# Patient Record
Sex: Female | Born: 1970 | Race: Black or African American | Hispanic: No | Marital: Single | Smoking: Never smoker
Health system: Southern US, Community
[De-identification: ages and names within clinical notes are randomized; demographics above are authoritative.]

## PROBLEM LIST (undated history)

## (undated) DIAGNOSIS — R569 Unspecified convulsions: Secondary | ICD-10-CM

## (undated) DIAGNOSIS — K219 Gastro-esophageal reflux disease without esophagitis: Secondary | ICD-10-CM

## (undated) DIAGNOSIS — I071 Rheumatic tricuspid insufficiency: Secondary | ICD-10-CM

## (undated) DIAGNOSIS — D649 Anemia, unspecified: Secondary | ICD-10-CM

## (undated) DIAGNOSIS — T7840XA Allergy, unspecified, initial encounter: Secondary | ICD-10-CM

## (undated) DIAGNOSIS — G40909 Epilepsy, unspecified, not intractable, without status epilepticus: Secondary | ICD-10-CM

## (undated) DIAGNOSIS — D72829 Elevated white blood cell count, unspecified: Principal | ICD-10-CM

## (undated) DIAGNOSIS — B019 Varicella without complication: Secondary | ICD-10-CM

## (undated) DIAGNOSIS — D563 Thalassemia minor: Secondary | ICD-10-CM

## (undated) HISTORY — PX: DILATION AND CURETTAGE OF UTERUS: SHX78

## (undated) HISTORY — DX: Varicella without complication: B01.9

## (undated) HISTORY — DX: Allergy, unspecified, initial encounter: T78.40XA

## (undated) HISTORY — DX: Anemia, unspecified: D64.9

## (undated) HISTORY — DX: Epilepsy, unspecified, not intractable, without status epilepticus: G40.909

## (undated) HISTORY — DX: Elevated white blood cell count, unspecified: D72.829

## (undated) HISTORY — PX: VARICOSE VEIN SURGERY: SHX832

## (undated) HISTORY — DX: Unspecified convulsions: R56.9

## (undated) HISTORY — DX: Rheumatic tricuspid insufficiency: I07.1

## (undated) HISTORY — DX: Thalassemia minor: D56.3

---

## 2000-09-26 ENCOUNTER — Other Ambulatory Visit: Admission: RE | Admit: 2000-09-26 | Discharge: 2000-09-26 | Payer: Self-pay | Admitting: Obstetrics and Gynecology

## 2000-12-19 ENCOUNTER — Encounter: Payer: Self-pay | Admitting: Obstetrics and Gynecology

## 2000-12-19 ENCOUNTER — Ambulatory Visit (HOSPITAL_COMMUNITY): Admission: RE | Admit: 2000-12-19 | Discharge: 2000-12-19 | Payer: Self-pay | Admitting: Obstetrics and Gynecology

## 2001-05-01 ENCOUNTER — Inpatient Hospital Stay (HOSPITAL_COMMUNITY): Admission: AD | Admit: 2001-05-01 | Discharge: 2001-05-01 | Payer: Self-pay | Admitting: Obstetrics and Gynecology

## 2001-05-05 ENCOUNTER — Encounter (HOSPITAL_COMMUNITY): Admission: RE | Admit: 2001-05-05 | Discharge: 2001-05-05 | Payer: Self-pay | Admitting: Obstetrics and Gynecology

## 2001-05-06 ENCOUNTER — Inpatient Hospital Stay (HOSPITAL_COMMUNITY): Admission: AD | Admit: 2001-05-06 | Discharge: 2001-05-08 | Payer: Self-pay | Admitting: Obstetrics and Gynecology

## 2001-07-23 ENCOUNTER — Other Ambulatory Visit: Admission: RE | Admit: 2001-07-23 | Discharge: 2001-07-23 | Payer: Self-pay | Admitting: Obstetrics and Gynecology

## 2002-11-27 ENCOUNTER — Other Ambulatory Visit: Admission: RE | Admit: 2002-11-27 | Discharge: 2002-11-27 | Payer: Self-pay | Admitting: Obstetrics and Gynecology

## 2004-04-28 ENCOUNTER — Other Ambulatory Visit: Admission: RE | Admit: 2004-04-28 | Discharge: 2004-04-28 | Payer: Self-pay | Admitting: Obstetrics and Gynecology

## 2005-05-25 ENCOUNTER — Other Ambulatory Visit: Admission: RE | Admit: 2005-05-25 | Discharge: 2005-05-25 | Payer: Self-pay | Admitting: Obstetrics and Gynecology

## 2006-08-09 ENCOUNTER — Ambulatory Visit (HOSPITAL_COMMUNITY): Admission: RE | Admit: 2006-08-09 | Discharge: 2006-08-09 | Payer: Self-pay | Admitting: Obstetrics and Gynecology

## 2006-08-09 ENCOUNTER — Encounter (INDEPENDENT_AMBULATORY_CARE_PROVIDER_SITE_OTHER): Payer: Self-pay | Admitting: *Deleted

## 2010-08-15 ENCOUNTER — Emergency Department (HOSPITAL_COMMUNITY)
Admission: EM | Admit: 2010-08-15 | Discharge: 2010-08-15 | Payer: Self-pay | Source: Home / Self Care | Admitting: Emergency Medicine

## 2010-11-14 LAB — URINALYSIS, ROUTINE W REFLEX MICROSCOPIC
Bilirubin Urine: NEGATIVE
Glucose, UA: NEGATIVE mg/dL
Hgb urine dipstick: NEGATIVE
Ketones, ur: NEGATIVE mg/dL
Nitrite: NEGATIVE
Protein, ur: NEGATIVE mg/dL
Specific Gravity, Urine: 1.025 (ref 1.005–1.030)
Urobilinogen, UA: 1 mg/dL (ref 0.0–1.0)
pH: 7.5 (ref 5.0–8.0)

## 2010-11-14 LAB — COMPREHENSIVE METABOLIC PANEL
ALT: 13 U/L (ref 0–35)
Albumin: 3.4 g/dL — ABNORMAL LOW (ref 3.5–5.2)
Alkaline Phosphatase: 65 U/L (ref 39–117)
Glucose, Bld: 95 mg/dL (ref 70–99)
Potassium: 4.1 mEq/L (ref 3.5–5.1)
Sodium: 137 mEq/L (ref 135–145)
Total Protein: 6.9 g/dL (ref 6.0–8.3)

## 2010-11-14 LAB — CBC
HCT: 34.6 % — ABNORMAL LOW (ref 36.0–46.0)
Hemoglobin: 11.7 g/dL — ABNORMAL LOW (ref 12.0–15.0)
MCH: 20.9 pg — ABNORMAL LOW (ref 26.0–34.0)
MCHC: 33.8 g/dL (ref 30.0–36.0)
MCV: 61.8 fL — ABNORMAL LOW (ref 78.0–100.0)
Platelets: 298 10*3/uL (ref 150–400)
RBC: 5.6 MIL/uL — ABNORMAL HIGH (ref 3.87–5.11)
RDW: 14.9 % (ref 11.5–15.5)
WBC: 10.5 10*3/uL (ref 4.0–10.5)

## 2010-11-14 LAB — RAPID URINE DRUG SCREEN, HOSP PERFORMED
Amphetamines: NOT DETECTED
Barbiturates: NOT DETECTED
Benzodiazepines: NOT DETECTED
Cocaine: NOT DETECTED
Opiates: NOT DETECTED
Tetrahydrocannabinol: NOT DETECTED

## 2010-11-14 LAB — URINE MICROSCOPIC-ADD ON

## 2010-11-14 LAB — CK TOTAL AND CKMB (NOT AT ARMC): CK, MB: 1.6 ng/mL (ref 0.3–4.0)

## 2010-11-14 LAB — LIPASE, BLOOD: Lipase: 18 U/L (ref 11–59)

## 2011-01-19 NOTE — H&P (Signed)
Laurie Monroe, Laurie Monroe                ACCOUNT NO.:  [id number]   MEDICAL RECORD NO.:  [id number]          PATIENT TYPE:  AMB   LOCATION:  SDC                           FACILITY:  [facility]   PHYSICIAN:  Zenaida Niece, M.D.DATE OF BIRTH:  13-Aug-1971   DATE OF ADMISSION:  08/09/2006  DATE OF DISCHARGE:                              HISTORY & PHYSICAL   CHIEF COMPLAINT:  Menorrhagia.   HISTORY OF PRESENT ILLNESS:  This is a 40 year old female para 1-0-0-1  whom I saw for an annual exam in October of this year.  At that time she  had been off of birth control pills for approximately 6 months and was  having periods every 1-2 months that were very heavy both on and off the  birth control pill.  Physical exam revealed a mid-planer uterus that was  possibly slightly enlarged.  She had a pelvic ultrasound performed on  November 2 which revealed a small fibroid and a probable endometrial  polyp.  She wishes to preserve fertility and is being admitted for  hysteroscopy with D&C and possible resection of a polyp.   PAST OBSTETRICAL HISTORY:  One vaginal delivery at term in 2002.   PAST MEDICAL HISTORY:  History of seizures as a child.   PAST SURGICAL HISTORY:  Negative.   ALLERGIES:  No known drug allergies.   CURRENT MEDICATIONS:  None.   SOCIAL HISTORY:  She is single and denies alcohol, tobacco or drug use.   FAMILY HISTORY:  No GYN or colon cancer.   REVIEW OF SYSTEMS:  She has normal bowel and bladder function.   PHYSICAL EXAMINATION:  VITAL SIGNS:  Weight is 231 pounds, blood  pressure is 110/80.  GENERAL:  This is a slightly-obese female in no acute distress.  NECK:  Supple without lymphadenopathy or thyromegaly.  LUNGS:  Clear to auscultation.  HEART:  Regular rate and rhythm without murmur.  ABDOMEN:  Soft, nontender, nondistended without palpable masses.  EXTREMITIES:  Have no edema and are nontender.  PELVIC:  External genitalia has no lesions.  On speculum exam, the  cervix  is normal and on bimanual exam the uterus is upper limits of  normal size, mid-planar, and she has no adnexal masses.   ASSESSMENT:  Menorrhagia and a possible endometrial polyp.  Ultrasound  findings have been discussed with the patient.  Again, she wishes to  preserve fertility.  Risks of surgery including bleeding, infection and  damage to surrounding organs, as well as uterine perforation have been  discussed and she understands.   PLAN:  Admit the patient on the day of surgery for hysteroscopy with  dilation and curettage and possible resection.      Zenaida Niece, M.D.  Electronically Signed     TDM/MEDQ  D:  08/08/2006  T:  08/08/2006  Job:  [id number]

## 2011-01-19 NOTE — Op Note (Signed)
Laurie Monroe, Laurie Monroe                ACCOUNT NO.:  [id number]   MEDICAL RECORD NO.:  [id number]          PATIENT TYPE:  AMB   LOCATION:  SDC                           FACILITY:  [facility]   PHYSICIAN:  Zenaida Niece, M.D.DATE OF BIRTH:  08/19/1971   DATE OF PROCEDURE:  08/09/2006  DATE OF DISCHARGE:                               OPERATIVE REPORT   PREOPERATIVE DIAGNOSES:  1. Menorrhagia.  2. Possible endometrial lesion.   POSTOPERATIVE DIAGNOSES:  1. Menorrhagia.  2. Possible endometrial lesion.   PROCEDURE:  Hysteroscopy with dilation and curettage and resection of a  probable endometrial polyp.   SURGEON:  Zenaida Niece, M.D.   ANESTHESIA:  General with an LMA and paracervical block.   SPECIMENS:  Endometrial curettings and resection.   ESTIMATED BLOOD LOSS:  Minimal.   COMPLICATIONS:  None.   FINDINGS:  She had a slightly enlarged uterus and a cervix that was  easily dilated.  She had a normal endometrial cavity, except for a  lesion arising from the left fundus.   PROCEDURE IN DETAIL:  The patient was taken to the operating room and  placed in the dorsal supine position.  General anesthesia was induced  and she was placed in mobile stirrups.  Perineum and vagina were then  prepped and draped in the usual sterile fashion and bladder drained with  a latex-free catheter.  A Graves speculum was inserted into the vagina  and the anterior lip of the cervix grasped with a single-tooth  tenaculum.  Paracervical block was then performed with a total of 16 mL  of 2% plain lidocaine.  Uterus then sounded to 9 cm and the cervix was  easily dilated to a size 17 dilator.  The observer hysteroscope was  inserted and good visualization was achieved.  The patient is  menstruating, so there was some fresh blood in the uterus.  The right  side of the uterine cavity looked normal with a normal tubal ostium.  The left side was obscured by a possible lesion.  The hysteroscope was  removed and sharp curettage was performed with return of a moderate  amount of tissue and some roughness on the left side.  The hysteroscope  was reinserted and there still appears to be lesion on the left side.  The observer hysteroscope was removed and the cervix was further dilated  to a size 23 dilator.  The small resectoscope was then inserted and this  lesion on the left side was resected with a single loop with cutting  current.  It appeared once this was resected, that no further lesion  remained.  All pieces were removed with polyp forceps and with  curettage.  One more look with the hysteroscope revealed a normal  endometrial cavity.  The hysteroscope was removed.  The single-tooth  tenaculum was removed from the cervix and bleeding controlled with  pressure and silver nitrate.  All instruments were then removed from the  vagina.  The patient tolerated the procedure well and was taken to the  recovery room in stable condition.  Counts were correct x2.  Zenaida Niece, M.D.  Electronically Signed     TDM/MEDQ  D:  08/09/2006  T:  08/09/2006  Job:  [id number]

## 2011-01-19 NOTE — Discharge Summary (Signed)
[facility]  Patient:    Laurie Monroe, Laurie Monroe Visit Number: [id number] MRN: [id number]          Service Type: OBS Location: 910A [id number] [id number] Attending Physician:  Oliver Pila Dictated by:   Zenaida Niece, M.D. Admit Date:  05/06/2001 Disc. Date: 05/08/01                             Discharge Summary  ADMISSION DIAGNOSES:          1. Intrauterine pregnancy at 39 weeks.                               2. Preeclampsia.  DISCHARGE DIAGNOSES:          1. Intrauterine pregnancy at 39 weeks.                               2. Preeclampsia.                               3. Meconium stained amniotic fluid.  PROCEDURES:                   Spontaneous vaginal delivery.  COMPLICATIONS:                None.  CONSULTATIONS:                None.  HISTORY AND PHYSICAL:         This is a 40 year old black female, gravida 1, para 0, with an EGA of [redacted] weeks with a due date of September 6 by an LMP consistent with an ultrasound, who presents for augmentation of labor. Blood pressures elevated to the 150s/90s and she now has new 2+ proteinuria consistent with preeclampsia. She has normal PIH labs and is asymptomatic. She has irregular contractions and the cervix on admission was 3+/70/-2.  PRENATAL LABORATORY DATA:     Blood type is O positive with a negative antibody screen, sickle screen negative, RPR nonreactive, rubella immune, hepatitis B surface antigen negative, HIV negative, gonorrhea negative, Chlamydia negative, and group B strep is negative.  PAST MEDICAL HISTORY:         History of seizures as a child. She has been without seizures or medications for greater than 10 years.  PHYSICAL EXAMINATION:         Blood pressures are 150s/90s. Cervix in labor and delivery is 5 cm dilated, 90% effaced with a bulging bag and Dr. Senaida Ores performed artificial rupture of membranes which revealed moderate meconium-stained fluid.  HOSPITAL COURSE:               The patient was admitted as above and had magnesium sulfate started for seizure prophylaxis. She received an amnioinfusion for the meconium-stained fluid. She progressed through labor and reached complete and pushed well, and on the evening of September 3 had a SVD of a vigorous female infant over a second-degree laceration. Apgars were 8 and 9. Weight was 6 pounds 1 ounce. DeLee suction was performed after delivery. Estimated blood loss was 450 cc. There was a mild uterine atony which responded to bimanual massage. Her laceration was repaired with 2-0 Vicryl. She was continued on her magnesium sulfate postpartum, had good urine output and blood pressures  remained stable. Magnesium sulfate was discontinued on the afternoon of September 4. This was postpartum day #1. On postpartum day #2, she again had stable blood pressures, was asymptomatic, was breast and bottle feeding her infant, and was felt to be stable enough for discharge home. Predelivery hemoglobin was 11.5, postdelivery was 10.2.  CONDITION ON DISCHARGE:       Stable.  DISPOSITION:                  Discharge to home.  DISCHARGE INSTRUCTIONS:       Regular diet. Activity is pelvic rest.  DISCHARGE FOLLOWUP:           The patient is to follow up in six weeks.  DISCHARGE MEDICATIONS:        1. Ibuprofen 800 mg p.o. q.6h. p.r.n.                               2. Colace 100 mg p.o. b.i.d. for stool softener. Dictated by:   Zenaida Niece, M.D. Attending Physician:  Oliver Pila DD:  05/08/01 TD:  05/08/01 Job: [id number]

## 2012-07-08 ENCOUNTER — Ambulatory Visit (INDEPENDENT_AMBULATORY_CARE_PROVIDER_SITE_OTHER): Payer: PRIVATE HEALTH INSURANCE | Admitting: Family Medicine

## 2012-07-08 VITALS — BP 108/78 | HR 85 | Temp 98.5°F | Resp 16 | Ht 66.0 in | Wt 222.0 lb

## 2012-07-08 DIAGNOSIS — IMO0002 Reserved for concepts with insufficient information to code with codable children: Secondary | ICD-10-CM

## 2012-07-08 DIAGNOSIS — R569 Unspecified convulsions: Secondary | ICD-10-CM | POA: Insufficient documentation

## 2012-07-08 DIAGNOSIS — R350 Frequency of micturition: Secondary | ICD-10-CM

## 2012-07-08 DIAGNOSIS — S39012A Strain of muscle, fascia and tendon of lower back, initial encounter: Secondary | ICD-10-CM

## 2012-07-08 LAB — POCT URINALYSIS DIPSTICK
Bilirubin, UA: NEGATIVE
Blood, UA: NEGATIVE
Glucose, UA: NEGATIVE
Ketones, UA: NEGATIVE
Leukocytes, UA: NEGATIVE
Nitrite, UA: NEGATIVE
Protein, UA: NEGATIVE
Spec Grav, UA: 1.025
Urobilinogen, UA: 0.2
pH, UA: 6

## 2012-07-08 LAB — POCT UA - MICROSCOPIC ONLY
Bacteria, U Microscopic: NEGATIVE
Casts, Ur, LPF, POC: NEGATIVE
Crystals, Ur, HPF, POC: NEGATIVE
Mucus, UA: NEGATIVE
Yeast, UA: NEGATIVE

## 2012-07-08 MED ORDER — MELOXICAM 7.5 MG PO TABS: 7.5000 mg | ORAL_TABLET | Freq: Every day | ORAL | Status: DC

## 2012-07-08 NOTE — Progress Notes (Signed)
[facility] [address] [place] [address] [phone]  Date:  07/08/2012   Name:  Laurie Monroe   DOB:  10/26/1970   MRN:  [id number]  PCP:  No primary provider on file.    Chief Complaint: Back Pain and Urinary Frequency   History of Present Illness:  Laurie Monroe is a 41 y.o. very pleasant female patient who presents with the following:  She has noted intermittent sharp pains in her right lower back- this has been going on for a couple of weeks.  She has not noted any dysuria or hematuria.   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  She does recall lifting a very heavy patient recently and thinks this could be the source of her pain.   She does not note any exacerbation with any particular poisition or activity.   She has tried ibuprofen so far.   She can have pain run down her right leg sometimes, but no numbness, no tingling, no weakness, no bowel or bladder control issues LMP 2 weeks ago  She had urine checked due to her back pain- however she does not feel that she has a UTI.  She has noted that she sometimes has to urinate frequently but this is not new  There is no problem list on file for this patient.   Past Medical History  Diagnosis Date  . Anemia   . Seizures     No past surgical history on file.  History  Substance Use Topics  . Smoking status: Never Smoker   . Smokeless tobacco: Not on file  . Alcohol Use: No    Family History  Problem Relation Age of Onset  . Hypertension Mother   . Cancer Father   . Hypertension Father   . Hypertension Brother     Allergies no known allergies  Medication list has been reviewed and updated.  No current outpatient prescriptions on file prior to visit.    Review of Systems:  As per HPI- otherwise negative. No abdominal pain   Physical Examination: Filed Vitals:   07/08/12 1405  BP: 108/78  Pulse: 85  Temp: 98.5 F (36.9  C)  Resp: 16   Filed Vitals:   07/08/12 1405  Height: 5\' 6"  (1.676 m)  Weight: 222 lb (100.699 kg)   Body mass index is 35.83 kg/(m^2). Ideal Body Weight: Weight in (lb) to have BMI = 25: 154.6   GEN: WDWN, NAD, Non-toxic, A & O x 3, overweight HEENT: Atraumatic, Normocephalic. Neck supple. No masses, No LAD. Ears and Nose: No external deformity. CV: RRR, No M/G/R. No JVD. No thrill. No extra heart sounds. PULM: CTA B, no wheezes, crackles, rhonchi. No retractions. No resp. distress. No accessory muscle use. EXTR: No c/c/e NEURO Normal gait.  PSYCH: Normally interactive. Conversant. Not depressed or anxious appearing.  Calm demeanor.  Back: she does not have any tenderness currently.  She indicates her lumbar muscles as the area where she has been tender Legs: negative SLR bilaterally, normal strength, sensation and DTR  Results for orders placed in visit on 07/08/12  POCT UA - MICROSCOPIC ONLY      Component Value Range   WBC, Ur, HPF, POC 0-2     RBC, urine, microscopic 0-4     Bacteria, U Microscopic neg     Mucus, UA neg     Epithelial cells, urine per  micros 3-6     Crystals, Ur, HPF, POC neg     Casts, Ur, LPF, POC neg     Yeast, UA neg    POCT URINALYSIS DIPSTICK      Component Value Range   Color, UA yellow     Clarity, UA cloudy     Glucose, UA neg     Bilirubin, UA neg     Ketones, UA neg     Spec Grav, UA 1.025     Blood, UA neg     pH, UA 6.0     Protein, UA neg     Urobilinogen, UA 0.2     Nitrite, UA neg     Leukocytes, UA Negative      Assessment and Plan: 1. Urinary frequency  POCT UA - Microscopic Only, POCT urinalysis dipstick  2. Back strain  meloxicam (MOBIC) 7.5 MG tablet   See patient instructions for more details.     Abbe Amsterdam, MD

## 2012-07-08 NOTE — Patient Instructions (Addendum)
I think that you are strained your back.  Use the mobic (instead of ibuprofen) once a day as needed.  Let me know if you are not better in the next few days

## 2013-02-04 ENCOUNTER — Ambulatory Visit (INDEPENDENT_AMBULATORY_CARE_PROVIDER_SITE_OTHER): Payer: PRIVATE HEALTH INSURANCE | Admitting: Emergency Medicine

## 2013-02-04 VITALS — BP 120/78 | HR 67 | Temp 98.6°F | Resp 16 | Ht 60.0 in | Wt 232.2 lb

## 2013-02-04 DIAGNOSIS — R059 Cough, unspecified: Secondary | ICD-10-CM

## 2013-02-04 DIAGNOSIS — J039 Acute tonsillitis, unspecified: Secondary | ICD-10-CM

## 2013-02-04 DIAGNOSIS — R05 Cough: Secondary | ICD-10-CM

## 2013-02-04 MED ORDER — BENZONATATE 100 MG PO CAPS: 100.0000 mg | ORAL_CAPSULE | Freq: Three times a day (TID) | ORAL | Status: DC | PRN

## 2013-02-04 NOTE — Patient Instructions (Addendum)
Sore Throat A sore throat is pain, burning, irritation, or scratchiness of the throat. There is often pain or tenderness when swallowing or talking. A sore throat may be accompanied by other symptoms, such as coughing, sneezing, fever, and swollen neck glands. A sore throat is often the first sign of another sickness, such as a cold, flu, strep throat, or mononucleosis (commonly known as mono). Most sore throats go away without medical treatment. CAUSES  The most common causes of a sore throat include:  A viral infection, such as a cold, flu, or mono.  A bacterial infection, such as strep throat, tonsillitis, or whooping cough.  Seasonal allergies.  Dryness in the air.  Irritants, such as smoke or pollution.  Gastroesophageal reflux disease (GERD). HOME CARE INSTRUCTIONS   Only take over-the-counter medicines as directed by your caregiver.  Drink enough fluids to keep your urine clear or pale yellow.  Rest as needed.  Try using throat sprays, lozenges, or sucking on hard candy to ease any pain (if older than 4 years or as directed).  Sip warm liquids, such as broth, herbal tea, or warm water with honey to relieve pain temporarily. You may also eat or drink cold or frozen liquids such as frozen ice pops.  Gargle with salt water (mix 1 tsp salt with 8 oz of water).  Do not smoke and avoid secondhand smoke.  Put a cool-mist humidifier in your bedroom at night to moisten the air. You can also turn on a hot shower and sit in the bathroom with the door closed for 5 10 minutes. SEEK IMMEDIATE MEDICAL CARE IF:  You have difficulty breathing.  You are unable to swallow fluids, soft foods, or your saliva.  You have increased swelling in the throat.  Your sore throat does not get better in 7 days.  You have nausea and vomiting.  You have a fever or persistent symptoms for more than 2 3 days.  You have a fever and your symptoms suddenly get worse. MAKE SURE YOU:   Understand  these instructions.  Will watch your condition.  Will get help right away if you are not doing well or get worse. Document Released: 09/27/2004 Document Revised: 08/06/2012 Document Reviewed: 04/27/2012 Exit[employer] Patient Information 2014 [employer].  

## 2013-02-04 NOTE — Progress Notes (Signed)
  Subjective:    Patient ID: Laurie Monroe, female    DOB: November 25, 1970, 42 y.o.   MRN: [id number]  HPI Pt presents to clinic today with a sore throat and cough that started Monday. Pt states cough is nonproductive but bothers her throat when she coughs. 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 Pt reports no fever/chills, abdominal pain, or nausea. Pt has not had any problems this year with seasonal allergies.   Review of Systems     Objective:   Physical Exam patient is alert and cooperative she is not in any distress. Her neck is supple. Her TMs are clear. She has a tonsillith midportion of the left tonsil. Her chest is clear to both auscultation and percussion. Heart regular rate no murmurs.  Results for orders placed in visit on 02/04/13  POCT RAPID STREP A (OFFICE)      Result Value Range   Rapid Strep A Screen Negative  Negative        Assessment & Plan:  Strep culture will be done. Placed on Tylenol Tessalon Perles symptomatic treat

## 2013-02-11 ENCOUNTER — Telehealth: Payer: Self-pay

## 2013-02-11 NOTE — Telephone Encounter (Signed)
PT STATES SHE WAS SEEN FOR A SORE THROAT AND COUGH. HER THROAT ISN'T SORE ANYMORE, BUT THE COUGH MEDICINE SHE WAS GIVEN ISN'T HELPING WOULD LIKE TO HAVE SOMETHING ELSE CALLED IN. PLEASE CALL [phone]    [facility] ON [address]

## 2013-02-12 MED ORDER — GUAIFENESIN-CODEINE 100-10 MG/5ML PO SOLN: ORAL | Status: DC

## 2013-02-12 NOTE — Telephone Encounter (Signed)
Ok to call in Robitussin [facility]. If cough worsens or persists she must RTC for further evaluation.

## 2013-02-13 NOTE — Telephone Encounter (Signed)
Faxed Rx and notified pt on VM 

## 2013-02-27 ENCOUNTER — Ambulatory Visit (INDEPENDENT_AMBULATORY_CARE_PROVIDER_SITE_OTHER): Payer: PRIVATE HEALTH INSURANCE | Admitting: Family Medicine

## 2013-02-27 VITALS — BP 108/72 | HR 89 | Temp 98.2°F | Resp 16 | Ht 66.0 in | Wt 229.0 lb

## 2013-02-27 DIAGNOSIS — J209 Acute bronchitis, unspecified: Secondary | ICD-10-CM

## 2013-02-27 MED ORDER — GUAIFENESIN-CODEINE 100-10 MG/5ML PO SOLN: ORAL | Status: DC

## 2013-02-27 MED ORDER — AZITHROMYCIN 250 MG PO TABS: ORAL_TABLET | ORAL | Status: DC

## 2013-02-27 NOTE — Progress Notes (Signed)
Patient ID: Laurie Monroe MRN: [id number], DOB: [birth year], 42 y.o. Date of Encounter: 02/27/2013, 4:57 PM  Primary Physician: No primary provider on file.  Chief Complaint:  Chief Complaint  Patient presents with  . Cough    was seen on June 4th- cough still not resolved    HPI: 42 y.o. year old female presents with a 25 day history of nasal congestion, post nasal drip, sore throat, and cough. Mild sinus pressure. Afebrile. No chills. Nasal congestion thick and green/yellow. Cough is productive of green/yellow sputum and not associated with time of day. Ears feel full, leading to sensation of muffled hearing. Has tried OTC cold preps without success. No GI complaints.   No sick contacts, recent antibiotics, or recent travels.   No leg trauma, sedentary periods, h/o cancer, or tobacco use.  No asthma  Past Medical History  Diagnosis Date  . Anemia   . Seizures   . Allergy      Home Meds: Prior to Admission medications   Medication Sig Start Date End Date Taking? Authorizing Provider  benzonatate (TESSALON) 100 MG capsule Take 1-2 capsules (100-200 mg total) by mouth 3 (three) times daily as needed for cough. 02/04/13   Collene Gobble, MD  guaiFENesin-codeine 100-10 MG/5ML syrup 1 TSP PO Q 4-6 HOURS PRN COUGH 02/12/13   Sondra Barges, PA-C  meloxicam (MOBIC) 7.5 MG tablet Take 1 tablet (7.5 mg total) by mouth daily. 07/08/12   Pearline Cables, MD    Allergies: No Known Allergies  History   Social History  . Marital Status: Single    Spouse Name: N/A    Number of Children: N/A  . Years of Education: N/A   Occupational History  . CNA    Social History Main Topics  . Smoking status: Never Smoker   . Smokeless tobacco: Not on file  . Alcohol Use: No  . Drug Use: No  . Sexually Active: Not on file   Other Topics Concern  . Not on file   Social History Narrative  . No narrative on file     Review of Systems: Constitutional: negative for chills, fever, night sweats  or weight changes Cardiovascular: negative for chest pain or palpitations Respiratory: negative for hemoptysis, wheezing, or shortness of breath Abdominal: negative for abdominal pain, nausea, vomiting or diarrhea Dermatological: negative for rash Neurologic: negative for headache   Physical Exam: Blood pressure 108/72, pulse 89, temperature 98.2 F (36.8 C), temperature source Oral, resp. rate 16, height 5\' 6"  (1.676 m), weight 229 lb (103.874 kg), last menstrual period 01/15/2013, SpO2 99.00%., Body mass index is 36.98 kg/(m^2). General: Well developed, well nourished, in no acute distress. Head: Normocephalic, atraumatic, eyes without discharge, sclera non-icteric, nares are congested. Bilateral auditory canals clear, TM's are without perforation, pearly grey with reflective cone of light bilaterally. No sinus TTP. Oral cavity moist, dentition normal. Posterior pharynx with post nasal drip and mild erythema. No peritonsillar abscess or tonsillar exudate. Neck: Supple. No thyromegaly. Full ROM. No lymphadenopathy. Lungs: Coarse breath sounds bilaterally without wheezes, rales, or rhonchi. Breathing is unlabored.  Heart: RRR with S1 S2. No murmurs, rubs, or gallops appreciated. Msk:  Strength and tone normal for age. Extremities: No clubbing or cyanosis. No edema. Neuro: Alert and oriented X 3. Moves all extremities spontaneously. CNII-XII grossly in tact. Psych:  Responds to questions appropriately with a normal affect.     ASSESSMENT AND PLAN:  42 y.o. year old female with bronchitis. Acute bronchitis -  Plan: azithromycin (ZITHROMAX Z-PAK) 250 MG tablet, guaiFENesin-codeine 100-10 MG/5ML syrup   - -Mucinex -Tylenol/Motrin prn -Rest/fluids -RTC precautions -RTC 3-5 days if no improvement  Signed, Elvina Sidle, MD 02/27/2013 4:57 PM

## 2013-03-09 ENCOUNTER — Encounter (HOSPITAL_BASED_OUTPATIENT_CLINIC_OR_DEPARTMENT_OTHER): Payer: Self-pay | Admitting: *Deleted

## 2013-03-09 ENCOUNTER — Emergency Department (HOSPITAL_BASED_OUTPATIENT_CLINIC_OR_DEPARTMENT_OTHER): Payer: PRIVATE HEALTH INSURANCE

## 2013-03-09 ENCOUNTER — Emergency Department (HOSPITAL_BASED_OUTPATIENT_CLINIC_OR_DEPARTMENT_OTHER)
Admission: EM | Admit: 2013-03-09 | Discharge: 2013-03-09 | Disposition: A | Discharge status: Home / Self Care | Payer: PRIVATE HEALTH INSURANCE | Attending: Emergency Medicine | Admitting: Emergency Medicine

## 2013-03-09 DIAGNOSIS — Z791 Long term (current) use of non-steroidal anti-inflammatories (NSAID): Secondary | ICD-10-CM | POA: Insufficient documentation

## 2013-03-09 DIAGNOSIS — J40 Bronchitis, not specified as acute or chronic: Secondary | ICD-10-CM

## 2013-03-09 DIAGNOSIS — Z862 Personal history of diseases of the blood and blood-forming organs and certain disorders involving the immune mechanism: Secondary | ICD-10-CM | POA: Insufficient documentation

## 2013-03-09 DIAGNOSIS — Z8669 Personal history of other diseases of the nervous system and sense organs: Secondary | ICD-10-CM | POA: Insufficient documentation

## 2013-03-09 MED ORDER — ALBUTEROL SULFATE HFA 108 (90 BASE) MCG/ACT IN AERS
2.0000 | INHALATION_SPRAY | RESPIRATORY_TRACT | Status: DC | PRN
  Administered 2013-03-09: 2 via RESPIRATORY_TRACT
  Filled 2013-03-09: qty 6.7

## 2013-03-09 NOTE — ED Provider Notes (Signed)
History  This chart was scribed for Ethelda Chick, MD by Ardeen Jourdain, ED Scribe. This patient was seen in room MH01/MH01 and the patient's care was started at 2149.  CSN: [id number] Arrival date & time 03/09/13  2059   First MD Initiated Contact with Patient 03/09/13 2149     Chief Complaint  Patient presents with  . Cough    Patient is a 42 y.o. female presenting with cough. The history is provided by the patient. No language interpreter was used.  Cough Duration:  4 weeks Timing:  Constant Progression:  Worsening Chronicity:  New Smoker: no   Relieved by:  Nothing Worsened by:  Nothing tried Ineffective treatments:  Cough suppressants and rest (Antibiotics) Associated symptoms: no chest pain, no chills, no diaphoresis, no ear fullness, no ear pain, no eye discharge, no fever, no headaches, no myalgias, no rash, no rhinorrhea, no shortness of breath, no sinus congestion, no sore throat, no weight loss and no wheezing     HPI Comments: Laurie Monroe is a 42 y.o. female who presents to the Emergency Department complaining of gradual onset, gradually worsening, constant cough that began 1 month ago. Pt states she was evaluated at [facility] in June and July and was prescribed a Z-pack and cough medication. She states she received the medication a week and a half ago. She denies any known CP, leg swelling, post nasal drip, fever, chills, nausea or emesis. She states the cough is aggravated by heat. She states the medications did not relieve her cough.   Past Medical History  Diagnosis Date  . Anemia   . Seizures   . Allergy    History reviewed. No pertinent past surgical history. Family History  Problem Relation Age of Onset  . Hypertension Mother   . Cancer Father   . Hypertension Father   . Hypertension Brother    History  Substance Use Topics  . Smoking status: Never Smoker   . Smokeless tobacco: Not on file  . Alcohol Use: No   No OB history available.   Review of  Systems  Constitutional: Negative for fever, chills, weight loss and diaphoresis.  HENT: Negative for ear pain, sore throat and rhinorrhea.   Eyes: Negative for discharge.  Respiratory: Positive for cough. Negative for shortness of breath and wheezing.   Cardiovascular: Negative for chest pain.  Musculoskeletal: Negative for myalgias.  Skin: Negative for rash.  Neurological: Negative for headaches.  All other systems reviewed and are negative.    Allergies  Review of patient's allergies indicates no known allergies.  Home Medications   Current Outpatient Rx  Name  Route  Sig  Dispense  Refill  . azithromycin (ZITHROMAX Z-PAK) 250 MG tablet      Take as directed on pack   6 tablet   0   . benzonatate (TESSALON) 100 MG capsule   Oral   Take 1-2 capsules (100-200 mg total) by mouth 3 (three) times daily as needed for cough.   40 capsule   0   . guaiFENesin-codeine 100-10 MG/5ML syrup      1 TSP PO Q 4-6 HOURS PRN COUGH   120 mL   0   . meloxicam (MOBIC) 7.5 MG tablet   Oral   Take 1 tablet (7.5 mg total) by mouth daily.   30 tablet   0    Triage Vitals: BP 129/67  Pulse 83  Temp(Src) 99.1 F (37.3 C) (Oral)  Resp 20  Wt 229 lb (103.874  kg)  BMI 36.98 kg/m2  SpO2 100%  Physical Exam  Nursing note and vitals reviewed. Constitutional: She is oriented to person, place, and time. She appears well-developed and well-nourished. No distress.  HENT:  Head: Normocephalic and atraumatic.  Mouth/Throat: Oropharynx is clear and moist. No oropharyngeal exudate.  Moist mucus membranes   Eyes: EOM are normal. Pupils are equal, round, and reactive to light.  Neck: Normal range of motion. Neck supple. No tracheal deviation present.  Cardiovascular: Normal rate, regular rhythm and normal heart sounds.  Exam reveals no gallop and no friction rub.   No murmur heard. Pulmonary/Chest: Effort normal and breath sounds normal. No respiratory distress. She has no wheezes. She has  no rales. She exhibits no tenderness.  Abdominal: Soft. Bowel sounds are normal. She exhibits no distension. There is no tenderness.  Musculoskeletal: Normal range of motion. She exhibits no edema.  Neurological: She is alert and oriented to person, place, and time.  Skin: Skin is warm and dry.  Psychiatric: She has a normal mood and affect. Her behavior is normal.    ED Course  Procedures (including critical care time)  DIAGNOSTIC STUDIES: Oxygen Saturation is 100% on room air, normal by my interpretation.    COORDINATION OF CARE:  10:05 PM-Discussed treatment plan which includes CXR and an albuterol inhaler with pt at bedside and pt agreed to plan.   Labs Reviewed - No data to display Dg Chest 2 View  03/09/2013   *RADIOLOGY REPORT*  Clinical Data: Dry cough, chest pain.  CHEST - 2 VIEW  Comparison: 08/15/2010  Findings: Heart is borderline in size.  Lungs are clear.  No effusions.  No acute bony abnormality.  IMPRESSION: No acute cardiopulmonary disease.   Original Report Authenticated By: Charlett Nose, M.D.   1. Bronchitis     MDM  Pt presenting with c/o cough which has been present over the past month.  She appears well hydrated and nontoxic.  CXR reassuring. No chest pain. Or shortness of breath.  Suspect a post viral bronchitis.  Given albuterol MDI and discussed symptomatic care.  Discharged with strict return precautions.  Pt agreeable with plan.  I personally performed the services described in this documentation, which was scribed in my presence. The recorded information has been reviewed and is accurate.    Ethelda Chick, MD 03/10/13 [phone]

## 2013-03-09 NOTE — ED Notes (Signed)
Cough for a month. Was seen at [facility] in June and again in July and given a zpak and cough medication. Cough continues.

## 2013-03-21 ENCOUNTER — Ambulatory Visit (INDEPENDENT_AMBULATORY_CARE_PROVIDER_SITE_OTHER): Payer: PRIVATE HEALTH INSURANCE | Admitting: Family Medicine

## 2013-03-21 VITALS — BP 122/82 | Temp 98.0°F | Resp 17 | Ht 66.0 in | Wt 228.0 lb

## 2013-03-21 DIAGNOSIS — K5289 Other specified noninfective gastroenteritis and colitis: Secondary | ICD-10-CM

## 2013-03-21 DIAGNOSIS — R109 Unspecified abdominal pain: Secondary | ICD-10-CM

## 2013-03-21 DIAGNOSIS — K529 Noninfective gastroenteritis and colitis, unspecified: Secondary | ICD-10-CM

## 2013-03-21 LAB — POCT CBC
Lymph, poc: 1.3 (ref 0.6–3.4)
MCH, POC: 21.3 pg — AB (ref 27–31.2)
MCHC: 32.1 g/dL (ref 31.8–35.4)
MID (cbc): 0.4 (ref 0–0.9)
MPV: 9.1 fL (ref 0–99.8)
POC MID %: 4.5 %M (ref 0–12)
Platelet Count, POC: 2879 10*3/uL — AB (ref 142–424)
WBC: 9.6 10*3/uL (ref 4.6–10.2)

## 2013-03-21 LAB — POCT URINALYSIS DIPSTICK
Bilirubin, UA: NEGATIVE
Blood, UA: NEGATIVE
Nitrite, UA: NEGATIVE
Spec Grav, UA: 1.025
pH, UA: 7

## 2013-03-21 LAB — POCT UA - MICROSCOPIC ONLY
Bacteria, U Microscopic: NEGATIVE
Casts, Ur, LPF, POC: NEGATIVE
Crystals, Ur, HPF, POC: NEGATIVE
Epithelial cells, urine per micros: NEGATIVE

## 2013-03-21 MED ORDER — PROMETHAZINE HCL 25 MG PO TABS: 25.0000 mg | ORAL_TABLET | Freq: Three times a day (TID) | ORAL | Status: DC | PRN

## 2013-03-21 MED ORDER — HYDROCODONE-ACETAMINOPHEN 5-325 MG PO TABS: 1.0000 | ORAL_TABLET | Freq: Four times a day (QID) | ORAL | Status: DC | PRN

## 2013-03-21 NOTE — Patient Instructions (Addendum)
Viral Gastroenteritis Viral gastroenteritis is also known as stomach flu. This condition affects the stomach and intestinal tract. It can cause sudden diarrhea and vomiting. The illness typically lasts 3 to 8 days. Most people develop an immune response that eventually gets rid of the virus. While this natural response develops, the virus can make you quite ill. CAUSES  Many different viruses can cause gastroenteritis, such as rotavirus or noroviruses. You can catch one of these viruses by consuming contaminated food or water. You may also catch a virus by sharing utensils or other personal items with an infected person or by touching a contaminated surface. SYMPTOMS  The most common symptoms are diarrhea and vomiting. These problems can cause a severe loss of body fluids (dehydration) and a body salt (electrolyte) imbalance. Other symptoms may include:  Fever.  Headache.  Fatigue.  Abdominal pain. DIAGNOSIS  Your caregiver can usually diagnose viral gastroenteritis based on your symptoms and a physical exam. A stool sample may also be taken to test for the presence of viruses or other infections. TREATMENT  This illness typically goes away on its own. Treatments are aimed at rehydration. The most serious cases of viral gastroenteritis involve vomiting so severely that you are not able to keep fluids down. In these cases, fluids must be given through an intravenous line (IV). HOME CARE INSTRUCTIONS   Drink enough fluids to keep your urine clear or pale yellow. Drink small amounts of fluids frequently and increase the amounts as tolerated.  Ask your caregiver for specific rehydration instructions.  Avoid:  Foods high in sugar.  Alcohol.  Carbonated drinks.  Tobacco.  Juice.  Caffeine drinks.  Extremely hot or cold fluids.  Fatty, greasy foods.  Too much intake of anything at one time.  Dairy products until 24 to 48 hours after diarrhea stops.  You may consume probiotics.  Probiotics are active cultures of beneficial bacteria. They may lessen the amount and number of diarrheal stools in adults. Probiotics can be found in yogurt with active cultures and in supplements.  Wash your hands well to avoid spreading the virus.  Only take over-the-counter or prescription medicines for pain, discomfort, or fever as directed by your caregiver. Do not give aspirin to children. Antidiarrheal medicines are not recommended.  Ask your caregiver if you should continue to take your regular prescribed and over-the-counter medicines.  Keep all follow-up appointments as directed by your caregiver. SEEK IMMEDIATE MEDICAL CARE IF:   You are unable to keep fluids down.  You do not urinate at least once every 6 to 8 hours.  You develop shortness of breath.  You notice blood in your stool or vomit. This may look like coffee grounds.  You have abdominal pain that increases or is concentrated in one small area (localized).  You have persistent vomiting or diarrhea.  You have a fever.  The patient is a child younger than 3 months, and he or she has a fever.  The patient is a child older than 3 months, and he or she has a fever and persistent symptoms.  The patient is a child older than 3 months, and he or she has a fever and symptoms suddenly get worse.  The patient is a baby, and he or she has no tears when crying. MAKE SURE YOU:   Understand these instructions.  Will watch your condition.  Will get help right away if you are not doing well or get worse. Document Released: 08/20/2005 Document Revised: 11/12/2011 Document Reviewed: 06/06/2011   ExitCare Patient Information 2014 [employer].  

## 2013-03-21 NOTE — Progress Notes (Signed)
Subjective:    Patient ID: Laurie Monroe, female    DOB: [birth year], 42 y.o.   MRN: [id number] Chief Complaint  Patient presents with  . Abdominal Pain  . Diarrhea  . Emesis    HPI  Woke up this morning with periumbilical abd pain, then she began vomiting followed by some loose stools.  The emesis recurred, tried to go to work anyway but abd pain and nausea kept her from staying so came straight here. No fevers/chills.  Did drank some ginger ale which she was able to keep down. No dysuria or frequency.  Emesis was dinner and bile but no blood of coffee grounds. No melena or hematochezia. No h/o any abd surgies.  No h/o any abd problems like this prior.  Past Medical History  Diagnosis Date  . Anemia   . Seizures   . Allergy    No current outpatient prescriptions on file prior to visit.   No current facility-administered medications on file prior to visit.   No Known Allergies   Review of Systems    BP 122/82  Temp(Src) 98 F (36.7 C) (Oral)  Resp 17  Ht 5\' 6"  (1.676 m)  Wt 228 lb (103.42 kg)  BMI 36.82 kg/m2  LMP 03/19/2013 Objective:   Physical Exam        Results for orders placed in visit on 03/21/13  POCT CBC      Result Value Range   WBC 9.6  4.6 - 10.2 K/uL   Lymph, poc 1.3  0.6 - 3.4   POC LYMPH PERCENT 13.7  10 - 50 %L   MID (cbc) 0.4  0 - 0.9   POC MID % 4.5  0 - 12 %M   POC Granulocyte 7.9 (*) 2 - 6.9   Granulocyte percent 81.8 (*) 37 - 80 %G   RBC 5.08  4.04 - 5.48 M/uL   Hemoglobin 10.8 (*) 12.2 - 16.2 g/dL   HCT, POC 40.9 (*) 81.1 - 47.9 %   MCV 66.1 (*) 80 - 97 fL   MCH, POC 21.3 (*) 27 - 31.2 pg   MCHC 32.1  31.8 - 35.4 g/dL   RDW, POC 91.4     Platelet Count, POC 2879 (*) 142 - 424 K/uL   MPV 9.1  0 - 99.8 fL  POCT URINALYSIS DIPSTICK      Result Value Range   Color, UA yellow     Clarity, UA clear     Glucose, UA neg     Bilirubin, UA neg     Ketones, UA neg     Spec Grav, UA 1.025     Blood, UA neg     pH, UA 7.0     Protein,  UA neg     Urobilinogen, UA 1.0     Nitrite, UA neg     Leukocytes, UA Negative    POCT UA - MICROSCOPIC ONLY      Result Value Range   WBC, Ur, HPF, POC 0-6     RBC, urine, microscopic 0-2     Bacteria, U Microscopic neg     Mucus, UA neg     Epithelial cells, urine per micros neg     Crystals, Ur, HPF, POC neg     Casts, Ur, LPF, POC neg     Yeast, UA neg      Assessment & Plan:  Abdominal  pain, other specified site - Plan: POCT CBC, POCT urinalysis dipstick, POCT UA -  Microscopic Only  Noninfectious gastroenteritis and colitis  Meds ordered this encounter  Medications  . albuterol (PROVENTIL) (2.5 MG/3ML) 0.083% nebulizer solution    Sig: Take 2.5 mg by nebulization every 6 (six) hours as needed for wheezing.  . promethazine (PHENERGAN) 25 MG tablet    Sig: Take 1 tablet (25 mg total) by mouth every 8 (eight) hours as needed for nausea.    Dispense:  20 tablet    Refill:  0  . HYDROcodone-acetaminophen (NORCO) 5-325 MG per tablet    Sig: Take 1 tablet by mouth every 6 (six) hours as needed for pain.    Dispense:  10 tablet    Refill:  0

## 2013-09-01 ENCOUNTER — Encounter: Payer: Self-pay | Admitting: Podiatry

## 2013-09-01 ENCOUNTER — Ambulatory Visit (INDEPENDENT_AMBULATORY_CARE_PROVIDER_SITE_OTHER): Payer: PRIVATE HEALTH INSURANCE | Admitting: Podiatry

## 2013-09-01 VITALS — BP 122/76 | HR 79 | Ht 66.0 in | Wt 235.0 lb

## 2013-09-01 DIAGNOSIS — B351 Tinea unguium: Secondary | ICD-10-CM | POA: Insufficient documentation

## 2013-09-01 DIAGNOSIS — M79609 Pain in unspecified limb: Secondary | ICD-10-CM

## 2013-09-01 DIAGNOSIS — M79676 Pain in unspecified toe(s): Secondary | ICD-10-CM | POA: Insufficient documentation

## 2013-09-01 MED ORDER — EFINACONAZOLE 10 % EX SOLN: 1.0000 | Freq: Every morning | CUTANEOUS | Status: DC

## 2013-09-01 NOTE — Progress Notes (Signed)
Seen for fungal nails. They are improving Been using Penlac. Last time she used Penlac was in July.   Objective: Thick fungal infected both great toe nails mostly at distal 1/4 of the plate.  Proximal 3/4 of nail plate is clear and thin.  Proximal skin folders have white fungal debris build up. No new problems. Neurovascular status are within normal.  Assessment: Improved fungal nail plate both great toe nails.  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  Plan: Reviewed findings. Continue to use antifungal topical medication.  Rx. Jublia sent.

## 2013-09-01 NOTE — Patient Instructions (Signed)
Seen for mycotic nails. New topical medication prescribed. Apply to affected nail daily. All nails debrided. Return as needed.

## 2013-10-09 ENCOUNTER — Ambulatory Visit (INDEPENDENT_AMBULATORY_CARE_PROVIDER_SITE_OTHER): Payer: PRIVATE HEALTH INSURANCE | Admitting: Family Medicine

## 2013-10-09 VITALS — BP 112/68 | HR 93 | Temp 98.8°F | Resp 16 | Ht 66.0 in | Wt 226.8 lb

## 2013-10-09 DIAGNOSIS — J09X2 Influenza due to identified novel influenza A virus with other respiratory manifestations: Secondary | ICD-10-CM

## 2013-10-09 DIAGNOSIS — R112 Nausea with vomiting, unspecified: Secondary | ICD-10-CM

## 2013-10-09 MED ORDER — OSELTAMIVIR PHOSPHATE 75 MG PO CAPS
75.0000 mg | ORAL_CAPSULE | Freq: Two times a day (BID) | ORAL | Status: DC
Start: 2013-10-09 — End: 2013-10-31

## 2013-10-09 MED ORDER — HYDROCODONE-HOMATROPINE 5-1.5 MG/5ML PO SYRP: 5.0000 mL | ORAL_SOLUTION | ORAL | Status: DC | PRN

## 2013-10-09 MED ORDER — ONDANSETRON 4 MG PO TBDP: 4.0000 mg | ORAL_TABLET | Freq: Three times a day (TID) | ORAL | Status: DC | PRN

## 2013-10-09 NOTE — Patient Instructions (Signed)
Stay off work until Monday  Drink plenty of fluids and get enough rest  Take Tamiflu twice daily  Use the cough syrup 1 teaspoon every 4-6 hours as needed for cough  You can take an over-the-counter antihistamine decongestant if needed for head congestion, such as Claritin-D or Allegra-D.  Return if acutely worse

## 2013-10-09 NOTE — Progress Notes (Signed)
Subjective: Pleasant lady who has been ill since Wednesday (this is Friday). She has had cough, nausea and vomiting a couple times yesterday, and it dropped to 101 the last 2 days with lots of fever and chills. She has body aches. She's had some loose stools. She did get her flu shot.  Objective: Doesn't look like she feels well. TMs are normal. Throat clear. Neck supple without significant nodes. Chest is clear to auscultation. Heart regular without murmurs. And soft without mass or tenderness.  Assessment: Influenza  Plan: Decided not to do a flu test as it would not change my intending treatment. Hycodan, Zofran, Tamiflu, ibuprofen or Tylenol, fluids, rest

## 2013-10-13 ENCOUNTER — Ambulatory Visit (INDEPENDENT_AMBULATORY_CARE_PROVIDER_SITE_OTHER): Payer: PRIVATE HEALTH INSURANCE | Admitting: Family Medicine

## 2013-10-13 VITALS — BP 116/62 | HR 67 | Temp 97.3°F | Resp 18 | Ht 65.5 in | Wt 227.0 lb

## 2013-10-13 DIAGNOSIS — J209 Acute bronchitis, unspecified: Secondary | ICD-10-CM

## 2013-10-13 DIAGNOSIS — J09X2 Influenza due to identified novel influenza A virus with other respiratory manifestations: Secondary | ICD-10-CM

## 2013-10-13 DIAGNOSIS — R6889 Other general symptoms and signs: Secondary | ICD-10-CM

## 2013-10-13 LAB — POCT CBC
Granulocyte percent: 67.6 %G (ref 37–80)
HCT, POC: 34.9 % — AB (ref 37.7–47.9)
Hemoglobin: 11 g/dL — AB (ref 12.2–16.2)
Lymph, poc: 3.1 (ref 0.6–3.4)
MCH: 20.6 pg — AB (ref 27–31.2)
MCHC: 31.5 g/dL — AB (ref 31.8–35.4)
MCV: 65.4 fL — AB (ref 80–97)
MID (CBC): 0.8 (ref 0–0.9)
MPV: 9.4 fL (ref 0–99.8)
PLATELET COUNT, POC: 380 10*3/uL (ref 142–424)
POC Granulocyte: 8 — AB (ref 2–6.9)
POC LYMPH %: 26 % (ref 10–50)
POC MID %: 6.4 % (ref 0–12)
RBC: 5.34 M/uL (ref 4.04–5.48)
RDW, POC: 16 %
WBC: 11.8 10*3/uL — AB (ref 4.6–10.2)

## 2013-10-13 MED ORDER — HYDROCODONE-HOMATROPINE 5-1.5 MG/5ML PO SYRP: 5.0000 mL | ORAL_SOLUTION | ORAL | Status: DC | PRN

## 2013-10-13 MED ORDER — AZITHROMYCIN 250 MG PO TABS: ORAL_TABLET | ORAL | Status: DC

## 2013-10-13 MED ORDER — BENZONATATE 100 MG PO CAPS
100.0000 mg | ORAL_CAPSULE | Freq: Three times a day (TID) | ORAL | Status: DC | PRN
Start: 2013-10-13 — End: 2014-07-24

## 2013-10-13 NOTE — Progress Notes (Signed)
Subjective: Patient was seen last week with a typical flulike illness. Because of the classic illness no testing was done at that time. She was treated with cough syrup and Tamiflu other symptomatic treatment. Today is her last day of the Tamiflu. She is continued to take and feel bad. She doesn't have an earache or sore throat. She has minimal nasal congestion. She has a mild cough. The body just aches. She does not smoke. 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 She has not worked since last Thursday. This is Tuesday.  Objective: Somewhat ill-appearing lady. Tachycardia cough. TMs normal. Throat clear. Neck supple without nodes. Chest clear. Heart regular without murmurs.  Assessment: Persistent flulike illness Cough Body aches  Plan: Check a CBC  Results for orders placed in visit on 10/13/13  POCT CBC      Result Value Range   WBC 11.8 (*) 4.6 - 10.2 K/uL   Lymph, poc 3.1  0.6 - 3.4   POC LYMPH PERCENT 26.0  10 - 50 %L   MID (cbc) 0.8  0 - 0.9   POC MID % 6.4  0 - 12 %M   POC Granulocyte 8.0 (*) 2 - 6.9   Granulocyte percent 67.6  37 - 80 %G   RBC 5.34  4.04 - 5.48 M/uL   Hemoglobin 11.0 (*) 12.2 - 16.2 g/dL   HCT, POC 34.9 (*) 37.7 - 47.9 %   MCV 65.4 (*) 80 - 97 fL   MCH, POC 20.6 (*) 27 - 31.2 pg   MCHC 31.5 (*) 31.8 - 35.4 g/dL   RDW, POC 16.0     Platelet Count, POC 380  142 - 424 K/uL   MPV 9.4  0 - 99.8 fL

## 2013-10-13 NOTE — Patient Instructions (Signed)
Continue to drink plenty of fluids and try to get enough rest.  Hopefully you'll be able to return to work Thursday.   Use the cough syrup or Tessalon(benzonatate) for cough as needed  Return if continued to not improve

## 2013-10-31 ENCOUNTER — Ambulatory Visit (INDEPENDENT_AMBULATORY_CARE_PROVIDER_SITE_OTHER): Payer: PRIVATE HEALTH INSURANCE | Admitting: Family Medicine

## 2013-10-31 ENCOUNTER — Ambulatory Visit: Payer: PRIVATE HEALTH INSURANCE

## 2013-10-31 VITALS — BP 136/84 | HR 90 | Temp 99.0°F | Resp 16 | Ht 66.0 in | Wt 229.6 lb

## 2013-10-31 DIAGNOSIS — R05 Cough: Secondary | ICD-10-CM

## 2013-10-31 DIAGNOSIS — R059 Cough, unspecified: Secondary | ICD-10-CM

## 2013-10-31 LAB — POCT CBC
GRANULOCYTE PERCENT: 68.8 % (ref 37–80)
HEMATOCRIT: 37.3 % — AB (ref 37.7–47.9)
HEMOGLOBIN: 11.6 g/dL — AB (ref 12.2–16.2)
Lymph, poc: 1.2 (ref 0.6–3.4)
MCH, POC: 20.4 pg — AB (ref 27–31.2)
MCHC: 31.1 g/dL — AB (ref 31.8–35.4)
MCV: 65.6 fL — AB (ref 80–97)
MID (cbc): 0.8 (ref 0–0.9)
MPV: 10 fL (ref 0–99.8)
POC GRANULOCYTE: 4.5 (ref 2–6.9)
POC LYMPH PERCENT: 18.8 %L (ref 10–50)
POC MID %: 12.4 %M — AB (ref 0–12)
Platelet Count, POC: 359 10*3/uL (ref 142–424)
RBC: 5.69 M/uL — AB (ref 4.04–5.48)
RDW, POC: 16.2 %
WBC: 6.5 10*3/uL (ref 4.6–10.2)

## 2013-10-31 MED ORDER — HYDROCOD POLST-CHLORPHEN POLST 10-8 MG/5ML PO LQCR: 5.0000 mL | Freq: Two times a day (BID) | ORAL | Status: DC

## 2013-10-31 MED ORDER — ALBUTEROL SULFATE HFA 108 (90 BASE) MCG/ACT IN AERS: 2.0000 | INHALATION_SPRAY | Freq: Four times a day (QID) | RESPIRATORY_TRACT | Status: DC | PRN

## 2013-10-31 MED ORDER — AZITHROMYCIN 250 MG PO TABS: ORAL_TABLET | ORAL | Status: AC

## 2013-10-31 MED ORDER — GUAIFENESIN ER 1200 MG PO TB12: 1.0000 | ORAL_TABLET | Freq: Two times a day (BID) | ORAL | Status: AC

## 2013-10-31 NOTE — Progress Notes (Signed)
Subjective:    Patient ID: Laurie Monroe, female    DOB: 03-15-1971, 43 y.o.   MRN: [id number]  HPI Pt presents to clinic with continued cough for the last month that is dry.  She has minimal congestion with little rhinorrhea.  She was diagnosed with presumed flu by symptoms early Feb and she got better except for the cough until 3 days ago when she started to developed myalgias again and had 2 episodes of vomiting but she thinks they were related to her coughing so hard.  She has some wheezing but no SOB or chest tightness.  She has had to have an inhaler before for bronchitis.  She also has a headache that is at the temple region, no face pain or dizziness.  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  HENT: Negative for congestion, postnasal drip, rhinorrhea and sore throat.   Respiratory: Positive for cough (dry) and wheezing.        No h/o asthma, no smoker  Gastrointestinal: Positive for vomiting (she thinks post-tussive) and diarrhea (loose).  Musculoskeletal: Positive for myalgias.  Neurological: Positive for headaches.       Objective:   Physical Exam  Vitals reviewed. Constitutional: She is oriented to person, place, and time. She appears well-developed and well-nourished.  HENT:  Head: Normocephalic and atraumatic.  Right Ear: Hearing, tympanic membrane, external ear and ear canal normal.  Left Ear: Hearing, tympanic membrane, external ear and ear canal normal.  Nose: Mucosal edema (red) present.  Mouth/Throat: Uvula is midline, oropharynx is clear and moist and mucous membranes are normal.  Eyes: Conjunctivae are normal.  Neck: Normal range of motion.  Cardiovascular: Normal rate, regular rhythm and normal heart sounds.   No murmur heard. Pulmonary/Chest: Effort normal and  breath sounds normal. She has no wheezes.  Lymphadenopathy:    She has no cervical adenopathy.  Neurological: She is alert and oriented to person, place, and time.  Skin: Skin is warm and dry.  Psychiatric: She has a normal mood and affect. Her behavior is normal. Judgment and thought content normal.   [facility] reading (PRIMARY) by  Dr. Joseph Art.  Poor inspiration.  Increased markings, no obvious infiltrate.  Results for orders placed in visit on 10/31/13  POCT CBC      Result Value Ref Range   WBC 6.5  4.6 - 10.2 K/uL   Lymph, poc 1.2  0.6 - 3.4   POC LYMPH PERCENT 18.8  10 - 50 %L   MID (cbc) 0.8  0 - 0.9   POC MID % 12.4 (*) 0 - 12 %M   POC Granulocyte 4.5  2 - 6.9   Granulocyte percent 68.8  37 - 80 %G   RBC 5.69 (*) 4.04 - 5.48 M/uL   Hemoglobin 11.6 (*) 12.2 - 16.2 g/dL   HCT, POC 37.3 (*) 37.7 - 47.9 %   MCV 65.6 (*) 80 - 97 fL   MCH, POC 20.4 (*) 27 - 31.2 pg   MCHC 31.1 (*) 31.8 - 35.4 g/dL   RDW, POC 16.2     Platelet Count, POC 359  142 - 424 K/uL   MPV 10.0  0 - 99.8 fL        Assessment & Plan:  Cough - Plan: POCT CBC, DG Chest 2 View, Guaifenesin (  MUCINEX MAXIMUM STRENGTH) 1200 MG TB12, azithromycin (ZITHROMAX Z-PAK) 250 MG tablet, chlorpheniramine-HYDROcodone (TUSSIONEX PENNKINETIC ER) 10-8 MG/5ML LQCR, albuterol (PROVENTIL HFA;VENTOLIN HFA) 108 (90 BASE) MCG/ACT inhaler  Due to the length of time of her cough will cover her for atypical bacteria.  We will try a different medication for her cough to see if it lasts longer and get her started on some mucinex to help thin out the mucus to stop the likely PND.  Due to her wheezing history will send her home with albuterol to see if that will help with her cough.  She will push fluids.  Windell Hummingbird PA-C 10/31/2013 5:47 PM

## 2013-11-05 ENCOUNTER — Telehealth: Payer: Self-pay

## 2013-11-05 DIAGNOSIS — R05 Cough: Secondary | ICD-10-CM

## 2013-11-05 DIAGNOSIS — R059 Cough, unspecified: Secondary | ICD-10-CM

## 2013-11-05 NOTE — Telephone Encounter (Signed)
Patient needs a refill on hydrocodone.   [phone]

## 2013-11-06 MED ORDER — HYDROCOD POLST-CHLORPHEN POLST 10-8 MG/5ML PO LQCR: 5.0000 mL | Freq: Two times a day (BID) | ORAL | Status: AC

## 2013-11-06 NOTE — Telephone Encounter (Signed)
Rx printed.  Meds ordered this encounter  Medications  . chlorpheniramine-HYDROcodone (TUSSIONEX PENNKINETIC ER) 10-8 MG/5ML LQCR    Sig: Take 5 mLs by mouth every 12 (twelve) hours.    Dispense:  70 mL    Refill:  0    Order Specific Question:  Supervising Provider    Answer:  DOOLITTLE, ROBERT P [[id number]]

## 2013-11-06 NOTE — Telephone Encounter (Signed)
Looks like we gave her Tussionex for her cough on 2/28. LMOM to CB to get more details about why she needs a RF

## 2013-11-06 NOTE — Telephone Encounter (Signed)
Pt states that she has only a little cough syrup left and still having a cough.  No other symptoms.

## 2013-11-07 NOTE — Telephone Encounter (Signed)
Pt aware of script- placed in pu drawer

## 2013-12-15 ENCOUNTER — Other Ambulatory Visit: Payer: Self-pay | Admitting: Obstetrics and Gynecology

## 2013-12-15 DIAGNOSIS — N6315 Unspecified lump in the right breast, overlapping quadrants: Secondary | ICD-10-CM

## 2013-12-15 DIAGNOSIS — N6310 Unspecified lump in the right breast, unspecified quadrant: Secondary | ICD-10-CM

## 2013-12-15 DIAGNOSIS — N644 Mastodynia: Secondary | ICD-10-CM

## 2013-12-25 ENCOUNTER — Encounter (INDEPENDENT_AMBULATORY_CARE_PROVIDER_SITE_OTHER): Payer: Self-pay

## 2013-12-25 ENCOUNTER — Ambulatory Visit
Admission: RE | Admit: 2013-12-25 | Discharge: 2013-12-25 | Disposition: A | Discharge status: Home / Self Care | Payer: PRIVATE HEALTH INSURANCE | Source: Ambulatory Visit | Attending: Obstetrics and Gynecology | Admitting: Obstetrics and Gynecology

## 2013-12-25 DIAGNOSIS — N644 Mastodynia: Secondary | ICD-10-CM

## 2013-12-25 DIAGNOSIS — N6315 Unspecified lump in the right breast, overlapping quadrants: Secondary | ICD-10-CM

## 2013-12-25 DIAGNOSIS — N6310 Unspecified lump in the right breast, unspecified quadrant: Secondary | ICD-10-CM

## 2014-03-10 ENCOUNTER — Ambulatory Visit (INDEPENDENT_AMBULATORY_CARE_PROVIDER_SITE_OTHER): Payer: PRIVATE HEALTH INSURANCE | Admitting: Physician Assistant

## 2014-03-10 VITALS — BP 112/84 | HR 81 | Temp 98.7°F | Resp 18 | Ht 66.5 in | Wt 245.0 lb

## 2014-03-10 DIAGNOSIS — R0981 Nasal congestion: Secondary | ICD-10-CM

## 2014-03-10 DIAGNOSIS — J069 Acute upper respiratory infection, unspecified: Secondary | ICD-10-CM

## 2014-03-10 DIAGNOSIS — J3489 Other specified disorders of nose and nasal sinuses: Secondary | ICD-10-CM

## 2014-03-10 MED ORDER — IPRATROPIUM BROMIDE 0.03 % NA SOLN: 2.0000 | Freq: Two times a day (BID) | NASAL | Status: DC

## 2014-03-10 MED ORDER — PREDNISONE 10 MG PO TABS: ORAL_TABLET | ORAL | Status: DC

## 2014-03-10 NOTE — Progress Notes (Signed)
   Subjective:    Patient ID: Laurie Monroe, female    DOB: [birth year], 43 y.o.   MRN: [id number]  HPI 43 year old female presents for evaluation of 2 day history of stuffy nose and nasal congestion. States she is having difficulty sleeping due to not being able to breathe well. No nasal discharge.  Denies sore throat, cough, otalgia, headache, sinus pain, dizziness, fever, or chills.  Has taken OTC nyquil which has not helped.    Review of Systems  Constitutional: Negative for fever and chills.  HENT: Positive for congestion. Negative for ear pain, postnasal drip, rhinorrhea, sinus pressure and sore throat.   Respiratory: Negative for cough.   Gastrointestinal: Negative for nausea and vomiting.  Neurological: Negative for dizziness and headaches.       Objective:   Physical Exam  Constitutional: She is oriented to person, place, and time. She appears well-developed and well-nourished.  HENT:  Head: Normocephalic and atraumatic.  Right Ear: Hearing, tympanic membrane, external ear and ear canal normal.  Left Ear: Hearing, tympanic membrane, external ear and ear canal normal.  Mouth/Throat: Uvula is midline, oropharynx is clear and moist and mucous membranes are normal.  Eyes: Conjunctivae are normal.  Neck: Normal range of motion. Neck supple.  Cardiovascular: Normal rate, regular rhythm and normal heart sounds.   Pulmonary/Chest: Effort normal and breath sounds normal.  Lymphadenopathy:    She has no cervical adenopathy.  Neurological: She is alert and oriented to person, place, and time.  Psychiatric: She has a normal mood and affect. Her behavior is normal. Judgment and thought content normal.          Assessment & Plan:  Viral URI - Plan: ipratropium (ATROVENT) 0.03 % nasal spray, predniSONE (DELTASONE) 10 MG tablet  Nasal congestion - Plan: ipratropium (ATROVENT) 0.03 % nasal spray, predniSONE (DELTASONE) 10 MG tablet  Will treat symptomatically with Atrovent NS twice  daily to help with congestion Prednisone taper. Ok to continue Nyquil if helpful.  Push fluids. Out of work note provided RTC precautions discussed. Follow up if symptoms worsen or fail to improve.

## 2014-03-12 ENCOUNTER — Telehealth: Payer: Self-pay

## 2014-03-12 DIAGNOSIS — J09X2 Influenza due to identified novel influenza A virus with other respiratory manifestations: Secondary | ICD-10-CM

## 2014-03-12 MED ORDER — PSEUDOEPHEDRINE-GUAIFENESIN ER 60-600 MG PO TB12: 1.0000 | ORAL_TABLET | Freq: Two times a day (BID) | ORAL | Status: DC

## 2014-03-12 MED ORDER — BENZONATATE 100 MG PO CAPS: 100.0000 mg | ORAL_CAPSULE | Freq: Three times a day (TID) | ORAL | Status: DC | PRN

## 2014-03-12 MED ORDER — HYDROCODONE-HOMATROPINE 5-1.5 MG/5ML PO SYRP: 5.0000 mL | ORAL_SOLUTION | Freq: Three times a day (TID) | ORAL | Status: DC | PRN

## 2014-03-12 NOTE — Telephone Encounter (Signed)
Left message on machine to call back to get more details.

## 2014-03-12 NOTE — Telephone Encounter (Signed)
Pt wants to know if we can switch from tessalon perles to a cough syrup. She says she's tried the perles in the past without success.

## 2014-03-12 NOTE — Telephone Encounter (Signed)
Coughing more, HA, still congested. Prednisone and atrovent not halping. Has been taking like prescribed. Please advise. Thanks

## 2014-03-12 NOTE — Telephone Encounter (Signed)
Yes.  I have printed an rx for cough syrup - she will have to come pick it up as it is a controlled substance

## 2014-03-12 NOTE — Telephone Encounter (Signed)
Laurie Monroe called stating she saw Laurie Monroe on 03/10/14 for her illness. Laurie Monroe is having stronger symptoms and wants to be prescribed an antibiotic. Please advise at [phone].

## 2014-03-12 NOTE — Telephone Encounter (Signed)
LMOM that rx is ready for p/u 

## 2014-03-12 NOTE — Telephone Encounter (Signed)
I have sent Tessalon Perles for cough and Mucinex D for congestion.  Make sure she is drinking plenty of water.  Ibuprofen or Tylenol for HA.  Viral infections like this usually take 7-10 days to resolve.  Does she need a note for work?

## 2014-03-12 NOTE — Telephone Encounter (Signed)
Left message on machine to call back  

## 2014-07-24 ENCOUNTER — Ambulatory Visit (INDEPENDENT_AMBULATORY_CARE_PROVIDER_SITE_OTHER): Payer: PRIVATE HEALTH INSURANCE | Admitting: Family Medicine

## 2014-07-24 ENCOUNTER — Ambulatory Visit (HOSPITAL_BASED_OUTPATIENT_CLINIC_OR_DEPARTMENT_OTHER)
Admission: RE | Admit: 2014-07-24 | Discharge: 2014-07-24 | Disposition: A | Discharge status: Home / Self Care | Payer: PRIVATE HEALTH INSURANCE | Source: Ambulatory Visit | Attending: Family Medicine | Admitting: Family Medicine

## 2014-07-24 ENCOUNTER — Encounter: Payer: Self-pay | Admitting: Family Medicine

## 2014-07-24 VITALS — BP 108/74 | HR 93 | Temp 99.1°F | Resp 24 | Ht 65.5 in | Wt 239.5 lb

## 2014-07-24 DIAGNOSIS — R2241 Localized swelling, mass and lump, right lower limb: Secondary | ICD-10-CM | POA: Diagnosis present

## 2014-07-24 DIAGNOSIS — I809 Phlebitis and thrombophlebitis of unspecified site: Secondary | ICD-10-CM

## 2014-07-24 DIAGNOSIS — M25561 Pain in right knee: Secondary | ICD-10-CM

## 2014-07-24 DIAGNOSIS — I82811 Embolism and thrombosis of superficial veins of right lower extremities: Secondary | ICD-10-CM | POA: Insufficient documentation

## 2014-07-24 MED ORDER — ASPIRIN EC 81 MG PO TBEC: 81.0000 mg | DELAYED_RELEASE_TABLET | Freq: Every day | ORAL | Status: DC

## 2014-07-24 MED ORDER — DICLOFENAC SODIUM 75 MG PO TBEC: 75.0000 mg | DELAYED_RELEASE_TABLET | Freq: Two times a day (BID) | ORAL | Status: DC

## 2014-07-24 NOTE — Progress Notes (Addendum)
Subjective:    Patient ID: Laurie Monroe, female    DOB: 1970/09/24, 43 y.o.   MRN: [id number] This chart was scribed for Dr. Robyn Haber by Steva Colder, ED Scribe. The patient was seen in room 9 at 2:47 PM.   Chief Complaint  Patient presents with   Chest Pain    Last Night-Didn't last long and no CP today   Tingling    Left Hand    HPI Laurie Monroe is a 43 y.o. female who presents today complaining of CP onset last night. This is the second time that this has happened to her and no one has been able to give her an answer. Her CP lasted a few seconds, then the tingling of the fingers began. 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 The steri-strips are still present on her leg and it is sore. She denies any color change. She states that she is having associated symptoms of tingling of left hand and HA. She denies SOB and any other associated symptoms.     Patient Active Problem List   Diagnosis Date Noted   Onychomycosis 09/01/2013   Pain in toe 09/01/2013   Seizures 07/08/2012   Past Medical History  Diagnosis Date   Anemia    Seizures    Allergy    History reviewed. No pertinent past surgical history. No Known Allergies Prior to Admission medications   Medication Sig Start Date End Date Taking? Authorizing Provider  albuterol (PROVENTIL HFA;VENTOLIN HFA) 108 (90 BASE) MCG/ACT inhaler Inhale 2 puffs into the lungs every 6 (six) hours as needed for wheezing or shortness of breath. 10/31/13  Yes Mancel Bale, PA-C  ibuprofen (ADVIL,MOTRIN) 200 MG tablet Take 600 mg by mouth every 6 (six) hours as needed.   Yes Historical Provider, MD  naproxen sodium (ALEVE) 220 MG tablet Take 440 mg by mouth as needed.   Yes Historical Provider, MD      Review of Systems  Respiratory: Negative for shortness of breath.   Cardiovascular: Positive for chest pain.  Musculoskeletal: Positive for arthralgias.  Skin: Negative for color change.    Neurological:       Tingling in left hand  All other systems reviewed and are negative.      Objective:   Physical Exam  Constitutional: She is oriented to person, place, and time. She appears well-developed and well-nourished. No distress.  HENT:  Head: Normocephalic and atraumatic.  Eyes: EOM are normal.  Neck: Neck supple. No tracheal deviation present.  Cardiovascular: Regular rhythm and normal heart sounds.  Tachycardia present.  Exam reveals no gallop.   No murmur heard. borderline tachycardia  Pulmonary/Chest: Effort normal and breath sounds normal. No respiratory distress.  Musculoskeletal: Normal range of motion.  Tenderness in the right thigh corresponding to the greater saphenous vein associated with induration  in the proximal aspect of the veins location.   Neurological: She is alert and oriented to person, place, and time.  Skin: Skin is warm and dry.  Psychiatric: She has a normal mood and affect. Her behavior is normal.  Nursing note and vitals reviewed.     BP 108/74 mmHg   Pulse 93   Temp(Src) 99.1 F (37.3 C) (Oral)   Resp 24   Ht 5' 5.5" (1.664 m)   Wt 239 lb 8 oz (108.636 kg)   BMI 39.23 kg/m2   SpO2 99%   LMP 06/27/2014   Assessment & Plan:  DIAGNOSTIC STUDIES: Oxygen Saturation is 99% on room air, normal by my interpretation.    COORDINATION OF CARE: 2:54 PM-Discussed treatment plan which includes US of the right upper thigh with pt at bedside and pt agreed to plan.    I personally performed the services described in this documentation, which was scribed in my presence. The recorded information has been reviewed and is accurate.  Pain in joint, lower leg, right - Plan: US Venous Img Lower Unilateral Right  Signed, Robyn Haber, MD  CLINICAL DATA: Post greater saphenous vein ablation 07/16/2014, pain and lump now at medial RIGHT upper thigh since surgery, chest pain and LEFT hand tingling  EXAM: RIGHT LOWER EXTREMITY VENOUS DOPPLER  ULTRASOUND  TECHNIQUE: Gray-scale sonography with graded compression, as well as color Doppler and duplex ultrasound were performed to evaluate the lower extremity deep venous systems from the level of the common femoral vein and including the common femoral, femoral, profunda femoral, popliteal and calf veins including the posterior tibial, peroneal and gastrocnemius veins when visible. The superficial great saphenous vein was also interrogated. Spectral Doppler was utilized to evaluate flow at rest and with distal augmentation maneuvers in the common femoral, femoral and popliteal veins.  COMPARISON: None  FINDINGS: Contralateral Common Femoral Vein: Respiratory phasicity is normal and symmetric with the symptomatic side. No evidence of thrombus. Normal compressibility.  Common Femoral Vein: No evidence of thrombus. Normal compressibility, respiratory phasicity and response to augmentation.  Saphenofemoral Junction: No evidence of thrombus. Normal compressibility and flow on color Doppler imaging.  Profunda Femoral Vein: No evidence of thrombus. Normal compressibility and flow on color Doppler imaging.  Femoral Vein: No evidence of thrombus. Normal compressibility, respiratory phasicity and response to augmentation.  Popliteal Vein: No evidence of thrombus. Normal compressibility, respiratory phasicity and response to augmentation.  Calf Veins: No evidence of thrombus. Normal compressibility and flow on color Doppler imaging.  Superficial Great Saphenous Vein: Thrombosed, containing scattered hypoechoic material throughout its length associated with tortuosity and thickened walls, consistent with history of ablation.  Venous Reflux: None.  Other Findings: No soft tissue mass or abnormal fluid collection identified.  IMPRESSION: No evidence of deep venous thrombosis in the RIGHT lower extremity.  Post ablation and greater saphenous vein, with  tortuous thrombosis segment of greater saphenous vein corresponding to the area of palpable concern.   Electronically Signed  By: Lavonia Dana M.D.  On: 07/24/2014 16:29  Addendum: Patient given results and told start aspirin 81 mg daily as well as altering 75 mg enteric-coated twice a day. Me know if symptoms persist from than 48 hours

## 2014-07-24 NOTE — Addendum Note (Signed)
Addended by: Robyn Haber on: 07/24/2014 05:30 PM   Modules accepted: Level of Service

## 2014-07-25 ENCOUNTER — Telehealth: Payer: Self-pay

## 2014-07-25 NOTE — Telephone Encounter (Signed)
Patient called requesting a work note on 07/24/14. Patient seen Dr. Joseph Art. Please call patient when work note is available. [phone]

## 2014-07-25 NOTE — Telephone Encounter (Signed)
Done

## 2014-07-25 NOTE — Telephone Encounter (Signed)
Ok to give

## 2014-07-25 NOTE — Telephone Encounter (Signed)
-----   Message from Robyn Haber, MD sent at 07/24/2014  5:30 PM EST ----- Please send copy of note to Dr. Renaldo Reel

## 2014-07-25 NOTE — Telephone Encounter (Signed)
[place] work note

## 2014-07-25 NOTE — Telephone Encounter (Signed)
LMOM that note is ready for p/u

## 2014-10-07 ENCOUNTER — Ambulatory Visit (INDEPENDENT_AMBULATORY_CARE_PROVIDER_SITE_OTHER): Payer: PRIVATE HEALTH INSURANCE

## 2014-10-07 ENCOUNTER — Ambulatory Visit (INDEPENDENT_AMBULATORY_CARE_PROVIDER_SITE_OTHER): Payer: PRIVATE HEALTH INSURANCE | Admitting: Family Medicine

## 2014-10-07 VITALS — BP 112/72 | HR 88 | Temp 98.9°F | Resp 16 | Ht 65.5 in | Wt 243.2 lb

## 2014-10-07 DIAGNOSIS — R059 Cough, unspecified: Secondary | ICD-10-CM

## 2014-10-07 DIAGNOSIS — R05 Cough: Secondary | ICD-10-CM

## 2014-10-07 DIAGNOSIS — R631 Polydipsia: Secondary | ICD-10-CM

## 2014-10-07 DIAGNOSIS — D72829 Elevated white blood cell count, unspecified: Secondary | ICD-10-CM

## 2014-10-07 LAB — POCT CBC
Granulocyte percent: 79.1 %G (ref 37–80)
HEMATOCRIT: 38.9 % (ref 37.7–47.9)
Hemoglobin: 11.8 g/dL — AB (ref 12.2–16.2)
Lymph, poc: 2.4 (ref 0.6–3.4)
MCH, POC: 20.1 pg — AB (ref 27–31.2)
MCHC: 30.3 g/dL — AB (ref 31.8–35.4)
MCV: 66.2 fL — AB (ref 80–97)
MID (cbc): 0.4 (ref 0–0.9)
MPV: 8.5 fL (ref 0–99.8)
PLATELET COUNT, POC: 370 10*3/uL (ref 142–424)
POC Granulocyte: 10.8 — AB (ref 2–6.9)
POC LYMPH PERCENT: 17.8 %L (ref 10–50)
POC MID %: 3.1 % (ref 0–12)
RBC: 5.87 M/uL — AB (ref 4.04–5.48)
RDW, POC: 15.6 %
WBC: 13.7 10*3/uL — AB (ref 4.6–10.2)

## 2014-10-07 LAB — POCT GLYCOSYLATED HEMOGLOBIN (HGB A1C): HEMOGLOBIN A1C: 5.2

## 2014-10-07 MED ORDER — AZITHROMYCIN 250 MG PO TABS: ORAL_TABLET | ORAL | Status: DC

## 2014-10-07 MED ORDER — IBUPROFEN 600 MG PO TABS: 600.0000 mg | ORAL_TABLET | Freq: Three times a day (TID) | ORAL | Status: DC | PRN

## 2014-10-07 MED ORDER — BENZONATATE 100 MG PO CAPS: 100.0000 mg | ORAL_CAPSULE | Freq: Three times a day (TID) | ORAL | Status: DC | PRN

## 2014-10-07 MED ORDER — GUAIFENESIN-CODEINE 100-10 MG/5ML PO SOLN: 5.0000 mL | ORAL | Status: DC | PRN

## 2014-10-07 NOTE — Progress Notes (Signed)
10/07/2014 at 8:50 PM  Laurie Monroe / DOB: 1970-09-22 / MRN: [id number]  The patient has Seizures; Onychomycosis; and Pain in toe on her problem list.  SUBJECTIVE  Chief compalaint: Cough; Emesis; and Headache   History of present illness: Laurie Monroe is 44 y.o. ill appearing female presenting for cough that started on 2 days ago and is getting worse. Associated symptoms include HA with cough only and she denies fever, syncope, DOE. Her cough is worse with lying down.  She has tried Mucinex with poor relief. 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  She has received the annual flu vaccine.   She complains of excessive thirst at work and has gained a significant amount of weight over the last year.    She  has a past medical history of Anemia; Seizures; and Allergy.    She  has a current medication list which includes the following prescription(s): albuterol, aspirin ec, ibuprofen, diclofenac, and naproxen sodium.  [name] has No Known Allergies. She  reports that she has never smoked. She has never used smokeless tobacco. She reports that she does not drink alcohol or use illicit drugs. She  has no sexual activity history on file.  The patient  has no past surgical history on file.  Her family history includes Cancer in her father; Hypertension in her brother, father, and mother.  Review of Systems  Constitutional: Negative for fever, chills, weight loss and malaise/fatigue.  HENT: Negative for sore throat.   Respiratory: Positive for cough and sputum production. Negative for shortness of breath and wheezing.   Cardiovascular: Negative for chest pain.  Gastrointestinal: Negative for heartburn.  Genitourinary: Negative.   Skin: Negative.   Neurological: Negative for headaches.    OBJECTIVE  Her  height is 5' 5.5" (1.664 m) and weight is 243 lb 3.2 oz (110.315 kg). Her oral temperature is 98.9 F (37.2 C). Her blood pressure is 112/72 and  her pulse is 88. Her respiration is 16 and oxygen saturation is 95%.  The patient's body mass index is 39.84 kg/(m^2).  Physical Exam  Constitutional: She is oriented to person, place, and time. She appears well-developed and well-nourished. No distress.  HENT:  Head: Normocephalic.  Neck: Normal range of motion.  Cardiovascular: Normal rate, regular rhythm and normal heart sounds.   Respiratory: Effort normal and breath sounds normal. No respiratory distress. She has no wheezes. She has no rales. She exhibits no tenderness.  GI: Soft. Bowel sounds are normal.  Neurological: She is alert and oriented to person, place, and time.  Skin: Skin is warm and dry. She is not diaphoretic.  Psychiatric: She has a normal mood and affect. Her behavior is normal. Judgment and thought content normal.    Results for orders placed or performed in visit on 10/07/14 (from the past 24 hour(s))  POCT CBC     Status: Abnormal   Collection Time: 10/07/14  8:30 PM  Result Value Ref Range   WBC 13.7 (A) 4.6 - 10.2 K/uL   Lymph, poc 2.4 0.6 - 3.4   POC LYMPH PERCENT 17.8 10 - 50 %L   MID (cbc) 0.4 0 - 0.9   POC MID % 3.1 0 - 12 %M   POC Granulocyte 10.8 (A) 2 - 6.9   Granulocyte percent 79.1 37 - 80 %G   RBC 5.87 (A) 4.04 - 5.48 M/uL   Hemoglobin 11.8 (A) 12.2 - 16.2 g/dL   HCT,  POC 38.9 37.7 - 47.9 %   MCV 66.2 (A) 80 - 97 fL   MCH, POC 20.1 (A) 27 - 31.2 pg   MCHC 30.3 (A) 31.8 - 35.4 g/dL   RDW, POC 15.6 %   Platelet Count, POC 370 142 - 424 K/uL   MPV 8.5 0 - 99.8 fL  POCT glycosylated hemoglobin (Hb A1C)     Status: None   Collection Time: 10/07/14  8:33 PM  Result Value Ref Range   Hemoglobin A1C 5.2    [facility] reading (PRIMARY) by  Dr. Tamala Julian: Negative for acute cardiopulmonary disease.   ASSESSMENT & PLAN  Shaana was seen today for cough, emesis and headache.  Diagnoses and associated orders for this visit:  Cough: Patient has a leukocytosis and negative chest xray.  Can not rule out  pneumonia from HPI, exam, and lab work.  Will cover for atypicals.  Patient advised to call or come back to clinic should she not improve or get worse.   - POCT CBC - DG Chest 2 View; Future - ibuprofen (ADVIL,MOTRIN) 600 MG tablet; Take 1 tablet (600 mg total) by mouth every 8 (eight) hours as needed. - guaiFENesin-codeine 100-10 MG/5ML syrup; Take 5 mLs by mouth every 4 (four) hours as needed for cough. - benzonatate (TESSALON) 100 MG capsule; Take 1-2 capsules (100-200 mg total) by mouth 3 (three) times daily as needed for cough.  Excessive thirst - POCT glycosylated hemoglobin (Hb A1C)  Leukocytosis - azithromycin (ZITHROMAX) 250 MG tablet; Take 2 tabs PO x 1 dose, then 1 tab PO QD x 4 days     The patient was instructed to to call or comeback to clinic as needed, or should symptoms warrant.  Philis Fendt, MHS, PA-C [facility] 10/07/2014 8:50 PM

## 2014-10-10 ENCOUNTER — Ambulatory Visit (INDEPENDENT_AMBULATORY_CARE_PROVIDER_SITE_OTHER): Payer: PRIVATE HEALTH INSURANCE | Admitting: Family Medicine

## 2014-10-10 VITALS — BP 116/68 | HR 73 | Temp 98.3°F | Resp 16 | Ht 65.5 in | Wt 244.6 lb

## 2014-10-10 DIAGNOSIS — R05 Cough: Secondary | ICD-10-CM

## 2014-10-10 DIAGNOSIS — D72829 Elevated white blood cell count, unspecified: Secondary | ICD-10-CM

## 2014-10-10 DIAGNOSIS — J069 Acute upper respiratory infection, unspecified: Secondary | ICD-10-CM

## 2014-10-10 DIAGNOSIS — R059 Cough, unspecified: Secondary | ICD-10-CM

## 2014-10-10 LAB — POCT CBC
Granulocyte percent: 74.5 %G (ref 37–80)
HCT, POC: 39.6 % (ref 37.7–47.9)
Hemoglobin: 12.5 g/dL (ref 12.2–16.2)
Lymph, poc: 2.7 (ref 0.6–3.4)
MCH, POC: 20.7 pg — AB (ref 27–31.2)
MCHC: 31.5 g/dL — AB (ref 31.8–35.4)
MCV: 65.6 fL — AB (ref 80–97)
MID (cbc): 0.6 (ref 0–0.9)
MPV: 8.5 fL (ref 0–99.8)
POC Granulocyte: 9.8 — AB (ref 2–6.9)
POC LYMPH PERCENT: 20.8 %L (ref 10–50)
POC MID %: 4.7 %M (ref 0–12)
Platelet Count, POC: 375 10*3/uL (ref 142–424)
RBC: 6.03 M/uL — AB (ref 4.04–5.48)
RDW, POC: 15.2 %
WBC: 13.1 10*3/uL — AB (ref 4.6–10.2)

## 2014-10-10 MED ORDER — CULTURELLE PO CAPS: 1.0000 | ORAL_CAPSULE | Freq: Every day | ORAL | Status: DC

## 2014-10-10 MED ORDER — HYDROCODONE-HOMATROPINE 5-1.5 MG/5ML PO SYRP: 5.0000 mL | ORAL_SOLUTION | Freq: Three times a day (TID) | ORAL | Status: DC | PRN

## 2014-10-10 MED ORDER — AMOXICILLIN-POT CLAVULANATE 875-125 MG PO TABS: 1.0000 | ORAL_TABLET | Freq: Two times a day (BID) | ORAL | Status: DC

## 2014-10-10 NOTE — Progress Notes (Signed)
Chief Complaint:  Chief Complaint  Patient presents with  . Follow-up    cough  . Laryngitis    HPI: Laurie Monroe is a 44 y.o. female who is here for  Cough and URI f.u  No fever, + chills. Continue to cough but not as bad, but it is makin gher throat hurt and losing her voice. She was put on z pack  And cough med with codiene.  Laryngitis now. Has a hx of allergies, NO SOB, CP, wheezing.   Past Medical History  Diagnosis Date  . Anemia   . Seizures   . Allergy    History reviewed. No pertinent past surgical history. History   Social History  . Marital Status: Single    Spouse Name: N/A    Number of Children: N/A  . Years of Education: N/A   Occupational History  . CNA    Social History Main Topics  . Smoking status: Never Smoker   . Smokeless tobacco: Never Used  . Alcohol Use: No  . Drug Use: No  . Sexual Activity: None   Other Topics Concern  . None   Social History Narrative   Family History  Problem Relation Age of Onset  . Hypertension Mother   . Cancer Father   . Hypertension Father   . Hypertension Brother    No Known Allergies Prior to Admission medications   Medication Sig Start Date End Date Taking? Authorizing Provider  aspirin EC 81 MG tablet Take 1 tablet (81 mg total) by mouth daily. 07/24/14  Yes Robyn Haber, MD  azithromycin (ZITHROMAX) 250 MG tablet Take 2 tabs PO x 1 dose, then 1 tab PO QD x 4 days 10/07/14  Yes Kathlen Brunswick, PA-C  ibuprofen (ADVIL,MOTRIN) 600 MG tablet Take 1 tablet (600 mg total) by mouth every 8 (eight) hours as needed. 10/07/14  Yes Kathlen Brunswick, PA-C  naproxen sodium (ALEVE) 220 MG tablet Take 440 mg by mouth as needed.   Yes Historical Provider, MD  albuterol (PROVENTIL HFA;VENTOLIN HFA) 108 (90 BASE) MCG/ACT inhaler Inhale 2 puffs into the lungs every 6 (six) hours as needed for wheezing or shortness of breath. Patient not taking: Reported on 10/10/2014 10/31/13   Mancel Bale, PA-C  benzonatate  (TESSALON) 100 MG capsule Take 1-2 capsules (100-200 mg total) by mouth 3 (three) times daily as needed for cough. Patient not taking: Reported on 10/10/2014 10/07/14   Kathlen Brunswick, PA-C  HYDROcodone-homatropine [facility]) 5-1.5 MG/5ML syrup Take 5 mLs by mouth every 8 (eight) hours as needed. 10/10/14   Patrice Moates P Gurtaj Ruz, DO     ROS: The patient denies fevers, night sweats, unintentional weight loss, chest pain, palpitations, wheezing, dyspnea on exertion, nausea, vomiting, abdominal pain, dysuria, hematuria, melena, numbness, weakness, or tingling.  All other systems have been reviewed and were otherwise negative with the exception of those mentioned in the HPI and as above.    PHYSICAL EXAM: Filed Vitals:   10/10/14 1012  BP: 116/68  Pulse: 73  Temp: 98.3 F (36.8 C)  Resp: 16   Filed Vitals:   10/10/14 1012  Height: 5' 5.5" (1.664 m)  Weight: 244 lb 9.6 oz (110.95 kg)   Body mass index is 40.07 kg/(m^2).  General: Alert, no acute distress HEENT:  Normocephalic, atraumatic, oropharynx patent. EOMI, PERRLA. Erythematous throat, no exudates, TM normal, +/- sinus tenderness, + erythematous/boggy nasal mucosa Cardiovascular:  Regular rate and rhythm, no rubs murmurs or gallops.  No Carotid bruits, radial pulse intact. No pedal edema.  Respiratory: Clear to auscultation bilaterally.  No wheezes, rales, or rhonchi.  No cyanosis, no use of accessory musculature GI: No organomegaly, abdomen is soft and non-tender, positive bowel sounds.  No masses. Skin: No rashes. Neurologic: Facial musculature symmetric. Psychiatric: Patient is appropriate throughout our interaction. Lymphatic: No cervical lymphadenopathy Musculoskeletal: Gait intact.   LABS: Results for orders placed or performed in visit on 10/10/14  POCT CBC  Result Value Ref Range   WBC 13.1 (A) 4.6 - 10.2 K/uL   Lymph, poc 2.7 0.6 - 3.4   POC LYMPH PERCENT 20.8 10 - 50 %L   MID (cbc) 0.6 0 - 0.9   POC MID % 4.7 0 - 12 %M    POC Granulocyte 9.8 (A) 2 - 6.9   Granulocyte percent 74.5 37 - 80 %G   RBC 6.03 (A) 4.04 - 5.48 M/uL   Hemoglobin 12.5 12.2 - 16.2 g/dL   HCT, POC 39.6 37.7 - 47.9 %   MCV 65.6 (A) 80 - 97 fL   MCH, POC 20.7 (A) 27 - 31.2 pg   MCHC 31.5 (A) 31.8 - 35.4 g/dL   RDW, POC 15.2 %   Platelet Count, POC 375 142 - 424 K/uL   MPV 8.5 0 - 99.8 fL     EKG/XRAY:   Primary read interpreted by Dr. Marin Comment at [facility].   ASSESSMENT/PLAN: Encounter Diagnoses  Name Primary?  . Acute upper respiratory infection Yes  . Cough   . Leukocytosis    44 y.o female with PMH of seizures  here for recheck of upper respiratory infection , no improvement with z pack and cough meds last 3 days. Her white count has barely changed. Shewould like to try soemthing different. DC z pack and current cough syrup Rx Augmentin, Hycodan Work note given, f/u prn , vocal cord rest, push fluids  Gross sideeffects, risk and benefits, and alternatives of medications d/w patient. Patient is aware that all medications have potential sideeffects and we are unable to predict every sideeffect or drug-drug interaction that may occur.  Nicklos Gaxiola, [place], DO 10/10/2014 11:41 AM

## 2014-10-24 ENCOUNTER — Ambulatory Visit (INDEPENDENT_AMBULATORY_CARE_PROVIDER_SITE_OTHER): Payer: PRIVATE HEALTH INSURANCE

## 2014-10-24 ENCOUNTER — Ambulatory Visit (INDEPENDENT_AMBULATORY_CARE_PROVIDER_SITE_OTHER): Payer: PRIVATE HEALTH INSURANCE | Admitting: Physician Assistant

## 2014-10-24 VITALS — BP 111/76 | HR 81 | Temp 99.5°F | Resp 18 | Wt 249.0 lb

## 2014-10-24 DIAGNOSIS — R059 Cough, unspecified: Secondary | ICD-10-CM

## 2014-10-24 DIAGNOSIS — R3589 Other polyuria: Secondary | ICD-10-CM

## 2014-10-24 DIAGNOSIS — R5383 Other fatigue: Secondary | ICD-10-CM

## 2014-10-24 DIAGNOSIS — R358 Other polyuria: Secondary | ICD-10-CM

## 2014-10-24 DIAGNOSIS — J988 Other specified respiratory disorders: Secondary | ICD-10-CM

## 2014-10-24 DIAGNOSIS — R05 Cough: Secondary | ICD-10-CM

## 2014-10-24 LAB — POCT CBC
GRANULOCYTE PERCENT: 72.4 % (ref 37–80)
HCT, POC: 37.9 % (ref 37.7–47.9)
Hemoglobin: 11.8 g/dL — AB (ref 12.2–16.2)
Lymph, poc: 2.9 (ref 0.6–3.4)
MCH: 20.3 pg — AB (ref 27–31.2)
MCHC: 31 g/dL — AB (ref 31.8–35.4)
MCV: 65.5 fL — AB (ref 80–97)
MID (cbc): 0.4 (ref 0–0.9)
MPV: 8.7 fL (ref 0–99.8)
PLATELET COUNT, POC: 326 10*3/uL (ref 142–424)
POC GRANULOCYTE: 8.8 — AB (ref 2–6.9)
POC LYMPH PERCENT: 23.9 %L (ref 10–50)
POC MID %: 3.7 %M (ref 0–12)
RBC: 5.79 M/uL — AB (ref 4.04–5.48)
RDW, POC: 15.2 %
WBC: 12.1 10*3/uL — AB (ref 4.6–10.2)

## 2014-10-24 LAB — POCT UA - MICROSCOPIC ONLY
Casts, Ur, LPF, POC: NEGATIVE
Crystals, Ur, HPF, POC: NEGATIVE
Mucus, UA: NEGATIVE
RBC, URINE, MICROSCOPIC: NEGATIVE
YEAST UA: NEGATIVE

## 2014-10-24 LAB — POCT URINALYSIS DIPSTICK
Bilirubin, UA: NEGATIVE
Glucose, UA: NEGATIVE
Ketones, UA: NEGATIVE
LEUKOCYTES UA: NEGATIVE
NITRITE UA: NEGATIVE
PROTEIN UA: NEGATIVE
RBC UA: NEGATIVE
Spec Grav, UA: 1.02
Urobilinogen, UA: 0.2
pH, UA: 6.5

## 2014-10-24 MED ORDER — DOXYCYCLINE HYCLATE 100 MG PO CAPS: 100.0000 mg | ORAL_CAPSULE | Freq: Two times a day (BID) | ORAL | Status: AC

## 2014-10-24 NOTE — Patient Instructions (Signed)
We will treat you for a atypical bacteria.  If you continue to feel malaise and fatigue, you can return to the office or contact me.    Doxycycline tablets or capsules What is this medicine? DOXYCYCLINE (dox i SYE kleen) is a tetracycline antibiotic. It kills certain bacteria or stops their growth. It is used to treat many kinds of infections, like dental, skin, respiratory, and urinary tract infections. It also treats acne, Lyme disease, malaria, and certain sexually transmitted infections. This medicine may be used for other purposes; ask your health care provider or pharmacist if you have questions. COMMON BRAND NAME(S): Acticlate, Adoxa, Adoxa CK, Adoxa Pak, Adoxa TT, Alodox, Avidoxy, Doxal, Monodox, Morgidox 1x, Morgidox 1x Kit, Morgidox 2x, Morgidox 2x Kit, Ocudox, Vibra-Tabs, Vibramycin What should I tell my health care provider before I take this medicine? They need to know if you have any of these conditions: -liver disease -long exposure to sunlight like working outdoors -stomach problems like colitis -an unusual or allergic reaction to doxycycline, tetracycline antibiotics, other medicines, foods, dyes, or preservatives -pregnant or trying to get pregnant -breast-feeding How should I use this medicine? Take this medicine by mouth with a full glass of water. Follow the directions on the prescription label. It is best to take this medicine without food, but if it upsets your stomach take it with food. Take your medicine at regular intervals. Do not take your medicine more often than directed. Take all of your medicine as directed even if you think you are better. Do not skip doses or stop your medicine early. Talk to your pediatrician regarding the use of this medicine in children. Special care may be needed. While this drug may be prescribed for children as young as 67 years old for selected conditions, precautions do apply. Overdosage: If you think you have taken too much of this medicine  contact a poison control center or emergency room at once. NOTE: This medicine is only for you. Do not share this medicine with others. What if I miss a dose? If you miss a dose, take it as soon as you can. If it is almost time for your next dose, take only that dose. Do not take double or extra doses. What may interact with this medicine? -antacids -barbiturates -birth control pills -bismuth subsalicylate -carbamazepine -methoxyflurane -other antibiotics -phenytoin -vitamins that contain iron -warfarin This list may not describe all possible interactions. Give your health care provider a list of all the medicines, herbs, non-prescription drugs, or dietary supplements you use. Also tell them if you smoke, drink alcohol, or use illegal drugs. Some items may interact with your medicine. What should I watch for while using this medicine? Tell your doctor or health care professional if your symptoms do not improve. Do not treat diarrhea with over the counter products. Contact your doctor if you have diarrhea that lasts more than 2 days or if it is severe and watery. Do not take this medicine just before going to bed. It may not dissolve properly when you lay down and can cause pain in your throat. Drink plenty of fluids while taking this medicine to also help reduce irritation in your throat. This medicine can make you more sensitive to the sun. Keep out of the sun. If you cannot avoid being in the sun, wear protective clothing and use sunscreen. Do not use sun lamps or tanning beds/booths. Birth control pills may not work properly while you are taking this medicine. Talk to your doctor about using  an extra method of birth control. If you are being treated for a sexually transmitted infection, avoid sexual contact until you have finished your treatment. Your sexual partner may also need treatment. Avoid antacids, aluminum, calcium, magnesium, and iron products for 4 hours before and 2 hours after  taking a dose of this medicine. If you are using this medicine to prevent malaria, you should still protect yourself from contact with mosquitos. Stay in screened-in areas, use mosquito nets, keep your body covered, and use an insect repellent. What side effects may I notice from receiving this medicine? Side effects that you should report to your doctor or health care professional as soon as possible: -allergic reactions like skin rash, itching or hives, swelling of the face, lips, or tongue -difficulty breathing -fever -itching in the rectal or genital area -pain on swallowing -redness, blistering, peeling or loosening of the skin, including inside the mouth -severe stomach pain or cramps -unusual bleeding or bruising -unusually weak or tired -yellowing of the eyes or skin Side effects that usually do not require medical attention (report to your doctor or health care professional if they continue or are bothersome): -diarrhea -loss of appetite -nausea, vomiting This list may not describe all possible side effects. Call your doctor for medical advice about side effects. You may report side effects to FDA at 1-800-FDA-1088. Where should I keep my medicine? Keep out of the reach of children. Store at room temperature, below 30 degrees C (86 degrees F). Protect from light. Keep container tightly closed. Throw away any unused medicine after the expiration date. Taking this medicine after the expiration date can make you seriously ill. NOTE: This sheet is a summary. It may not cover all possible information. If you have questions about this medicine, talk to your doctor, pharmacist, or health care provider.  2015, [employer] Standard. (2013-06-26 13:58:06)

## 2014-10-24 NOTE — Progress Notes (Signed)
[facility] [address], [place] [address] [phone]  Date:  10/24/2014   Name:  Laurie Monroe   DOB:  [birth year]   MRN:  [id number]  PCP:  No PCP Per Patient    Chief Complaint: Follow-up and URI   History of Present Illness:  Laurie Monroe is a 44 y.o. very pleasant female patient who presents with the following: Patient returns to [facility] for continued malaise, weakness, and fatigue.  She was seen about 2.5 weeks ago for respiratory infection with elevated WBC 13.7 and treated with azithromycin.  Patient returned 3 days later with continued fever and no improvement.  Patient cbc 13.1.  Patient was then placed on augmentin.  She is now 5 days from the end of abx use.  She reports that her upper respiratory illness has resolved with just a lingering non-productive cough, but she continues to feel a general malaise, weakness and fatigue.  Her appetite has returned.  Last diarrhea episode was 3 days ago same day as end abx use.  She has no dyspnea or sob.  She has had the flu vaccine.  She has no abnormal rash, beside some dryess on the right wrist.  Last STD screening was over 5 years ago.    She has a hx of anemia.     Patient Active Problem List   Diagnosis Date Noted  . Onychomycosis 09/01/2013  . Pain in toe 09/01/2013  . Seizures 07/08/2012    Past Medical History  Diagnosis Date  . Anemia   . Seizures   . Allergy     History reviewed. No pertinent past surgical history.  History  Substance Use Topics  . Smoking status: Never Smoker   . Smokeless tobacco: Never Used  . Alcohol Use: No    Family History  Problem Relation Age of Onset  . Hypertension Mother   . Cancer Father   . Hypertension Father   . Hypertension Brother     No Known Allergies  Medication list has been reviewed and updated.  Current Outpatient Prescriptions on File Prior to Visit  Medication Sig Dispense Refill  . albuterol (PROVENTIL HFA;VENTOLIN HFA) 108 (90  BASE) MCG/ACT inhaler Inhale 2 puffs into the lungs every 6 (six) hours as needed for wheezing or shortness of breath. 1 Inhaler 0  . aspirin EC 81 MG tablet Take 1 tablet (81 mg total) by mouth daily. 100 tablet 3  . benzonatate (TESSALON) 100 MG capsule Take 1-2 capsules (100-200 mg total) by mouth 3 (three) times daily as needed for cough. 40 capsule 0  . HYDROcodone-homatropine (HYCODAN) 5-1.5 MG/5ML syrup Take 5 mLs by mouth every 8 (eight) hours as needed. 180 mL 0  . Lactobacillus Rhamnosus, GG, (CULTURELLE) CAPS Take 1 tablet by mouth daily. 30 capsule 0  . naproxen sodium (ALEVE) 220 MG tablet Take 440 mg by mouth as needed.     No current facility-administered medications on file prior to visit.    Review of Systems  Constitutional: Positive for fever and malaise/fatigue. Negative for chills and weight loss.  HENT: Negative for congestion, ear pain and sore throat.   Eyes: Negative for blurred vision, photophobia and pain.  Respiratory: Positive for cough. Negative for sputum production, shortness of breath and wheezing.   Cardiovascular: Negative for chest pain, palpitations and leg swelling.  Gastrointestinal: Positive for nausea (2ndary to the abx.  Has resolved.). Negative for diarrhea (2ndary to the abx.  this has resolved), constipation  and blood in stool.  Genitourinary: Positive for dysuria and frequency. Negative for hematuria.  Musculoskeletal: Negative for myalgias and back pain.  Skin: Negative for rash.  Neurological: Positive for weakness. Negative for dizziness, tremors and headaches.  Endo/Heme/Allergies: Positive for polydipsia.     Physical Examination: Filed Vitals:   10/24/14 0848  BP: 111/76  Pulse: 81  Temp: 99.5 F (37.5 C)  Resp: 18   Filed Vitals:   10/24/14 0848  Weight: 249 lb (112.946 kg)   Body mass index is 40.79 kg/(m^2). Ideal Body Weight:    Physical Exam  Constitutional: She is oriented to person, place, and time and  well-developed, well-nourished, and in no distress. No distress.  Neck: No thyromegaly present.  Cardiovascular: Normal rate, regular rhythm and normal heart sounds.  Exam reveals no gallop and no friction rub.   No murmur heard. Pulmonary/Chest: Effort normal and breath sounds normal. No respiratory distress. She has no wheezes.  Abdominal: Soft. Bowel sounds are normal. She exhibits no distension and no mass. There is no tenderness.  Lymphadenopathy:       Head (right side): No submandibular, no tonsillar, no preauricular and no posterior auricular adenopathy present.       Head (left side): No submandibular, no tonsillar, no preauricular and no posterior auricular adenopathy present.    She has no cervical adenopathy.       Right: No supraclavicular adenopathy present.       Left: No supraclavicular adenopathy present.  Neurological: She is alert and oriented to person, place, and time.  Skin: Skin is warm and dry.  Psychiatric: Affect and judgment normal.    Results for orders placed or performed in visit on 10/24/14  POCT CBC  Result Value Ref Range   WBC 12.1 (A) 4.6 - 10.2 K/uL   Lymph, poc 2.9 0.6 - 3.4   POC LYMPH PERCENT 23.9 10 - 50 %L   MID (cbc) 0.4 0 - 0.9   POC MID % 3.7 0 - 12 %M   POC Granulocyte 8.8 (A) 2 - 6.9   Granulocyte percent 72.4 37 - 80 %G   RBC 5.79 (A) 4.04 - 5.48 M/uL   Hemoglobin 11.8 (A) 12.2 - 16.2 g/dL   HCT, POC 37.9 37.7 - 47.9 %   MCV 65.5 (A) 80 - 97 fL   MCH, POC 20.3 (A) 27 - 31.2 pg   MCHC 31.0 (A) 31.8 - 35.4 g/dL   RDW, POC 15.2 %   Platelet Count, POC 326 142 - 424 K/uL   MPV 8.7 0 - 99.8 fL  POCT UA - Microscopic Only  Result Value Ref Range   WBC, Ur, HPF, POC 0-2    RBC, urine, microscopic neg    Bacteria, U Microscopic trace    Mucus, UA neg    Epithelial cells, urine per micros 1-3    Crystals, Ur, HPF, POC neg    Casts, Ur, LPF, POC neg    Yeast, UA neg   POCT urinalysis dipstick  Result Value Ref Range   Color, UA  yellow    Clarity, UA clear    Glucose, UA neg    Bilirubin, UA neg    Ketones, UA neg    Spec Grav, UA 1.020    Blood, UA neg    pH, UA 6.5    Protein, UA neg    Urobilinogen, UA 0.2    Nitrite, UA neg    Leukocytes, UA Negative    [facility] reading (  PRIMARY) by  Dr. Tamala Julian: Small LLL infiltrate  Assessment and Plan: 44 year old female is here for follow up of URI and fatigue.  Treating for lower respiratory infection and cover for atypicals.  Patient will rtc if fatigue and sxs continue after 1 week.    Respiratory infection - Plan: doxycycline (VIBRAMYCIN) 100 MG capsule  Other fatigue - Plan: POCT CBC  Cough - Plan: DG Chest 2 View, doxycycline (VIBRAMYCIN) 100 MG capsule  Polyuria - Plan: POCT UA - Microscopic Only, POCT urinalysis dipstick  Ivar Drape, PA-C [facility] 2/22/20169:04 AM

## 2014-12-12 ENCOUNTER — Ambulatory Visit (INDEPENDENT_AMBULATORY_CARE_PROVIDER_SITE_OTHER): Payer: PRIVATE HEALTH INSURANCE | Admitting: Family Medicine

## 2014-12-12 VITALS — BP 132/92 | HR 92 | Temp 98.2°F | Resp 20 | Ht 66.5 in | Wt 245.2 lb

## 2014-12-12 DIAGNOSIS — J309 Allergic rhinitis, unspecified: Secondary | ICD-10-CM | POA: Diagnosis not present

## 2014-12-12 DIAGNOSIS — J301 Allergic rhinitis due to pollen: Secondary | ICD-10-CM | POA: Diagnosis not present

## 2014-12-12 DIAGNOSIS — R05 Cough: Secondary | ICD-10-CM | POA: Diagnosis not present

## 2014-12-12 DIAGNOSIS — D509 Iron deficiency anemia, unspecified: Secondary | ICD-10-CM | POA: Diagnosis not present

## 2014-12-12 DIAGNOSIS — R053 Chronic cough: Secondary | ICD-10-CM

## 2014-12-12 LAB — POCT CBC
GRANULOCYTE PERCENT: 66.3 % (ref 37–80)
HCT, POC: 38.7 % (ref 37.7–47.9)
Hemoglobin: 11.9 g/dL — AB (ref 12.2–16.2)
LYMPH, POC: 3.2 (ref 0.6–3.4)
MCH: 19.7 pg — AB (ref 27–31.2)
MCHC: 30.8 g/dL — AB (ref 31.8–35.4)
MCV: 63.8 fL — AB (ref 80–97)
MID (cbc): 0.7 (ref 0–0.9)
MPV: 8.6 fL (ref 0–99.8)
POC Granulocyte: 7.6 — AB (ref 2–6.9)
POC LYMPH PERCENT: 27.8 %L (ref 10–50)
POC MID %: 5.9 %M (ref 0–12)
Platelet Count, POC: 387 10*3/uL (ref 142–424)
RBC: 6.06 M/uL — AB (ref 4.04–5.48)
RDW, POC: 16.2 %
WBC: 11.5 10*3/uL — AB (ref 4.6–10.2)

## 2014-12-12 MED ORDER — PREDNISONE 20 MG PO TABS: ORAL_TABLET | ORAL | Status: DC

## 2014-12-12 MED ORDER — CETIRIZINE HCL 10 MG PO TABS: 10.0000 mg | ORAL_TABLET | Freq: Every day | ORAL | Status: DC

## 2014-12-12 MED ORDER — RANITIDINE HCL 150 MG PO TABS: 150.0000 mg | ORAL_TABLET | Freq: Two times a day (BID) | ORAL | Status: DC

## 2014-12-12 MED ORDER — PROMETHAZINE-CODEINE 6.25-10 MG/5ML PO SYRP: 5.0000 mL | ORAL_SOLUTION | ORAL | Status: DC | PRN

## 2014-12-12 MED ORDER — MONTELUKAST SODIUM 10 MG PO TABS: 10.0000 mg | ORAL_TABLET | Freq: Every day | ORAL | Status: DC

## 2014-12-12 NOTE — Progress Notes (Signed)
Subjective:  This chart was scribed for Dr. Delman Cheadle, MD by Erling Conte, ED Scribe. This patient was seen in Room 3 and the patient's care was started at 4:22 PM.   Patient ID: Laurie Monroe, female    DOB: [birth year], 44 y.o.   MRN: [id number]  Chief Complaint  Patient presents with  . Nasal Congestion  . Cough    x one month    HPI HPI Comments: Laurie Monroe is a 44 y.o. female who [facility] the [facility] complaining of intermittent, moderate, persistent, dry cough for 3 months. She is having associated nasal congestion, sneezing, sore throat, and rhinorrhea. Pt was seen 3 times in February 2016 for similar symptoms. In February she became ill with URI type symptoms. Her WBC was checked and it was 13.7 on z-pak. 3 days later she continued to have a fever and WBC was 13.1 and she was placed on Augmentin. 1 week after she was still feeling weak, fatigued, with persistent cough, her WBC was 12.1 and CXR was done and she was thought to have small LLL infiltrate per Luck, however, it was normal per radiology. She was put on doxycycline. Pt also notes she had been previously put on Tessalon pearls which did not resolve the cough. Pt denies any h/o asthma. She has been using Zyrtec and Mucinex for 4 days with no relief. She has also been using Ibuprofen which only provided relief for her sore throat. She uses an albuterol inhaler as needed. Pt reports she has only used it 2x in the past two week. She notes she seems to use it mostly when her cough gets worse. She does not believe it is providing relief for her symptoms. Pt has never followed up with a pulmonologist. Pt denies any h/o heartburn or indigestion. She states her symptoms have been keeping her up at night. She denies any fever, chills, generalized body aches, urinary symptoms, nausea, vomiting, abdominal pain, or diarrhea.  Past Medical History  Diagnosis Date  . Anemia   . Seizures   . Allergy     Current Outpatient Prescriptions on File Prior to Visit  Medication Sig Dispense Refill  . albuterol (PROVENTIL HFA;VENTOLIN HFA) 108 (90 BASE) MCG/ACT inhaler Inhale 2 puffs into the lungs every 6 (six) hours as needed for wheezing or shortness of breath. 1 Inhaler 0  . aspirin EC 81 MG tablet Take 1 tablet (81 mg total) by mouth daily. 100 tablet 3  . naproxen sodium (ALEVE) 220 MG tablet Take 440 mg by mouth as needed.     No current facility-administered medications on file prior to visit.   No Known Allergies  Review of Systems  Constitutional: Positive for activity change and fatigue. Negative for fever, chills, appetite change and unexpected weight change.  HENT: Positive for congestion, postnasal drip, rhinorrhea, sinus pressure, sneezing and sore throat.   Respiratory: Positive for cough (dry). Negative for shortness of breath and wheezing.   Cardiovascular: Negative for chest pain and palpitations.  Gastrointestinal: Negative for nausea, vomiting, abdominal pain, diarrhea and abdominal distention.  Genitourinary: Negative.  Negative for dysuria, urgency and decreased urine volume.  Musculoskeletal: Negative for myalgias and arthralgias.  Neurological: Positive for weakness.  Psychiatric/Behavioral: Positive for sleep disturbance.    Triage Vitals: BP 132/92 mmHg  Pulse 92  Temp(Src) 98.2 F (36.8 C) (Oral)  Resp 20  Ht 5' 6.5" (1.689 m)  Wt 245 lb 4 oz (111.245 kg)  BMI  39.00 kg/m2  SpO2 98%  LMP 11/27/2014  Objective:   Physical Exam  Constitutional: She is oriented to person, place, and time. She appears well-developed and well-nourished. No distress.  HENT:  Head: Normocephalic and atraumatic.  Nose: Rhinorrhea present.  Mouth/Throat: Posterior oropharyngeal edema present. No oropharyngeal exudate or posterior oropharyngeal erythema.  2+ tonsils. Nasal congestion and purulent rhinitis with nasal mucosa erythema  Eyes: Conjunctivae and EOM are normal.  Neck:  Neck supple. No tracheal deviation present. No thyroid mass and no thyromegaly present.  Cardiovascular: Normal rate, regular rhythm, S1 normal, S2 normal and normal heart sounds.   No murmur heard. Pulmonary/Chest: Effort normal and breath sounds normal. No respiratory distress.  CTA. Decreased air movement  Musculoskeletal: Normal range of motion.  Lymphadenopathy:  No lymphadenopathy  Neurological: She is alert and oriented to person, place, and time.  Skin: Skin is warm and dry.  Psychiatric: She has a normal mood and affect. Her behavior is normal.  Nursing note and vitals reviewed.  Results for orders placed or performed in visit on 12/12/14  POCT CBC  Result Value Ref Range   WBC 11.5 (A) 4.6 - 10.2 K/uL   Lymph, poc 3.2 0.6 - 3.4   POC LYMPH PERCENT 27.8 10 - 50 %L   MID (cbc) 0.7 0 - 0.9   POC MID % 5.9 0 - 12 %M   POC Granulocyte 7.6 (A) 2 - 6.9   Granulocyte percent 66.3 37 - 80 %G   RBC 6.06 (A) 4.04 - 5.48 M/uL   Hemoglobin 11.9 (A) 12.2 - 16.2 g/dL   HCT, POC 38.7 37.7 - 47.9 %   MCV 63.8 (A) 80 - 97 fL   MCH, POC 19.7 (A) 27 - 31.2 pg   MCHC 30.8 (A) 31.8 - 35.4 g/dL   RDW, POC 16.2 %   Platelet Count, POC 387 142 - 424 K/uL   MPV 8.6 0 - 99.8 fL       Assessment & Plan:   1. Allergic rhinitis, unspecified allergic rhinitis type   2. Hay fever   3. Chronic cough   4. Anemia, iron deficiency     Orders Placed This Encounter  Procedures  . Ferritin  . POCT CBC    Meds ordered this encounter  Medications  . promethazine-codeine (PHENERGAN WITH CODEINE) 6.25-10 MG/5ML syrup    Sig: Take 5 mLs by mouth every 4 (four) hours as needed for cough.    Dispense:  120 mL    Refill:  0  . predniSONE (DELTASONE) 20 MG tablet    Sig: Take 3 tabs po qd x 3d, then 2 tabs po qd x 3d, then 1 tab po qd x 3d, then stop.    Dispense:  18 tablet    Refill:  0  . cetirizine (ZYRTEC) 10 MG tablet    Sig: Take 1 tablet (10 mg total) by mouth at bedtime.    Dispense:   30 tablet    Refill:  11  . montelukast (SINGULAIR) 10 MG tablet    Sig: Take 1 tablet (10 mg total) by mouth at bedtime.    Dispense:  30 tablet    Refill:  3  . ranitidine (ZANTAC) 150 MG tablet    Sig: Take 1 tablet (150 mg total) by mouth 2 (two) times daily.    Dispense:  60 tablet    Refill:  0    I personally performed the services described in this documentation, which was scribed  in my presence. The recorded information has been reviewed and considered, and addended by me as needed.  Delman Cheadle, MD MPH

## 2014-12-12 NOTE — Patient Instructions (Addendum)
Start the ranitidine while you are on the prednisone.  Start the cetirizine and the montelukast in 1 week.  The most common causes of chronic cough in immunocompetent adults include the following: upper airway cough syndrome (UACS), previously referred to as postnasal drip syndrome (PNDS), which is caused by variety of rhinosinus conditions.  Most likely this is Classic Upper airway cough syndrome, so named because it's frequently impossible to sort out how much is chronic rhinitis/sinusitis with freq throat clearing.  Try at night allergy medications since this is when cough is the worst. If things are not improving, go ahead and fill the prednisone taper but watch out to not catch any other illness as it can suppress your immune system. Suppress your cough and throat clearing with candy and delsym, not throat lozenges or cough drops.  If you are getting worse with more fever and chills, coughing up productive colored phlegm, I will call you in an antibiotic though it would be best to see you back in the office so we can make sure we are choosing the right medication.   Hay Fever Hay fever is an allergic reaction to particles in the air. It cannot be passed from person to person. It cannot be cured, but it can be controlled. CAUSES  Hay fever is caused by something that triggers an allergic reaction (allergens). The following are examples of allergens:  Ragweed.  Feathers.  Animal dander.  Grass and tree pollens.  Cigarette smoke.  House dust.  Pollution. SYMPTOMS   Sneezing.  Runny or stuffy nose.  Tearing eyes.  Itchy eyes, nose, mouth, throat, skin, or other area.  Sore throat.  Headache.  Decreased sense of smell or taste. DIAGNOSIS Your caregiver will perform a physical exam and ask questions about the symptoms you are having.Allergy testing may be done to determine exactly what triggers your hay fever.  TREATMENT   Over-the-counter medicines may help symptoms.  These include:  Antihistamines.  Decongestants. These may help with nasal congestion.  Your caregiver may prescribe medicines if over-the-counter medicines do not work.  Some people benefit from allergy shots when other medicines are not helpful. HOME CARE INSTRUCTIONS   Avoid the allergen that is causing your symptoms, if possible.  Take all medicine as told by your caregiver. SEEK MEDICAL CARE IF:   You have severe allergy symptoms and your current medicines are not helping.  Your treatment was working at one time, but you are now experiencing symptoms.  You have sinus congestion and pressure.  You develop a fever or headache.  You have thick nasal discharge.  You have asthma and have a worsening cough and wheezing. SEEK IMMEDIATE MEDICAL CARE IF:   You have swelling of your tongue or lips.  You have trouble breathing.  You feel lightheaded or like you are going to faint.  You have cold sweats.  You have a fever. Document Released: 08/20/2005 Document Revised: 11/12/2011 Document Reviewed: 11/15/2010 [facility] Patient Information 2015 [place], [place]. This information is not intended to replace advice given to you by your health care provider. Make sure you discuss any questions you have with your health care provider. Cough, Adult  A cough is a reflex that helps clear your throat and airways. It can help heal the body or may be a reaction to an irritated airway. A cough may only last 2 or 3 weeks (acute) or may last more than 8 weeks (chronic).  CAUSES Acute cough:  Viral or bacterial infections. Chronic cough:  Infections.  Allergies.  Asthma.  Post-nasal drip.  Smoking.  Heartburn or acid reflux.  Some medicines.  Chronic lung problems (COPD).  Cancer. SYMPTOMS   Cough.  Fever.  Chest pain.  Increased breathing rate.  High-pitched whistling sound when breathing (wheezing).  Colored mucus that you cough up (sputum). TREATMENT   A  bacterial cough may be treated with antibiotic medicine.  A viral cough must run its course and will not respond to antibiotics.  Your caregiver may recommend other treatments if you have a chronic cough. HOME CARE INSTRUCTIONS   Only take over-the-counter or prescription medicines for pain, discomfort, or fever as directed by your caregiver. Use cough suppressants only as directed by your caregiver.  Use a cold steam vaporizer or humidifier in your bedroom or home to help loosen secretions.  Sleep in a semi-upright position if your cough is worse at night.  Rest as needed.  Stop smoking if you smoke. SEEK IMMEDIATE MEDICAL CARE IF:   You have pus in your sputum.  Your cough starts to worsen.  You cannot control your cough with suppressants and are losing sleep.  You begin coughing up blood.  You have difficulty breathing.  You develop pain which is getting worse or is uncontrolled with medicine.  You have a fever. MAKE SURE YOU:   Understand these instructions.  Will watch your condition.  Will get help right away if you are not doing well or get worse. Document Released: 02/16/2011 Document Revised: 11/12/2011 Document Reviewed: 02/16/2011 [facility] Patient Information 2015 [place], [place]. This information is not intended to replace advice given to you by your health care provider. Make sure you discuss any questions you have with your health care provider.

## 2014-12-13 LAB — FERRITIN: FERRITIN: 20 ng/mL (ref 10–291)

## 2014-12-14 NOTE — Progress Notes (Signed)
LMOM to give [place] a call back to schedule an appt for a follow up of cough with Dr Brigitte Pulse in 3-4 weeks

## 2014-12-19 ENCOUNTER — Encounter: Payer: Self-pay | Admitting: Family Medicine

## 2015-01-14 ENCOUNTER — Ambulatory Visit (INDEPENDENT_AMBULATORY_CARE_PROVIDER_SITE_OTHER): Payer: PRIVATE HEALTH INSURANCE

## 2015-01-14 ENCOUNTER — Encounter: Payer: Self-pay | Admitting: Family Medicine

## 2015-01-14 ENCOUNTER — Ambulatory Visit (INDEPENDENT_AMBULATORY_CARE_PROVIDER_SITE_OTHER): Payer: PRIVATE HEALTH INSURANCE | Admitting: Family Medicine

## 2015-01-14 VITALS — BP 114/62 | HR 94 | Temp 98.6°F | Resp 16 | Ht 66.0 in | Wt 247.6 lb

## 2015-01-14 DIAGNOSIS — S93602A Unspecified sprain of left foot, initial encounter: Secondary | ICD-10-CM | POA: Diagnosis not present

## 2015-01-14 MED ORDER — OMEPRAZOLE 40 MG PO CPDR: 40.0000 mg | DELAYED_RELEASE_CAPSULE | Freq: Every day | ORAL | Status: DC

## 2015-01-14 MED ORDER — MELOXICAM 15 MG PO TABS: 15.0000 mg | ORAL_TABLET | Freq: Every day | ORAL | Status: DC

## 2015-01-14 MED ORDER — MOMETASONE FUROATE 50 MCG/ACT NA SUSP: 2.0000 | Freq: Every day | NASAL | Status: DC

## 2015-01-14 NOTE — Progress Notes (Deleted)
Subjective:    Patient ID: Laurie Monroe, female    DOB: [birth year], 44 y.o.   MRN: [id number] This chart was scribed for Delman Cheadle, MD by Zola Button, Medical Scribe. This patient was seen in Room 25 and the patient's care was started at 4:34 PM.   HPI HPI Comments: Laurie Monroe is a 44 y.o. female who presents to the [facility] for a follow-up for cough.  Saw her 1 month ago for 3 months of dry cough. She had an extensive workup and treatment. When last seen 1 month ago, she did have small leukocytosis with left shift. Suspected cough was due to allergic rhinitis with postnasal drip. She was placed on a course of prednisone followed by Zyrtec and Singulair as well as started on Zantac and as needed promethazine cough syrup.  Cough: Her dry cough has improved significantly, but has not resolved. Patient periodically clears her throat. She has been not been using the cough syrup every night; it does provide relief to her cough. She has been taking ranitidine once a day, in the mornings, instead of twice a day. Patient has not been needing to use the albuterol inhaler. She did fine on the prednisone; it improved the cough, but did not resolve the cough. She has not been using cough drops or lozenges. Patient does chew fruit-flavored gum. She has not been on omeprazole or similar medications in the past. She has used Flonase in the past, but she disliked using it. She does not use a humidifier at home. Patient denies sore throat and nasal congestion. She has been sleeping well without daytime sleepiness or fatigue.  Left Ankle Pain: Patient reports having gradual onset, intermittent left ankle pain that started about 1 week ago after she was doing step exercises at the gym. She thinks she may have missed a step while doing the exercise, but she does not recall any specific injuries. She has some intermittent pain over her left proximal foot with flexion. Patient has been using  ibuprofen occasionally. She has not used any ice. Patient denies significant swelling to the area. She has been on Mobic in the past and did well on it.   Review of Systems  Constitutional: Negative for fatigue.  HENT: Negative for congestion and sore throat.   Respiratory: Positive for cough.   Musculoskeletal: Positive for arthralgias. Negative for joint swelling.       Objective:  BP 114/62 mmHg   Pulse 94   Temp(Src) 98.6 F (37 C) (Oral)   Resp 16   Ht 5\' 6"  (1.676 m)   Wt 247 lb 9.6 oz (112.311 kg)   BMI 39.98 kg/m2   SpO2 97%   LMP 11/27/2014  Physical Exam  Constitutional: She is oriented to person, place, and time. She appears well-developed and well-nourished. No distress.  HENT:  Head: Normocephalic and atraumatic.  Right Ear: Tympanic membrane is erythematous and retracted.  Left Ear: Tympanic membrane normal.  Nose: Mucosal edema present.  Mouth/Throat: Posterior oropharyngeal erythema present. No oropharyngeal exudate.  Nasal mucosal edema and erythema; very dry. Oropharynx with 2+ tonsils at baseline and mild erythema.  Eyes: Pupils are equal, round, and reactive to light.  Neck: Neck supple.  Cardiovascular: Normal rate, regular rhythm, S1 normal, S2 normal and normal heart sounds.   No murmur heard. Pulses:      Dorsalis pedis pulses are 2+ on the right side, and 2+ on the left side.  Posterior tibial pulses are 2+ on the right side, and 2+ on the left side.  Pulmonary/Chest: Effort normal and breath sounds normal. No respiratory distress. She has no wheezes. She has no rales.  CTAB. Great air movement throughout.  Musculoskeletal: She exhibits tenderness. She exhibits no edema.  No TTP over lateral ulnar medial malleolus or surrounding ligaments. No tenderness over proximal 5th metatarsal. No tenderness over CF ligament. Point tenderness over proximal 3rd to 4th tarsal-metatarsal joints on the dorsal surface of the foot.  Neurological: She is alert and  oriented to person, place, and time. No cranial nerve deficit.  Skin: Skin is warm and dry. No rash noted.  Psychiatric: She has a normal mood and affect. Her behavior is normal.  Vitals reviewed.  [facility] reading (PRIMARY) by  Dr. Brigitte Pulse. Left foot xray: no acute abnormality    Assessment & Plan:   1. Foot sprain, left, initial encounter     Orders Placed This Encounter  Procedures   DG Foot Complete Left    Standing Status: Future     Number of Occurrences: 1     Standing Expiration Date: 01/20/2015    Order Specific Question:  Reason for Exam (SYMPTOM  OR DIAGNOSIS REQUIRED)    Answer:  injury, tender over proximal 3rd and 4th metatarsals    Order Specific Question:  Is the patient pregnant?    Answer:  No    Order Specific Question:  Preferred imaging location?    Answer:  External    Meds ordered this encounter  Medications   MULTIPLE VITAMIN PO    Sig: Take by mouth daily.   meloxicam (MOBIC) 15 MG tablet    Sig: Take 1 tablet (15 mg total) by mouth daily.    Dispense:  30 tablet    Refill:  1   omeprazole (PRILOSEC) 40 MG capsule    Sig: Take 1 capsule (40 mg total) by mouth daily.    Dispense:  30 capsule    Refill:  1   mometasone (NASONEX) 50 MCG/ACT nasal spray    Sig: Place 2 sprays into the nose at bedtime.    Dispense:  17 g    Refill:  1    I personally performed the services described in this documentation, which was scribed in my presence. The recorded information has been reviewed and considered, and addended by me as needed.  Delman Cheadle, MD MPH

## 2015-01-14 NOTE — Patient Instructions (Signed)
Foot Sprain The muscles and cord like structures which attach muscle to bone (tendons) that surround the feet are made up of units. A foot sprain can occur at the weakest spot in any of these units. This condition is most often caused by injury to or overuse of the foot, as from playing contact sports, or aggravating a previous injury, or from poor conditioning, or obesity. SYMPTOMS  Pain with movement of the foot.  Tenderness and swelling at the injury site.  Loss of strength is present in moderate or severe sprains. THE THREE GRADES OR SEVERITY OF FOOT SPRAIN ARE:  Mild (Grade I): Slightly pulled muscle without tearing of muscle or tendon fibers or loss of strength.  Moderate (Grade II): Tearing of fibers in a muscle, tendon, or at the attachment to bone, with small decrease in strength.  Severe (Grade III): Rupture of the muscle-tendon-bone attachment, with separation of fibers. Severe sprain requires surgical repair. Often repeating (chronic) sprains are caused by overuse. Sudden (acute) sprains are caused by direct injury or over-use. DIAGNOSIS  Diagnosis of this condition is usually by your own observation. If problems continue, a caregiver may be required for further evaluation and treatment. X-rays may be required to make sure there are not breaks in the bones (fractures) present. Continued problems may require physical therapy for treatment. PREVENTION  Use strength and conditioning exercises appropriate for your sport.  Warm up properly prior to working out.  Use athletic shoes that are made for the sport you are participating in.  Allow adequate time for healing. Early return to activities makes repeat injury more likely, and can lead to an unstable arthritic foot that can result in prolonged disability. Mild sprains generally heal in 3 to 10 days, with moderate and severe sprains taking 2 to 10 weeks. Your caregiver can help you determine the proper time required for  healing. HOME CARE INSTRUCTIONS   Apply ice to the injury for 15-20 minutes, 03-04 times per day. Put the ice in a plastic bag and place a towel between the bag of ice and your skin.  An elastic wrap (like an Ace bandage) may be used to keep swelling down.  Keep foot above the level of the heart, or at least raised on a footstool, when swelling and pain are present.  Try to avoid use other than gentle range of motion while the foot is painful. Do not resume use until instructed by your caregiver. Then begin use gradually, not increasing use to the point of pain. If pain does develop, decrease use and continue the above measures, gradually increasing activities that do not cause discomfort, until you gradually achieve normal use.  Use crutches if and as instructed, and for the length of time instructed.  Keep injured foot and ankle wrapped between treatments.  Massage foot and ankle for comfort and to keep swelling down. Massage from the toes up towards the knee.  Only take over-the-counter or prescription medicines for pain, discomfort, or fever as directed by your caregiver. SEEK IMMEDIATE MEDICAL CARE IF:   Your pain and swelling increase, or pain is not controlled with medications.  You have loss of feeling in your foot or your foot turns cold or blue.  You develop new, unexplained symptoms, or an increase of the symptoms that brought you to your caregiver. MAKE SURE YOU:   Understand these instructions.  Will watch your condition.  Will get help right away if you are not doing well or get worse. Document Released:   02/09/2002 Document Revised: 11/12/2011 Document Reviewed: 04/08/2008 [employer] Patient Information 2015 [employer]. This information is not intended to replace advice given to you by your health care provider. Make sure you discuss any questions you have with your health care provider.  

## 2015-01-24 NOTE — Progress Notes (Signed)
Subjective:    Patient ID: Laurie Monroe, female    DOB: 24-May-1971, 44 y.o.   MRN: [id number] This chart was scribed for Delman Cheadle, MD by Zola Button, Medical Scribe. This patient was seen in Room 25 and the patient's care was started at 4:34 PM.   Ankle Pain  Pertinent negatives include no numbness.   Chief Complaint  Patient presents with  . Follow-up    cough  . Ankle Pain    LEFT x 1 week    HPI Comments: Laurie Monroe is a 44 y.o. female who presents to the [facility] for a follow-up for cough.  Saw her 1 month ago for 3 months of dry cough. She had an extensive workup and treatment. When last seen 1 month ago, she did have small leukocytosis with left shift. Suspected cough was due to allergic rhinitis with postnasal drip. She was placed on a course of prednisone followed by Zyrtec and Singulair as well as started on Zantac and as needed promethazine cough syrup.  Cough: Her dry cough has improved significantly, but has not resolved. Patient periodically clears her throat. She has been not been using the cough syrup every night; it does provide relief to her cough. She has been taking ranitidine once a day, in the mornings, instead of twice a day. Patient has not been needing to use the albuterol inhaler. She did fine on the prednisone; it improved the cough, but did not resolve the cough. She has not been using cough drops or lozenges. Patient does chew fruit-flavored gum. She has not been on omeprazole or similar medications in the past. She has used Flonase in the past, but she disliked using it. She does not use a humidifier at home. Patient denies sore throat and nasal congestion. She has been sleeping well without daytime sleepiness or fatigue.  Left Ankle Pain: Patient reports having gradual onset, intermittent left ankle pain that started about 1 week ago after she was doing step exercises at the gym. She thinks she may have missed a step while doing the  exercise, but she does not recall any specific injuries. She has some intermittent pain over her left proximal foot with flexion. Patient has been using ibuprofen occasionally. She has not used any ice. Patient denies significant swelling to the area. She has been on Mobic in the past and did well on it.  Past Medical History  Diagnosis Date  . Anemia   . Seizures   . Allergy    Current Outpatient Prescriptions on File Prior to Visit  Medication Sig Dispense Refill  . albuterol (PROVENTIL HFA;VENTOLIN HFA) 108 (90 BASE) MCG/ACT inhaler Inhale 2 puffs into the lungs every 6 (six) hours as needed for wheezing or shortness of breath. 1 Inhaler 0  . aspirin EC 81 MG tablet Take 1 tablet (81 mg total) by mouth daily. 100 tablet 3  . cetirizine (ZYRTEC) 10 MG tablet Take 1 tablet (10 mg total) by mouth at bedtime. 30 tablet 11  . montelukast (SINGULAIR) 10 MG tablet Take 1 tablet (10 mg total) by mouth at bedtime. 30 tablet 3  . ranitidine (ZANTAC) 150 MG tablet Take 1 tablet (150 mg total) by mouth 2 (two) times daily. 60 tablet 0   No current facility-administered medications on file prior to visit.   No Known Allergies   Review of Systems  Constitutional: Negative for fever, chills, diaphoresis, activity change, appetite change, fatigue and unexpected weight change.  HENT: Positive for postnasal drip. Negative for congestion, rhinorrhea, sore throat, trouble swallowing and voice change.   Respiratory: Positive for cough. Negative for apnea, chest tightness, shortness of breath and wheezing.   Gastrointestinal: Positive for abdominal pain.  Musculoskeletal: Positive for arthralgias and gait problem. Negative for myalgias, back pain and joint swelling.  Neurological: Negative for weakness and numbness.  Psychiatric/Behavioral: Negative for sleep disturbance.       Objective:  BP 114/62 mmHg  Pulse 94  Temp(Src) 98.6 F (37 C) (Oral)  Resp 16  Ht 5\' 6"  (1.676 m)  Wt 247 lb 9.6 oz  (112.311 kg)  BMI 39.98 kg/m2  SpO2 97%  LMP 11/27/2014  Physical Exam  Constitutional: She is oriented to person, place, and time. She appears well-developed and well-nourished. No distress.  HENT:  Head: Normocephalic and atraumatic.  Right Ear: Tympanic membrane is erythematous and retracted.  Left Ear: Tympanic membrane normal.  Nose: Mucosal edema present.  Mouth/Throat: Posterior oropharyngeal erythema present. No oropharyngeal exudate.  Nasal mucosal edema and erythema; very dry. Oropharynx with 2+ tonsils at baseline and mild erythema.  Eyes: Pupils are equal, round, and reactive to light.  Neck: Neck supple.  Cardiovascular: Normal rate, regular rhythm, S1 normal, S2 normal and normal heart sounds.   No murmur heard. Pulses:      Dorsalis pedis pulses are 2+ on the right side, and 2+ on the left side.       Posterior tibial pulses are 2+ on the right side, and 2+ on the left side.  Pulmonary/Chest: Effort normal and breath sounds normal. No respiratory distress. She has no wheezes. She has no rales.  CTAB. Great air movement throughout.  Musculoskeletal: She exhibits tenderness. She exhibits no edema.  No TTP over lateral ulnar medial malleolus or surrounding ligaments. No tenderness over proximal 5th metatarsal. No tenderness over CF ligament. Point tenderness over proximal 3rd to 4th tarsal-metatarsal joints on the dorsal surface of the foot.  Neurological: She is alert and oriented to person, place, and time. No cranial nerve deficit.  Skin: Skin is warm and dry. No rash noted.  Psychiatric: She has a normal mood and affect. Her behavior is normal.  Vitals reviewed.  [facility] reading (PRIMARY) by  Dr. Brigitte Pulse. Left foot xray: no acute abnormality    Assessment & Plan:   1. Foot sprain, left, initial encounter     Orders Placed This Encounter  Procedures  . DG Foot Complete Left    Standing Status: Future     Number of Occurrences: 1     Standing Expiration Date:  01/20/2015    Order Specific Question:  Reason for Exam (SYMPTOM  OR DIAGNOSIS REQUIRED)    Answer:  injury, tender over proximal 3rd and 4th metatarsals    Order Specific Question:  Is the patient pregnant?    Answer:  No    Order Specific Question:  Preferred imaging location?    Answer:  External    Meds ordered this encounter  Medications  . MULTIPLE VITAMIN PO    Sig: Take by mouth daily.  . meloxicam (MOBIC) 15 MG tablet    Sig: Take 1 tablet (15 mg total) by mouth daily.    Dispense:  30 tablet    Refill:  1  . omeprazole (PRILOSEC) 40 MG capsule    Sig: Take 1 capsule (40 mg total) by mouth daily.    Dispense:  30 capsule    Refill:  1  . mometasone (NASONEX) 50  MCG/ACT nasal spray    Sig: Place 2 sprays into the nose at bedtime.    Dispense:  17 g    Refill:  1    I personally performed the services described in this documentation, which was scribed in my presence. The recorded information has been reviewed and considered, and addended by me as needed.  Delman Cheadle, MD MPH

## 2015-04-18 ENCOUNTER — Ambulatory Visit (INDEPENDENT_AMBULATORY_CARE_PROVIDER_SITE_OTHER): Payer: PRIVATE HEALTH INSURANCE | Admitting: Physician Assistant

## 2015-04-18 VITALS — BP 120/82 | HR 69 | Temp 98.8°F | Resp 18 | Ht 66.0 in | Wt 244.0 lb

## 2015-04-18 DIAGNOSIS — I872 Venous insufficiency (chronic) (peripheral): Secondary | ICD-10-CM | POA: Insufficient documentation

## 2015-04-18 DIAGNOSIS — R600 Localized edema: Secondary | ICD-10-CM

## 2015-04-18 LAB — COMPREHENSIVE METABOLIC PANEL
ALK PHOS: 69 U/L (ref 33–115)
ALT: 9 U/L (ref 6–29)
AST: 14 U/L (ref 10–30)
Albumin: 3.7 g/dL (ref 3.6–5.1)
BUN: 8 mg/dL (ref 7–25)
CO2: 25 mmol/L (ref 20–31)
Calcium: 9.1 mg/dL (ref 8.6–10.2)
Chloride: 103 mmol/L (ref 98–110)
Creat: 0.71 mg/dL (ref 0.50–1.10)
GLUCOSE: 80 mg/dL (ref 65–99)
POTASSIUM: 4.1 mmol/L (ref 3.5–5.3)
Sodium: 139 mmol/L (ref 135–146)
Total Bilirubin: 0.6 mg/dL (ref 0.2–1.2)
Total Protein: 6.9 g/dL (ref 6.1–8.1)

## 2015-04-18 NOTE — Progress Notes (Signed)
   Subjective:    Patient ID: Laurie Monroe, female    DOB: [birth year], 44 y.o.   MRN: [id number]  Chief Complaint  Patient presents with  . Edema    off and on for mths now-started again yesterday    Medications, allergies, past medical history, surgical history, family history, social history and problem list reviewed and updated.  HPI  10 yof presents with bilateral LE edema.   Initially noticed 3-4 months ago. Maybe once week. Unsure if worse after long day of work but sometimes does improve when she elevates legs at night. Swelling is most prominent bilateral feet and sometimes into ankles. Has never had this before. Works as Forensic scientist all day.   Denies cp, sob, increased doe, orthopnea, pnd. No cardiac hx. Her brother has chf and is awaiting a heart transplant. States they are unsure of the cause of hf in brother but that he is a drinker and smoker. She denies heart issues in mom or 2 other siblings. Her father has afib but no known hf.   Denies unilateral leg swelling or leg pain. Not on exogenous estrogen. Non smoker. No hx dvts. She takes mobic for pain approx 1-2 times week.   Review of Systems No fevers, chills. See HPI.     Objective:   Physical Exam  Constitutional: She is oriented to person, place, and time. She appears well-developed and well-nourished.  Non-toxic appearance. She does not have a sickly appearance. She does not appear ill. No distress.  BP 120/82 mmHg  Pulse 69  Temp(Src) 98.8 F (37.1 C) (Oral)  Resp 18  Ht 5\' 6"  (1.676 m)  Wt 244 lb (110.678 kg)  BMI 39.40 kg/m2  SpO2 99%  LMP 04/06/2015   Neck: No hepatojugular reflux and no JVD present. No thyroid mass and no thyromegaly present.  Cardiovascular: Normal rate, regular rhythm, S1 normal, S2 normal and normal heart sounds.  PMI is not displaced.  Exam reveals no S3 and no S4.   No murmur heard. Pulses:      Dorsalis pedis pulses are 2+ on the right side, and 2+ on the left side.   Trace pitting edema to ankles bilaterally. Right slightly worse than left.   Pulmonary/Chest: Effort normal and breath sounds normal. She has no rales.  Neurological: She is alert and oriented to person, place, and time.      Assessment & Plan:   Bilateral edema of lower extremity - Plan: Brain natriuretic peptide, Comprehensive metabolic panel, TSH  Chronic venous insufficiency --suspect venous insuff as cause with typical improvement at night and no assoc sx to suggest cardiac or other cause --will check for thyroid/liver/renal/cardiac cause with labs -- exam normal today other than trace edema --limit mobic as could be contributing --compression stockings, elevate at night, limit sodium, recommend wt loss --rtc if no improvement with these measures  Julieta Gutting, PA-C Physician Assistant-Certified [facility]  04/18/2015 11:08 AM

## 2015-04-18 NOTE — Patient Instructions (Signed)
We are checking labs today to look at your heart, liver, kidneys, and thyroid.  Your swelling is most likely from venous insufficiency as below.  Wearing compression stockings during the day and elevating your legs at night can help. Limiting sodium and even a small amount of weight loss can help.  Please let us know if the swelling doesn't improve with these measures.   Venous Stasis or Chronic Venous Insufficiency Chronic venous insufficiency, also called venous stasis, is a condition that affects the veins in the legs. The condition prevents blood from being pumped through these veins effectively. Blood may no longer be pumped effectively from the legs back to the heart. This condition can range from mild to severe. With proper treatment, you should be able to continue with an active life. CAUSES  Chronic venous insufficiency occurs when the vein walls become stretched, weakened, or damaged or when valves within the vein are damaged. Some common causes of this include:  High blood pressure inside the veins (venous hypertension).  Increased blood pressure in the leg veins from long periods of sitting or standing.  A blood clot that blocks blood flow in a vein (deep vein thrombosis).  Inflammation of a superficial vein (phlebitis) that causes a blood clot to form. RISK FACTORS Various things can make you more likely to develop chronic venous insufficiency, including:  Family history of this condition.  Obesity.  Pregnancy.  Sedentary lifestyle.  Smoking.  Jobs requiring long periods of standing or sitting in one place.  Being a certain age. Women in their 29s and 56s and men in their 31s are more likely to develop this condition. SIGNS AND SYMPTOMS  Symptoms may include:   Varicose veins.  Skin breakdown or ulcers.  Reddened or discolored skin on the leg.  Brown, smooth, tight, and painful skin just above the ankle, usually on the inside surface  (lipodermatosclerosis).  Swelling. DIAGNOSIS  To diagnose this condition, your health care provider will take a medical history and do a physical exam. The following tests may be ordered to confirm the diagnosis:  Duplex ultrasound--A procedure that produces a picture of a blood vessel and nearby organs and also provides information on blood flow through the blood vessel.  Plethysmography--A procedure that tests blood flow.  A venogram, or venography--A procedure used to look at the veins using X-ray and dye. TREATMENT The goals of treatment are to help you return to an active life and to minimize pain or disability. Treatment will depend on the severity of the condition. Medical procedures may be needed for severe cases. Treatment options may include:   Use of compression stockings. These can help with symptoms and lower the chances of the problem getting worse, but they do not cure the problem.  Sclerotherapy--A procedure involving an injection of a material that "dissolves" the damaged veins. Other veins in the network of blood vessels take over the function of the damaged veins.  Surgery to remove the vein or cut off blood flow through the vein (vein stripping or laser ablation surgery).  Surgery to repair a valve. HOME CARE INSTRUCTIONS   Wear compression stockings as directed by your health care provider.  Only take over-the-counter or prescription medicines for pain, discomfort, or fever as directed by your health care provider.  Follow up with your health care provider as directed. SEEK MEDICAL CARE IF:   You have redness, swelling, or increasing pain in the affected area.  You see a red streak or line that extends  up or down from the affected area.  You have a breakdown or loss of skin in the affected area, even if the breakdown is small.  You have an injury to the affected area. SEEK IMMEDIATE MEDICAL CARE IF:   You have an injury and open wound in the affected  area.  Your pain is severe and does not improve with medicine.  You have sudden numbness or weakness in the foot or ankle below the affected area, or you have trouble moving your foot or ankle.  You have a fever or persistent symptoms for more than 2-3 days.  You have a fever and your symptoms suddenly get worse. MAKE SURE YOU:   Understand these instructions.  Will watch your condition.  Will get help right away if you are not doing well or get worse. Document Released: 12/24/2006 Document Revised: 06/10/2013 Document Reviewed: 04/27/2013 [facility] Patient Information 2015 [place], [place]. This information is not intended to replace advice given to you by your health care provider. Make sure you discuss any questions you have with your health care provider.

## 2015-04-19 LAB — TSH: TSH: 1.031 u[IU]/mL (ref 0.350–4.500)

## 2015-04-19 LAB — BRAIN NATRIURETIC PEPTIDE: Brain Natriuretic Peptide: 54.5 pg/mL (ref 0.0–100.0)

## 2015-10-07 ENCOUNTER — Telehealth: Payer: Self-pay | Admitting: Behavioral Health

## 2015-10-07 NOTE — Telephone Encounter (Signed)
Attempted to reach patient at time of Pre-Visit Call. Unable to leave a message; the patient's voice mailbox is full.

## 2015-10-10 ENCOUNTER — Encounter: Payer: Self-pay | Admitting: Family Medicine

## 2015-10-10 ENCOUNTER — Ambulatory Visit (INDEPENDENT_AMBULATORY_CARE_PROVIDER_SITE_OTHER): Payer: PRIVATE HEALTH INSURANCE | Admitting: Family Medicine

## 2015-10-10 VITALS — BP 132/88 | HR 78 | Temp 99.1°F | Ht 66.0 in | Wt 245.6 lb

## 2015-10-10 DIAGNOSIS — R600 Localized edema: Secondary | ICD-10-CM | POA: Diagnosis not present

## 2015-10-10 DIAGNOSIS — R079 Chest pain, unspecified: Secondary | ICD-10-CM

## 2015-10-10 DIAGNOSIS — K219 Gastro-esophageal reflux disease without esophagitis: Secondary | ICD-10-CM | POA: Diagnosis not present

## 2015-10-10 DIAGNOSIS — R030 Elevated blood-pressure reading, without diagnosis of hypertension: Secondary | ICD-10-CM | POA: Diagnosis not present

## 2015-10-10 DIAGNOSIS — IMO0001 Reserved for inherently not codable concepts without codable children: Secondary | ICD-10-CM | POA: Insufficient documentation

## 2015-10-10 DIAGNOSIS — R609 Edema, unspecified: Secondary | ICD-10-CM | POA: Insufficient documentation

## 2015-10-10 MED ORDER — ASPIRIN EC 81 MG PO TBEC: 81.0000 mg | DELAYED_RELEASE_TABLET | Freq: Every day | ORAL | Status: DC

## 2015-10-10 MED ORDER — HYDROCHLOROTHIAZIDE 25 MG PO TABS
25.0000 mg | ORAL_TABLET | Freq: Every day | ORAL | Status: DC
Start: 2015-10-10 — End: 2016-02-14

## 2015-10-10 NOTE — Patient Instructions (Addendum)
   Food Choices for Gastroesophageal Reflux Disease, Adult When you have gastroesophageal reflux disease (GERD), the foods you eat and your eating habits are very important. Choosing the right foods can help ease the discomfort of GERD. WHAT GENERAL GUIDELINES DO I NEED TO FOLLOW?  Choose fruits, vegetables, whole grains, low-fat dairy products, and low-fat meat, fish, and poultry.  Limit fats such as oils, salad dressings, butter, nuts, and avocado.  Keep a food diary to identify foods that cause symptoms.  Avoid foods that cause reflux. These may be different for different people.  Eat frequent small meals instead of three large meals each day.  Eat your meals slowly, in a relaxed setting.  Limit fried foods.  Cook foods using methods other than frying.  Avoid drinking alcohol.  Avoid drinking large amounts of liquids with your meals.  Avoid bending over or lying down until 2-3 hours after eating. WHAT FOODS ARE NOT RECOMMENDED? The following are some foods and drinks that may worsen your symptoms: Vegetables Tomatoes. Tomato juice. Tomato and spaghetti sauce. Chili peppers. Onion and garlic. Horseradish. Fruits Oranges, grapefruit, and lemon (fruit and juice). Meats High-fat meats, fish, and poultry. This includes hot dogs, ribs, ham, sausage, salami, and bacon. Dairy Whole milk and chocolate milk. Sour cream. Cream. Butter. Ice cream. Cream cheese.  Beverages Coffee and tea, with or without caffeine. Carbonated beverages or energy drinks. Condiments Hot sauce. Barbecue sauce.  Sweets/Desserts Chocolate and cocoa. Donuts. Peppermint and spearmint. Fats and Oils High-fat foods, including French fries and potato chips. Other Vinegar. Strong spices, such as black pepper, white pepper, red pepper, cayenne, curry powder, cloves, ginger, and chili powder. The items listed above may not be a complete list of foods and beverages to avoid. Contact your dietitian for more  information.   This information is not intended to replace advice given to you by your health care provider. Make sure you discuss any questions you have with your health care provider.   Document Released: 08/20/2005 Document Revised: 09/10/2014 Document Reviewed: 06/24/2013 Elsevier Interactive Patient Education 2016 Elsevier Inc[employer] DASH Eating Plan DASH stands for "Dietary Approaches to Stop Hypertension." The DASH eating plan is a healthy eating plan that has been shown to reduce high blood pressure (hypertension). Additional health benefits may include reducing the risk of type 2 diabetes mellitus, heart disease, and stroke. The DASH eating plan may also help with weight loss. WHAT DO I NEED TO KNOW ABOUT THE DASH EATING PLAN? For the DASH eating plan, you will follow these general guidelines:  Choose foods with a percent daily value for sodium of less than 5% (as listed on the food label).  Use salt-free seasonings or herbs instead of table salt or sea salt.  Check with your health care provider or pharmacist before using salt substitutes.  Eat lower-sodium products, often labeled as "lower sodium" or "no salt added."  Eat fresh foods.  Eat more vegetables, fruits, and low-fat dairy products.  Choose whole grains. Look for the word "whole" as the first word in the ingredient list.  Choose fish and skinless chicken or turkey more often than red meat. Limit fish, poultry, and meat to 6 oz (170 g) each day.  Limit sweets, desserts, sugars, and sugary drinks.  Choose heart-healthy fats.  Limit cheese to 1 oz (28 g) per day.  Eat more home-cooked food and less restaurant, buffet, and fast food.  Limit fried foods.  Cook foods using methods other than frying.  Limit canned vegetables.   If you do use them, rinse them well to decrease the sodium.  When eating at a restaurant, ask that your food be prepared with less salt, or no salt if possible. WHAT FOODS CAN I EAT? Seek help  from a dietitian for individual calorie needs. Grains Whole grain or whole wheat bread. Brown rice. Whole grain or whole wheat pasta. Quinoa, bulgur, and whole grain cereals. Low-sodium cereals. Corn or whole wheat flour tortillas. Whole grain cornbread. Whole grain crackers. Low-sodium crackers. Vegetables Fresh or frozen vegetables (raw, steamed, roasted, or grilled). Low-sodium or reduced-sodium tomato and vegetable juices. Low-sodium or reduced-sodium tomato sauce and paste. Low-sodium or reduced-sodium canned vegetables.  Fruits All fresh, canned (in natural juice), or frozen fruits. Meat and Other Protein Products Ground beef (85% or leaner), grass-fed beef, or beef trimmed of fat. Skinless chicken or turkey. Ground chicken or turkey. Pork trimmed of fat. All fish and seafood. Eggs. Dried beans, peas, or lentils. Unsalted nuts and seeds. Unsalted canned beans. Dairy Low-fat dairy products, such as skim or 1% milk, 2% or reduced-fat cheeses, low-fat ricotta or cottage cheese, or plain low-fat yogurt. Low-sodium or reduced-sodium cheeses. Fats and Oils Tub margarines without trans fats. Light or reduced-fat mayonnaise and salad dressings (reduced sodium). Avocado. Safflower, olive, or canola oils. Natural peanut or almond butter. Other Unsalted popcorn and pretzels. The items listed above may not be a complete list of recommended foods or beverages. Contact your dietitian for more options. WHAT FOODS ARE NOT RECOMMENDED? Grains White bread. White pasta. White rice. Refined cornbread. Bagels and croissants. Crackers that contain trans fat. Vegetables Creamed or fried vegetables. Vegetables in a cheese sauce. Regular canned vegetables. Regular canned tomato sauce and paste. Regular tomato and vegetable juices. Fruits Dried fruits. Canned fruit in light or heavy syrup. Fruit juice. Meat and Other Protein Products Fatty cuts of meat. Ribs, chicken wings, bacon, sausage, bologna, salami,  chitterlings, fatback, hot dogs, bratwurst, and packaged luncheon meats. Salted nuts and seeds. Canned beans with salt. Dairy Whole or 2% milk, cream, half-and-half, and cream cheese. Whole-fat or sweetened yogurt. Full-fat cheeses or blue cheese. Nondairy creamers and whipped toppings. Processed cheese, cheese spreads, or cheese curds. Condiments Onion and garlic salt, seasoned salt, table salt, and sea salt. Canned and packaged gravies. Worcestershire sauce. Tartar sauce. Barbecue sauce. Teriyaki sauce. Soy sauce, including reduced sodium. Steak sauce. Fish sauce. Oyster sauce. Cocktail sauce. Horseradish. Ketchup and mustard. Meat flavorings and tenderizers. Bouillon cubes. Hot sauce. Tabasco sauce. Marinades. Taco seasonings. Relishes. Fats and Oils Butter, stick margarine, lard, shortening, ghee, and bacon fat. Coconut, palm kernel, or palm oils. Regular salad dressings. Other Pickles and olives. Salted popcorn and pretzels. The items listed above may not be a complete list of foods and beverages to avoid. Contact your dietitian for more information. WHERE CAN I FIND MORE INFORMATION? National Heart, Lung, and Blood Institute: www.nhlbi.nih.gov/health/health-topics/topics/dash/   This information is not intended to replace advice given to you by your health care provider. Make sure you discuss any questions you have with your health care provider.   Document Released: 08/09/2011 Document Revised: 09/10/2014 Document Reviewed: 06/24/2013 Elsevier Interactive Patient Education 2016 Elsevier Inc.  

## 2015-10-10 NOTE — Assessment & Plan Note (Addendum)
ekg -- NSR  Probably reflux rto if it occurs again Chest pain worsen with certain foods

## 2015-10-10 NOTE — Progress Notes (Signed)
Patient ID: Laurie Monroe, female    DOB: May 31, 1971  Age: 45 y.o. MRN: [id number]    Subjective:  Subjective HPI Laurie Monroe presents to establish --- she c/o swelling in her legs b/l -- no cp or sob.  She is struggling with gerd and had an episode of chest pain several years ago and went to er --- nothing was found per pt and she was put on aspirin 81 mg daily.     Review of Systems  Constitutional: Negative for diaphoresis, appetite change, fatigue and unexpected weight change.  Eyes: Negative for pain, redness and visual disturbance.  Respiratory: Positive for cough. Negative for chest tightness, shortness of breath and wheezing.   Cardiovascular: Positive for chest pain and leg swelling. Negative for palpitations.  Gastrointestinal:       Heartburn   Endocrine: Negative for cold intolerance, heat intolerance, polydipsia, polyphagia and polyuria.  Genitourinary: Negative for dysuria, frequency and difficulty urinating.  Neurological: Negative for dizziness, light-headedness, numbness and headaches.    History Past Medical History  Diagnosis Date  . Anemia   . Epilepsy ([place])     Hx of Epilepsy. No seizures in over 20 years  . Allergy   . Chicken pox     She has past surgical history that includes Dilation and curettage of uterus.   Her family history includes Hypertension in her brother, father, and mother; Liver cancer in her father; Prostate cancer in her father; Stomach cancer in her father.She reports that she has never smoked. She has never used smokeless tobacco. She reports that she does not drink alcohol or use illicit drugs.  Current Outpatient Prescriptions on File Prior to Visit  Medication Sig Dispense Refill  . albuterol (PROVENTIL HFA;VENTOLIN HFA) 108 (90 BASE) MCG/ACT inhaler Inhale 2 puffs into the lungs every 6 (six) hours as needed for wheezing or shortness of breath. 1 Inhaler 0  . cetirizine (ZYRTEC) 10 MG tablet Take 1 tablet (10 mg total) by mouth at  bedtime. 30 tablet 11  . meloxicam (MOBIC) 15 MG tablet Take 1 tablet (15 mg total) by mouth daily. 30 tablet 1  . mometasone (NASONEX) 50 MCG/ACT nasal spray Place 2 sprays into the nose at bedtime. 17 g 1  . montelukast (SINGULAIR) 10 MG tablet Take 1 tablet (10 mg total) by mouth at bedtime. 30 tablet 3  . MULTIPLE VITAMIN PO Take by mouth daily.    Marland Kitchen omeprazole (PRILOSEC) 40 MG capsule Take 1 capsule (40 mg total) by mouth daily. (Patient taking differently: Take 40 mg by mouth daily as needed. ) 30 capsule 1   No current facility-administered medications on file prior to visit.     Objective:  Objective Physical Exam  Constitutional: She is oriented to person, place, and time. She appears well-developed and well-nourished.  HENT:  Head: Normocephalic and atraumatic.  Eyes: Conjunctivae and EOM are normal.  Neck: Normal range of motion. Neck supple. No JVD present. Carotid bruit is not present. No thyromegaly present.  Cardiovascular: Normal rate, regular rhythm and normal heart sounds.   No murmur heard. Pulmonary/Chest: Effort normal and breath sounds normal. No respiratory distress. She has no wheezes. She has no rales. She exhibits no tenderness.  Abdominal: Soft. She exhibits no mass. There is tenderness in the epigastric area. There is no rebound and no guarding.  Musculoskeletal: She exhibits edema. She exhibits no tenderness.  Neurological: She is alert and oriented to person, place, and time.  Psychiatric: She has a normal  mood and affect. Her behavior is normal. Judgment and thought content normal.  Nursing note and vitals reviewed.  BP 132/88 mmHg  Pulse 78  Temp(Src) 99.1 F (37.3 C) (Oral)  Ht 5\' 6"  (1.676 m)  Wt 245 lb 9.6 oz (111.403 kg)  BMI 39.66 kg/m2  SpO2 98%  LMP 10/05/2015 Wt Readings from Last 3 Encounters:  10/10/15 245 lb 9.6 oz (111.403 kg)  04/18/15 244 lb (110.678 kg)  01/14/15 247 lb 9.6 oz (112.311 kg)     Lab Results  Component Value Date    WBC 11.5* 12/12/2014   HGB 11.9* 12/12/2014   HCT 38.7 12/12/2014   PLT 298 08/15/2010   GLUCOSE 80 04/18/2015   ALT 9 04/18/2015   AST 14 04/18/2015   NA 139 04/18/2015   K 4.1 04/18/2015   CL 103 04/18/2015   CREATININE 0.71 04/18/2015   BUN 8 04/18/2015   CO2 25 04/18/2015   TSH 1.031 04/18/2015   HGBA1C 5.2 10/07/2014    US Venous Img Lower Unilateral Right  07/24/2014  CLINICAL DATA:  Post greater saphenous vein ablation 07/16/2014, pain and lump now at medial RIGHT upper thigh since surgery, chest pain and LEFT hand tingling EXAM: RIGHT LOWER EXTREMITY VENOUS DOPPLER ULTRASOUND TECHNIQUE: Gray-scale sonography with graded compression, as well as color Doppler and duplex ultrasound were performed to evaluate the lower extremity deep venous systems from the level of the common femoral vein and including the common femoral, femoral, profunda femoral, popliteal and calf veins including the posterior tibial, peroneal and gastrocnemius veins when visible. The superficial great saphenous vein was also interrogated. Spectral Doppler was utilized to evaluate flow at rest and with distal augmentation maneuvers in the common femoral, femoral and popliteal veins. COMPARISON:  None FINDINGS: Contralateral Common Femoral Vein: Respiratory phasicity is normal and symmetric with the symptomatic side. No evidence of thrombus. Normal compressibility. Common Femoral Vein: No evidence of thrombus. Normal compressibility, respiratory phasicity and response to augmentation. Saphenofemoral Junction: No evidence of thrombus. Normal compressibility and flow on color Doppler imaging. Profunda Femoral Vein: No evidence of thrombus. Normal compressibility and flow on color Doppler imaging. Femoral Vein: No evidence of thrombus. Normal compressibility, respiratory phasicity and response to augmentation. Popliteal Vein: No evidence of thrombus. Normal compressibility, respiratory phasicity and response to  augmentation. Calf Veins: No evidence of thrombus. Normal compressibility and flow on color Doppler imaging. Superficial Great Saphenous Vein: Thrombosed, containing scattered hypoechoic material throughout its length associated with tortuosity and thickened walls, consistent with history of ablation. Venous Reflux:  None. Other Findings: No soft tissue mass or abnormal fluid collection identified. IMPRESSION: No evidence of deep venous thrombosis in the RIGHT lower extremity. Post ablation and greater saphenous vein, with tortuous thrombosis segment of greater saphenous vein corresponding to the area of palpable concern. Electronically Signed   By: Lavonia Dana M.D.   On: 07/24/2014 16:29     Assessment & Plan:  Plan I have discontinued Ms. Koelling's ranitidine. I am also having her start on hydrochlorothiazide. Additionally, I am having her maintain her albuterol, cetirizine, montelukast, MULTIPLE VITAMIN PO, meloxicam, omeprazole, mometasone, and aspirin EC.  Meds ordered this encounter  Medications  . hydrochlorothiazide (HYDRODIURIL) 25 MG tablet    Sig: Take 1 tablet (25 mg total) by mouth daily. 1 po qd prn    Dispense:  30 tablet    Refill:  11  . aspirin EC 81 MG tablet    Sig: Take 1 tablet (81 mg total) by mouth  daily.    Dispense:  100 tablet    Refill:  3    Problem List Items Addressed This Visit      Unprioritized   Lower extremity edema - Primary    Compression socks hctz Elevated legs  Watch salt in diet      Relevant Medications   hydrochlorothiazide (HYDRODIURIL) 25 MG tablet   GERD (gastroesophageal reflux disease)    Pt has not been taking omeprazole Sent it back into pharmacy Consider gi if no improvement      Elevated BP    hctz 25 mg daily rto 1 month      Chest pain    ekg -- NSR  Probably reflux rto if it occurs again Chest pain worsen with certain foods      Relevant Orders   EKG 12-Lead (Completed)      Follow-up: Return in about 4 weeks  (around 11/07/2015), or if symptoms worsen or fail to improve, for annual exam, fasting, hypertension.  Garnet Koyanagi, DO

## 2015-10-10 NOTE — Assessment & Plan Note (Signed)
hctz 25 mg daily rto 1 month

## 2015-10-10 NOTE — Assessment & Plan Note (Signed)
Compression socks hctz Elevated legs  Watch salt in diet

## 2015-10-10 NOTE — Progress Notes (Signed)
Pre visit review using our clinic review tool, if applicable. No additional management support is needed unless otherwise documented below in the visit note. 

## 2015-10-10 NOTE — Assessment & Plan Note (Signed)
Pt has not been taking omeprazole Sent it back into pharmacy Consider gi if no improvement

## 2015-11-07 ENCOUNTER — Encounter: Payer: Self-pay | Admitting: Family Medicine

## 2015-11-07 ENCOUNTER — Ambulatory Visit (INDEPENDENT_AMBULATORY_CARE_PROVIDER_SITE_OTHER): Payer: PRIVATE HEALTH INSURANCE | Admitting: Family Medicine

## 2015-11-07 VITALS — BP 130/82 | HR 86 | Temp 98.4°F | Ht 66.0 in | Wt 248.6 lb

## 2015-11-07 DIAGNOSIS — I10 Essential (primary) hypertension: Secondary | ICD-10-CM | POA: Diagnosis not present

## 2015-11-07 DIAGNOSIS — R600 Localized edema: Secondary | ICD-10-CM | POA: Diagnosis not present

## 2015-11-07 NOTE — Progress Notes (Signed)
Patient ID: Laurie Monroe, female    DOB: 1970-12-15  Age: 45 y.o. MRN: [id number]    Subjective:  Subjective HPI Laurie Monroe presents for f/u edema and bp.  Pt states the swelling is better.  No cp, no sob.   Review of Systems  Constitutional: Negative for diaphoresis, appetite change, fatigue and unexpected weight change.  Eyes: Negative for pain, redness and visual disturbance.  Respiratory: Negative for cough, chest tightness, shortness of breath and wheezing.   Cardiovascular: Negative for chest pain, palpitations and leg swelling.  Endocrine: Negative for cold intolerance, heat intolerance, polydipsia, polyphagia and polyuria.  Genitourinary: Negative for dysuria, frequency and difficulty urinating.  Neurological: Negative for dizziness, light-headedness, numbness and headaches.    History Past Medical History  Diagnosis Date  . Anemia   . Epilepsy ([place])     Hx of Epilepsy. No seizures in over 20 years  . Allergy   . Chicken pox     She has past surgical history that includes Dilation and curettage of uterus.   Her family history includes Hypertension in her brother, father, and mother; Liver cancer in her father; Prostate cancer in her father; Stomach cancer in her father.She reports that she has never smoked. She has never used smokeless tobacco. She reports that she does not drink alcohol or use illicit drugs.  Current Outpatient Prescriptions on File Prior to Visit  Medication Sig Dispense Refill  . albuterol (PROVENTIL HFA;VENTOLIN HFA) 108 (90 BASE) MCG/ACT inhaler Inhale 2 puffs into the lungs every 6 (six) hours as needed for wheezing or shortness of breath. 1 Inhaler 0  . aspirin EC 81 MG tablet Take 1 tablet (81 mg total) by mouth daily. 100 tablet 3  . cetirizine (ZYRTEC) 10 MG tablet Take 1 tablet (10 mg total) by mouth at bedtime. 30 tablet 11  . hydrochlorothiazide (HYDRODIURIL) 25 MG tablet Take 1 tablet (25 mg total) by mouth daily. 1 po qd prn 30 tablet  11  . meloxicam (MOBIC) 15 MG tablet Take 1 tablet (15 mg total) by mouth daily. 30 tablet 1  . mometasone (NASONEX) 50 MCG/ACT nasal spray Place 2 sprays into the nose at bedtime. 17 g 1  . montelukast (SINGULAIR) 10 MG tablet Take 1 tablet (10 mg total) by mouth at bedtime. 30 tablet 3  . MULTIPLE VITAMIN PO Take by mouth daily.    Marland Kitchen omeprazole (PRILOSEC) 40 MG capsule Take 1 capsule (40 mg total) by mouth daily. (Patient taking differently: Take 40 mg by mouth daily as needed. ) 30 capsule 1   No current facility-administered medications on file prior to visit.     Objective:  Objective Physical Exam  Constitutional: She is oriented to person, place, and time. She appears well-developed and well-nourished.  HENT:  Head: Normocephalic and atraumatic.  Eyes: Conjunctivae and EOM are normal.  Neck: Normal range of motion. Neck supple. No JVD present. Carotid bruit is not present. No thyromegaly present.  Cardiovascular: Normal rate, regular rhythm and normal heart sounds.   No murmur heard. Pulmonary/Chest: Effort normal and breath sounds normal. No respiratory distress. She has no wheezes. She has no rales. She exhibits no tenderness.  Musculoskeletal: She exhibits edema.       Right ankle: She exhibits swelling.       Left ankle: She exhibits swelling.       Feet:  Neurological: She is alert and oriented to person, place, and time.  Psychiatric: She has a normal mood and affect.  Nursing note and vitals reviewed.  BP 130/82 mmHg  Pulse 86  Temp(Src) 98.4 F (36.9 C) (Oral)  Ht _0  (1.676 m)  Wt 248 lb 9.6 oz (112.764 kg)  BMI 40.14 kg/m2  SpO2 98%  LMP 10/05/2015 Wt Readings from Last 3 Encounters:  11/07/15 248 lb 9.6 oz (112.764 kg)  10/10/15 245 lb 9.6 oz (111.403 kg)  04/18/15 244 lb (110.678 kg)     Lab Results  Component Value Date   WBC 11.5* 12/12/2014   HGB 11.9* 12/12/2014   HCT 38.7 12/12/2014   PLT 298 08/15/2010   GLUCOSE 80 04/18/2015   ALT 9  04/18/2015   AST 14 04/18/2015   NA 139 04/18/2015   K 4.1 04/18/2015   CL 103 04/18/2015   CREATININE 0.71 04/18/2015   BUN 8 04/18/2015   CO2 25 04/18/2015   TSH 1.031 04/18/2015   HGBA1C 5.2 10/07/2014    US Venous Img Lower Unilateral Right  07/24/2014  CLINICAL DATA:  Post greater saphenous vein ablation 07/16/2014, pain and lump now at medial RIGHT upper thigh since surgery, chest pain and LEFT hand tingling EXAM: RIGHT LOWER EXTREMITY VENOUS DOPPLER ULTRASOUND TECHNIQUE: Gray-scale sonography with graded compression, as well as color Doppler and duplex ultrasound were performed to evaluate the lower extremity deep venous systems from the level of the common femoral vein and including the common femoral, femoral, profunda femoral, popliteal and calf veins including the posterior tibial, peroneal and gastrocnemius veins when visible. The superficial great saphenous vein was also interrogated. Spectral Doppler was utilized to evaluate flow at rest and with distal augmentation maneuvers in the common femoral, femoral and popliteal veins. COMPARISON:  None FINDINGS: Contralateral Common Femoral Vein: Respiratory phasicity is normal and symmetric with the symptomatic side. No evidence of thrombus. Normal compressibility. Common Femoral Vein: No evidence of thrombus. Normal compressibility, respiratory phasicity and response to augmentation. Saphenofemoral Junction: No evidence of thrombus. Normal compressibility and flow on color Doppler imaging. Profunda Femoral Vein: No evidence of thrombus. Normal compressibility and flow on color Doppler imaging. Femoral Vein: No evidence of thrombus. Normal compressibility, respiratory phasicity and response to augmentation. Popliteal Vein: No evidence of thrombus. Normal compressibility, respiratory phasicity and response to augmentation. Calf Veins: No evidence of thrombus. Normal compressibility and flow on color Doppler imaging. Superficial Great Saphenous  Vein: Thrombosed, containing scattered hypoechoic material throughout its length associated with tortuosity and thickened walls, consistent with history of ablation. Venous Reflux:  None. Other Findings: No soft tissue mass or abnormal fluid collection identified. IMPRESSION: No evidence of deep venous thrombosis in the RIGHT lower extremity. Post ablation and greater saphenous vein, with tortuous thrombosis segment of greater saphenous vein corresponding to the area of palpable concern. Electronically Signed   By: Lavonia Dana M.D.   On: 07/24/2014 16:29     Assessment & Plan:  Plan I am having Ms. Pettijohn maintain her albuterol, cetirizine, montelukast, MULTIPLE VITAMIN PO, meloxicam, omeprazole, mometasone, hydrochlorothiazide, and aspirin EC.  No orders of the defined types were placed in this encounter.    Problem List Items Addressed This Visit    HTN (hypertension) - Primary    con't diuretic improved      Relevant Orders   Comp Met (CMET)   Lipid panel   POCT urinalysis dipstick   Microalbumin / creatinine urine ratio   CBC with Differential/Platelet    Other Visit Diagnoses    Edema of both legs         elevate  legs, compression socks, con't diuretic  Follow-up: Return in about 3 months (around 02/07/2016) for hypertension, edema.  Garnet Koyanagi, DO

## 2015-11-07 NOTE — Assessment & Plan Note (Signed)
con't diuretic improved

## 2015-11-07 NOTE — Assessment & Plan Note (Signed)
Elevate legs con't diuretic Check labs Compression socks--- pt has not gotten them yet

## 2015-11-07 NOTE — Progress Notes (Signed)
Pre visit review using our clinic review tool, if applicable. No additional management support is needed unless otherwise documented below in the visit note. 

## 2015-11-21 ENCOUNTER — Other Ambulatory Visit (INDEPENDENT_AMBULATORY_CARE_PROVIDER_SITE_OTHER): Payer: PRIVATE HEALTH INSURANCE

## 2015-11-21 DIAGNOSIS — I10 Essential (primary) hypertension: Secondary | ICD-10-CM | POA: Diagnosis not present

## 2015-11-21 LAB — COMPREHENSIVE METABOLIC PANEL
ALBUMIN: 4 g/dL (ref 3.5–5.2)
ALT: 13 U/L (ref 0–35)
AST: 15 U/L (ref 0–37)
Alkaline Phosphatase: 77 U/L (ref 39–117)
BUN: 12 mg/dL (ref 6–23)
CHLORIDE: 102 meq/L (ref 96–112)
CO2: 27 mEq/L (ref 19–32)
CREATININE: 0.85 mg/dL (ref 0.40–1.20)
Calcium: 9.4 mg/dL (ref 8.4–10.5)
GFR: 94.01 mL/min (ref >60.00)
Glucose, Bld: 88 mg/dL (ref 70–99)
Potassium: 3.6 mEq/L (ref 3.5–5.1)
SODIUM: 136 meq/L (ref 135–145)
TOTAL PROTEIN: 7.4 g/dL (ref 6.0–8.3)
Total Bilirubin: 0.5 mg/dL (ref 0.2–1.2)

## 2015-11-21 LAB — CBC WITH DIFFERENTIAL/PLATELET
BASOS ABS: 0.1 10*3/uL (ref 0.0–0.1)
Basophils Relative: 0.5 % (ref 0.0–3.0)
Eosinophils Absolute: 0.2 10*3/uL (ref 0.0–0.7)
Eosinophils Relative: 2.2 % (ref 0.0–5.0)
HCT: 37.9 % (ref 36.0–46.0)
Hemoglobin: 11.8 g/dL — ABNORMAL LOW (ref 12.0–15.0)
LYMPHS ABS: 2.8 10*3/uL (ref 0.7–4.0)
Lymphocytes Relative: 26.2 % (ref 12.0–46.0)
MCHC: 31.2 g/dL (ref 30.0–36.0)
MONO ABS: 0.7 10*3/uL (ref 0.1–1.0)
MONOS PCT: 6.8 % (ref 3.0–12.0)
NEUTROS ABS: 7 10*3/uL (ref 1.4–7.7)
NEUTROS PCT: 64.3 % (ref 43.0–77.0)
PLATELETS: 332 10*3/uL (ref 150.0–400.0)
RBC: 5.79 Mil/uL — ABNORMAL HIGH (ref 3.87–5.11)
RDW: 16.8 % — ABNORMAL HIGH (ref 11.5–15.5)
WBC: 10.9 10*3/uL — ABNORMAL HIGH (ref 4.0–10.5)

## 2015-11-21 LAB — POCT URINALYSIS DIPSTICK
Bilirubin, UA: NEGATIVE
Glucose, UA: NEGATIVE
Ketones, UA: NEGATIVE
Leukocytes, UA: NEGATIVE
Nitrite, UA: NEGATIVE
PH UA: 6
PROTEIN UA: NEGATIVE
RBC UA: NEGATIVE
UROBILINOGEN UA: 0.2

## 2015-11-21 LAB — LIPID PANEL
CHOLESTEROL: 141 mg/dL (ref 0–200)
HDL: 51.9 mg/dL (ref >39.00)
LDL CALC: 79 mg/dL (ref 0–99)
NonHDL: 89.32
Total CHOL/HDL Ratio: 3
Triglycerides: 53 mg/dL (ref 0.0–149.0)
VLDL: 10.6 mg/dL (ref 0.0–40.0)

## 2015-11-21 LAB — MICROALBUMIN / CREATININE URINE RATIO
Creatinine,U: 218.8 mg/dL
MICROALB UR: 0.7 mg/dL (ref 0.0–1.9)
Microalb Creat Ratio: 0.3 mg/g (ref 0.0–30.0)

## 2015-11-29 ENCOUNTER — Other Ambulatory Visit: Payer: Self-pay

## 2015-11-29 DIAGNOSIS — D72829 Elevated white blood cell count, unspecified: Secondary | ICD-10-CM

## 2015-12-13 ENCOUNTER — Other Ambulatory Visit: Payer: Self-pay | Admitting: Family Medicine

## 2015-12-15 ENCOUNTER — Other Ambulatory Visit: Payer: Self-pay | Admitting: Family Medicine

## 2015-12-19 ENCOUNTER — Telehealth: Payer: Self-pay | Admitting: Family Medicine

## 2015-12-19 MED ORDER — MONTELUKAST SODIUM 10 MG PO TABS: 10.0000 mg | ORAL_TABLET | Freq: Every day | ORAL | Status: DC

## 2015-12-19 MED ORDER — CETIRIZINE HCL 10 MG PO TABS: 10.0000 mg | ORAL_TABLET | Freq: Every day | ORAL | Status: DC

## 2015-12-19 NOTE — Telephone Encounter (Signed)
Rx faxed.    KP 

## 2015-12-19 NOTE — Telephone Encounter (Signed)
Relation to [id number] Call back number:[phone] Pharmacy: [id number] - [place], [phone] (Phone) [phone] (Fax)         Reason for call:  Patient requesting a refill cetirizine (ZYRTEC) 10 MG tablet and montelukast (SINGULAIR) 10 MG tablet (aware PCP didn't prescribe) as per pharmacy never received. Please advise

## 2015-12-28 ENCOUNTER — Other Ambulatory Visit: Payer: Self-pay | Admitting: Hematology & Oncology

## 2015-12-28 ENCOUNTER — Other Ambulatory Visit (HOSPITAL_BASED_OUTPATIENT_CLINIC_OR_DEPARTMENT_OTHER): Payer: PRIVATE HEALTH INSURANCE

## 2015-12-28 ENCOUNTER — Ambulatory Visit: Payer: PRIVATE HEALTH INSURANCE

## 2015-12-28 ENCOUNTER — Encounter: Payer: Self-pay | Admitting: Hematology & Oncology

## 2015-12-28 DIAGNOSIS — D72829 Elevated white blood cell count, unspecified: Secondary | ICD-10-CM

## 2015-12-28 DIAGNOSIS — D563 Thalassemia minor: Secondary | ICD-10-CM

## 2015-12-28 HISTORY — DX: Thalassemia minor: D56.3

## 2015-12-28 HISTORY — DX: Elevated white blood cell count, unspecified: D72.829

## 2015-12-28 LAB — CBC WITH DIFFERENTIAL (CANCER CENTER ONLY)
BASO#: 0 10*3/uL (ref 0.0–0.2)
BASO%: 0.2 % (ref 0.0–2.0)
EOS%: 1.4 % (ref 0.0–7.0)
Eosinophils Absolute: 0.2 10*3/uL (ref 0.0–0.5)
HCT: 33.3 % — ABNORMAL LOW (ref 34.8–46.6)
HGB: 11.7 g/dL (ref 11.6–15.9)
LYMPH#: 2.9 10*3/uL (ref 0.9–3.3)
LYMPH%: 22.3 % (ref 14.0–48.0)
MCH: 20.8 pg — ABNORMAL LOW (ref 26.0–34.0)
MCHC: 35.1 g/dL (ref 32.0–36.0)
MCV: 59 fL — ABNORMAL LOW (ref 81–101)
MONO#: 1.1 10*3/uL — ABNORMAL HIGH (ref 0.1–0.9)
MONO%: 8.7 % (ref 0.0–13.0)
NEUT#: 8.7 10*3/uL — ABNORMAL HIGH (ref 1.5–6.5)
NEUT%: 67.4 % (ref 39.6–80.0)
Platelets: 368 10*3/uL (ref 145–400)
RBC: 5.63 10*6/uL — AB (ref 3.70–5.32)
RDW: 16.3 % — AB (ref 11.1–15.7)
WBC: 13 10*3/uL — AB (ref 3.9–10.0)

## 2015-12-28 LAB — CHCC SATELLITE - SMEAR

## 2015-12-30 ENCOUNTER — Ambulatory Visit (HOSPITAL_BASED_OUTPATIENT_CLINIC_OR_DEPARTMENT_OTHER): Payer: PRIVATE HEALTH INSURANCE | Admitting: Hematology & Oncology

## 2015-12-30 ENCOUNTER — Encounter: Payer: Self-pay | Admitting: Hematology & Oncology

## 2015-12-30 VITALS — BP 136/86 | HR 86 | Temp 98.7°F | Resp 16 | Ht 66.0 in | Wt 247.0 lb

## 2015-12-30 DIAGNOSIS — D72829 Elevated white blood cell count, unspecified: Secondary | ICD-10-CM

## 2015-12-30 DIAGNOSIS — D563 Thalassemia minor: Secondary | ICD-10-CM | POA: Diagnosis not present

## 2015-12-30 MED ORDER — FOLIC ACID 1 MG PO TABS: 1.0000 mg | ORAL_TABLET | Freq: Every day | ORAL | Status: DC

## 2015-12-30 NOTE — Progress Notes (Signed)
Referral MD  Reason for Referral: Chronic leukocytosis-reactive                               Severe microcytic anemia-alpha thalassemia   Chief Complaint  Patient presents with  . Other    New patient  : My white blood cells are high.  HPI: Laurie Monroe is a very charming 45 year old African-American female. She works at [employer]. She has been there 14 years.  She's had no problem with infections. She's had no rashes. She's had no change in bowel or bladder habits.  She is to have a hysterectomy in July. She has fibroids.  She has been found to have some slight elevation of her white blood cells.  Going back to February 2015, her white cell count was 11.8. In February 2016, her white cell count was 13.7. Most recently, in March 2017, her white cell count was 10.9.  To me, what is more interesting is that she has severe microcytic disease. Her MCV is between 60-65.  There is no family history of sickle cell. She has not had bleeding other than her monthly cycles. She's had no thyroid issues. She's had no joint problems.  With respect to the leukocytosis, she has had a normal white cell differential.  We were kindly asked to see her to see for good help evaluate the leukocytosis.  She does not smoke. She really has rare alcohol use. She has had no surgery.  She had a mammogram yesterday.  She is not a vegetarian.  She has a 29 year old daughter. She is healthy.  There is a history of prostate cancer in the family. Other than that, she does not know of any blood problems in the family. She says there is no sickle cell disease.  Overall, her performance status is ECOG 0.    Past Medical History  Diagnosis Date  . Anemia   . Epilepsy ([place])     Hx of Epilepsy. No seizures in over 20 years  . Allergy   . Chicken pox   . Leukocytosis 12/28/2015  . Thalassemia trait, alpha 12/28/2015  :  Past Surgical History  Procedure Laterality Date  . Dilation and curettage of  uterus      Heavy Periods  :   Current outpatient prescriptions:  .  albuterol (PROVENTIL HFA;VENTOLIN HFA) 108 (90 BASE) MCG/ACT inhaler, Inhale 2 puffs into the lungs every 6 (six) hours as needed for wheezing or shortness of breath., Disp: 1 Inhaler, Rfl: 0 .  aspirin EC 81 MG tablet, Take 1 tablet (81 mg total) by mouth daily., Disp: 100 tablet, Rfl: 3 .  cetirizine (ZYRTEC) 10 MG tablet, Take 1 tablet (10 mg total) by mouth at bedtime., Disp: 30 tablet, Rfl: 11 .  hydrochlorothiazide (HYDRODIURIL) 25 MG tablet, Take 1 tablet (25 mg total) by mouth daily. 1 po qd prn, Disp: 30 tablet, Rfl: 11 .  mometasone (NASONEX) 50 MCG/ACT nasal spray, Place 2 sprays into the nose at bedtime., Disp: 17 g, Rfl: 1 .  montelukast (SINGULAIR) 10 MG tablet, Take 1 tablet (10 mg total) by mouth at bedtime., Disp: 30 tablet, Rfl: 11 .  MULTIPLE VITAMIN PO, Take by mouth daily., Disp: , Rfl:  .  omeprazole (PRILOSEC) 40 MG capsule, Take 1 capsule (40 mg total) by mouth daily. (Patient taking differently: Take 40 mg by mouth daily as needed. ), Disp: 30 capsule, Rfl: 1 .  folic acid (  FOLVITE) 1 MG tablet, Take 1 tablet (1 mg total) by mouth daily., Disp: 90 tablet, Rfl: 3:  :  No Known Allergies:  Family History  Problem Relation Age of Onset  . Hypertension Mother   . Prostate cancer Father   . Hypertension Father   . Hypertension Brother     Twin Brother  . Liver cancer Father   . Stomach cancer Father   :  Social History   Social History  . Marital Status: Single    Spouse Name: N/A  . Number of Children: N/A  . Years of Education: N/A   Occupational History  . CNA    Social History Main Topics  . Smoking status: Never Smoker   . Smokeless tobacco: Never Used  . Alcohol Use: No  . Drug Use: No  . Sexual Activity: Yes   Other Topics Concern  . Not on file   Social History Narrative  :  Pertinent items are noted in HPI.  Exam: @IPVITALS @ Well-developed and well-nourished  African-American female in no obvious distress. Vital signs show temperature of 98.7. Pulse 86. Blood pressure 136/86. Weight is 247 pounds. In neck exam shows no ocular or oral lesions. There are no palpable cervical or supraclavicular lymph nodes. Conjunctiva are pink. Thyroid is nonpalpable. Lungs are clear. Cardiac exam regular rate and rhythm with no murmurs, rubs or bruits. Abdomen is soft. She has good bowel sounds. There is no fluid wave. There is no palpable liver or spleen tip. Back exam shows no tenderness over the spine, ribs or hips. Extremities shows no clubbing, cyanosis or edema. Neurological exam shows no focal neurological deficits.    Recent Labs  12/28/15 1448  WBC 13.0*  HGB 11.7  HCT 33.3*  PLT 368   No results for input(s): NA, K, CL, CO2, GLUCOSE, BUN, CREATININE, CALCIUM in the last 72 hours.  Blood smear review:  Microcytic population of red blood cells. She has numerous target cells. There is no sickle cell. There is no rouleau formation. She has no teardrop cells. I see no nucleated red blood cells. White cells are normal in morphology and maturation. I see no hypersegmented polys. There is no immature myeloid or lymphoid forms. There are no atypical lymphocytes. Platelets are adequate in number and size.  Pathology: None     Assessment and Plan:  Laurie Monroe is a very charming 45 year old African-American female with mild leukocytosis. I believe that this is reactive. I just wonder if this is not related to her fibroids. I think it will be very interesting to see what happens with her white cell count when she has her hysterectomy.  What I find very interesting is the incredibly low MCV. I have to believe that she has thalassemia by her blood smear. She has target cells. I think she needs to be on folic acid. I will send and 1 mg of folic acid daily to her pharmacy.  I just do not see any underlying hematologic issue that we have to worry about.  I would like to  get her back in 6 months. I think this would be very interesting. By then, she would've had her hysterectomy and would be healed up from it.  I spent about 45 minutes with her. I reassured her that I did not think that there is any problem. I told her that the thalassemia should not be causing nor will it cause her any problems unless she gets dehydrated. This may then cause some hemolysis.

## 2016-01-02 LAB — HEMOGLOBINOPATHY EVALUATION
HEMOGLOBIN F QUANTITATION: 0 % (ref 0.0–2.0)
HGB C: 26.2 % — AB
HGB S: 0 %
Hemoglobin A2 Quantitation: 3.4 % — ABNORMAL HIGH (ref 0.7–3.1)
Hgb A: 70.4 % — ABNORMAL LOW (ref 94.0–98.0)

## 2016-01-06 LAB — ALPHA-THALASSEMIA GENOTYPR: PDF: 0

## 2016-02-14 ENCOUNTER — Encounter: Payer: Self-pay | Admitting: Family Medicine

## 2016-02-14 ENCOUNTER — Ambulatory Visit (INDEPENDENT_AMBULATORY_CARE_PROVIDER_SITE_OTHER): Payer: PRIVATE HEALTH INSURANCE | Admitting: Family Medicine

## 2016-02-14 VITALS — BP 120/84 | HR 91 | Temp 98.5°F | Wt 248.0 lb

## 2016-02-14 DIAGNOSIS — I10 Essential (primary) hypertension: Secondary | ICD-10-CM | POA: Diagnosis not present

## 2016-02-14 DIAGNOSIS — R600 Localized edema: Secondary | ICD-10-CM | POA: Diagnosis not present

## 2016-02-14 MED ORDER — FUROSEMIDE 40 MG PO TABS: 40.0000 mg | ORAL_TABLET | Freq: Every day | ORAL | Status: DC

## 2016-02-14 NOTE — Progress Notes (Signed)
Patient ID: Laurie Monroe, female    DOB: 1971/05/30  Age: 45 y.o. MRN: [id number]    Subjective:  Subjective HPI Laurie Monroe presents for htn f/u and c/o edema low ext.  No cp , no sob.  hctz not helping edema.    Review of Systems  Constitutional: Negative for diaphoresis, appetite change, fatigue and unexpected weight change.  Eyes: Negative for pain, redness and visual disturbance.  Respiratory: Negative for cough, chest tightness, shortness of breath and wheezing.   Cardiovascular: Positive for leg swelling. Negative for chest pain and palpitations.  Endocrine: Negative for cold intolerance, heat intolerance, polydipsia, polyphagia and polyuria.  Genitourinary: Negative for dysuria, frequency and difficulty urinating.  Neurological: Negative for dizziness, light-headedness, numbness and headaches.    History Past Medical History  Diagnosis Date  . Anemia   . Epilepsy ([place])     Hx of Epilepsy. No seizures in over 20 years  . Allergy   . Chicken pox   . Leukocytosis 12/28/2015  . Thalassemia trait, alpha 12/28/2015    She has past surgical history that includes Dilation and curettage of uterus.   Her family history includes Hypertension in her brother, father, and mother; Liver cancer in her father; Prostate cancer in her father; Stomach cancer in her father.She reports that she has never smoked. She has never used smokeless tobacco. She reports that she does not drink alcohol or use illicit drugs.  Current Outpatient Prescriptions on File Prior to Visit  Medication Sig Dispense Refill  . albuterol (PROVENTIL HFA;VENTOLIN HFA) 108 (90 BASE) MCG/ACT inhaler Inhale 2 puffs into the lungs every 6 (six) hours as needed for wheezing or shortness of breath. 1 Inhaler 0  . aspirin EC 81 MG tablet Take 1 tablet (81 mg total) by mouth daily. 100 tablet 3  . cetirizine (ZYRTEC) 10 MG tablet Take 1 tablet (10 mg total) by mouth at bedtime. 30 tablet 11  . folic acid (FOLVITE) 1 MG  tablet Take 1 tablet (1 mg total) by mouth daily. 90 tablet 3  . mometasone (NASONEX) 50 MCG/ACT nasal spray Place 2 sprays into the nose at bedtime. 17 g 1  . montelukast (SINGULAIR) 10 MG tablet Take 1 tablet (10 mg total) by mouth at bedtime. 30 tablet 11  . MULTIPLE VITAMIN PO Take by mouth daily.    Marland Kitchen omeprazole (PRILOSEC) 40 MG capsule Take 1 capsule (40 mg total) by mouth daily. (Patient taking differently: Take 40 mg by mouth daily as needed. ) 30 capsule 1   No current facility-administered medications on file prior to visit.     Objective:  Objective Physical Exam  Constitutional: She is oriented to person, place, and time. She appears well-developed and well-nourished.  HENT:  Head: Normocephalic and atraumatic.  Eyes: Conjunctivae and EOM are normal.  Neck: Normal range of motion. Neck supple. No JVD present. Carotid bruit is not present. No thyromegaly present.  Cardiovascular: Normal rate, regular rhythm and normal heart sounds.   No murmur heard. Pulmonary/Chest: Effort normal and breath sounds normal. No respiratory distress. She has no wheezes. She has no rales. She exhibits no tenderness.  Musculoskeletal: She exhibits edema.  Neurological: She is alert and oriented to person, place, and time.  Psychiatric: She has a normal mood and affect.  Nursing note and vitals reviewed.  BP 120/84 mmHg  Pulse 91  Temp(Src) 98.5 F (36.9 C) (Oral)  Wt 248 lb (112.492 kg)  SpO2 97% Wt Readings from Last 3 Encounters:  02/14/16 248 lb (112.492 kg)  12/30/15 247 lb (112.038 kg)  11/07/15 248 lb 9.6 oz (112.764 kg)     Lab Results  Component Value Date   WBC 13.0* 12/28/2015   HGB 11.7 12/28/2015   HCT 33.3* 12/28/2015   PLT 368 12/28/2015   GLUCOSE 88 11/21/2015   CHOL 141 11/21/2015   TRIG 53.0 11/21/2015   HDL 51.90 11/21/2015   LDLCALC 79 11/21/2015   ALT 13 11/21/2015   AST 15 11/21/2015   NA 136 11/21/2015   K 3.6 11/21/2015   CL 102 11/21/2015    CREATININE 0.85 11/21/2015   BUN 12 11/21/2015   CO2 27 11/21/2015   TSH 1.031 04/18/2015   HGBA1C 5.2 10/07/2014   MICROALBUR 0.7 11/21/2015    US Venous Img Lower Unilateral Right  07/24/2014  CLINICAL DATA:  Post greater saphenous vein ablation 07/16/2014, pain and lump now at medial RIGHT upper thigh since surgery, chest pain and LEFT hand tingling EXAM: RIGHT LOWER EXTREMITY VENOUS DOPPLER ULTRASOUND TECHNIQUE: Gray-scale sonography with graded compression, as well as color Doppler and duplex ultrasound were performed to evaluate the lower extremity deep venous systems from the level of the common femoral vein and including the common femoral, femoral, profunda femoral, popliteal and calf veins including the posterior tibial, peroneal and gastrocnemius veins when visible. The superficial great saphenous vein was also interrogated. Spectral Doppler was utilized to evaluate flow at rest and with distal augmentation maneuvers in the common femoral, femoral and popliteal veins. COMPARISON:  None FINDINGS: Contralateral Common Femoral Vein: Respiratory phasicity is normal and symmetric with the symptomatic side. No evidence of thrombus. Normal compressibility. Common Femoral Vein: No evidence of thrombus. Normal compressibility, respiratory phasicity and response to augmentation. Saphenofemoral Junction: No evidence of thrombus. Normal compressibility and flow on color Doppler imaging. Profunda Femoral Vein: No evidence of thrombus. Normal compressibility and flow on color Doppler imaging. Femoral Vein: No evidence of thrombus. Normal compressibility, respiratory phasicity and response to augmentation. Popliteal Vein: No evidence of thrombus. Normal compressibility, respiratory phasicity and response to augmentation. Calf Veins: No evidence of thrombus. Normal compressibility and flow on color Doppler imaging. Superficial Great Saphenous Vein: Thrombosed, containing scattered hypoechoic material throughout  its length associated with tortuosity and thickened walls, consistent with history of ablation. Venous Reflux:  None. Other Findings: No soft tissue mass or abnormal fluid collection identified. IMPRESSION: No evidence of deep venous thrombosis in the RIGHT lower extremity. Post ablation and greater saphenous vein, with tortuous thrombosis segment of greater saphenous vein corresponding to the area of palpable concern. Electronically Signed   By: Lavonia Dana M.D.   On: 07/24/2014 16:29     Assessment & Plan:  Plan I have discontinued Ms. Fry's hydrochlorothiazide. I am also having her start on furosemide. Additionally, I am having her maintain her albuterol, MULTIPLE VITAMIN PO, omeprazole, mometasone, aspirin EC, montelukast, cetirizine, and folic acid.  Meds ordered this encounter  Medications  . furosemide (LASIX) 40 MG tablet    Sig: Take 1 tablet (40 mg total) by mouth daily.    Dispense:  30 tablet    Refill:  3    Problem List Items Addressed This Visit    HTN (hypertension)    Stable D/c hctz -- on lasix for edema      Relevant Medications   furosemide (LASIX) 40 MG tablet   Lower extremity edema - Primary   Relevant Medications   furosemide (LASIX) 40 MG tablet  Follow-up: Return in about 3 months (around 05/16/2016), or if symptoms worsen or fail to improve, for hypertension  edema.  [name], DO

## 2016-02-14 NOTE — Progress Notes (Signed)
Pre visit review using our clinic review tool, if applicable. No additional management support is needed unless otherwise documented below in the visit note. 

## 2016-02-14 NOTE — Patient Instructions (Signed)
Edema °Edema is an abnormal buildup of fluids in your body tissues. Edema is somewhat dependent on gravity to pull the fluid to the lowest place in your body. That makes the condition more common in the legs and thighs (lower extremities). Painless swelling of the feet and ankles is common and becomes more likely as you get older. It is also common in looser tissues, like around your eyes.  °When the affected area is squeezed, the fluid may move out of that spot and leave a dent for a few moments. This dent is called pitting.  °CAUSES  °There are many possible causes of edema. Eating too much salt and being on your feet or sitting for a long time can cause edema in your legs and ankles. Hot weather may make edema worse. Common medical causes of edema include: °· Heart failure. °· Liver disease. °· Kidney disease. °· Weak blood vessels in your legs. °· Cancer. °· An injury. °· Pregnancy. °· Some medications. °· Obesity.  °SYMPTOMS  °Edema is usually painless. Your skin may look swollen or shiny.  °DIAGNOSIS  °Your health care provider may be able to diagnose edema by asking about your medical history and doing a physical exam. You may need to have tests such as X-rays, an electrocardiogram, or blood tests to check for medical conditions that may cause edema.  °TREATMENT  °Edema treatment depends on the cause. If you have heart, liver, or kidney disease, you need the treatment appropriate for these conditions. General treatment may include: °· Elevation of the affected body part above the level of your heart. °· Compression of the affected body part. Pressure from elastic bandages or support stockings squeezes the tissues and forces fluid back into the blood vessels. This keeps fluid from entering the tissues. °· Restriction of fluid and salt intake. °· Use of a water pill (diuretic). These medications are appropriate only for some types of edema. They pull fluid out of your body and make you urinate more often. This  gets rid of fluid and reduces swelling, but diuretics can have side effects. Only use diuretics as directed by your health care provider. °HOME CARE INSTRUCTIONS  °· Keep the affected body part above the level of your heart when you are lying down.   °· Do not sit still or stand for prolonged periods.   °· Do not put anything directly under your knees when lying down. °· Do not wear constricting clothing or garters on your upper legs.   °· Exercise your legs to work the fluid back into your blood vessels. This may help the swelling go down.   °· Wear elastic bandages or support stockings to reduce ankle swelling as directed by your health care provider.   °· Eat a low-salt diet to reduce fluid if your health care provider recommends it.   °· Only take medicines as directed by your health care provider.  °SEEK MEDICAL CARE IF:  °· Your edema is not responding to treatment. °· You have heart, liver, or kidney disease and notice symptoms of edema. °· You have edema in your legs that does not improve after elevating them.   °· You have sudden and unexplained weight gain. °SEEK IMMEDIATE MEDICAL CARE IF:  °· You develop shortness of breath or chest pain.   °· You cannot breathe when you lie down. °· You develop pain, redness, or warmth in the swollen areas.   °· You have heart, liver, or kidney disease and suddenly get edema. °· You have a fever and your symptoms suddenly get worse. °MAKE SURE YOU:  °·   Understand these instructions. °· Will watch your condition. °· Will get help right away if you are not doing well or get worse. °  °This information is not intended to replace advice given to you by your health care provider. Make sure you discuss any questions you have with your health care provider. °  °Document Released: 08/20/2005 Document Revised: 09/10/2014 Document Reviewed: 06/12/2013 °Elsevier Interactive Patient Education ©2016 [employer] ° °

## 2016-02-15 NOTE — Assessment & Plan Note (Signed)
Stable D/c hctz -- on lasix for edema

## 2016-03-15 NOTE — Patient Instructions (Signed)
Your procedure is scheduled on:  Wednesday, March 28, 2016  Enter through the [employer] of [facility] at:  6:00 AM  Pick up the phone at the desk and dial [phone].  Call this number if you have problems the morning of surgery: [phone].  Remember: Do NOT eat food or drink after:  Midnight Tuesday, March 27, 2016  Take these medicines the morning of surgery with a SIP OF WATER:  Omeprazole  Bring Asthma Inhaler day of surgery  Do NOT wear jewelry (body piercing), metal hair clips/bobby pins, make-up, or nail polish. Do NOT wear lotions, powders, or perfumes.  You may wear deodorant. Do NOT shave for 48 hours prior to surgery. Do NOT bring valuables to the hospital. Contacts, dentures, or bridgework may not be worn into surgery.  Leave suitcase in car.  After surgery it may be brought to your room.  For patients admitted to the hospital, checkout time is 11:00 AM the day of discharge.

## 2016-03-16 ENCOUNTER — Encounter (HOSPITAL_COMMUNITY)
Admission: RE | Admit: 2016-03-16 | Discharge: 2016-03-16 | Disposition: A | Discharge status: Home / Self Care | Payer: PRIVATE HEALTH INSURANCE | Source: Ambulatory Visit | Attending: Obstetrics and Gynecology | Admitting: Obstetrics and Gynecology

## 2016-03-16 ENCOUNTER — Encounter (HOSPITAL_COMMUNITY): Payer: Self-pay

## 2016-03-16 DIAGNOSIS — D259 Leiomyoma of uterus, unspecified: Secondary | ICD-10-CM | POA: Diagnosis not present

## 2016-03-16 DIAGNOSIS — Z01812 Encounter for preprocedural laboratory examination: Secondary | ICD-10-CM | POA: Diagnosis not present

## 2016-03-16 HISTORY — DX: Gastro-esophageal reflux disease without esophagitis: K21.9

## 2016-03-16 LAB — COMPREHENSIVE METABOLIC PANEL
ALBUMIN: 3.8 g/dL (ref 3.5–5.0)
ALK PHOS: 66 U/L (ref 38–126)
ALT: 13 U/L — ABNORMAL LOW (ref 14–54)
AST: 16 U/L (ref 15–41)
Anion gap: 7 (ref 5–15)
BILIRUBIN TOTAL: 0.6 mg/dL (ref 0.3–1.2)
BUN: 13 mg/dL (ref 6–20)
CALCIUM: 9 mg/dL (ref 8.9–10.3)
CO2: 24 mmol/L (ref 22–32)
Chloride: 105 mmol/L (ref 101–111)
Creatinine, Ser: 0.77 mg/dL (ref 0.44–1.00)
GFR calc Af Amer: >60 mL/min (ref >60)
GFR calc non Af Amer: >60 mL/min (ref >60)
GLUCOSE: 89 mg/dL (ref 65–99)
Potassium: 4 mmol/L (ref 3.5–5.1)
Sodium: 136 mmol/L (ref 135–145)
TOTAL PROTEIN: 7 g/dL (ref 6.5–8.1)

## 2016-03-16 LAB — CBC
HEMATOCRIT: 32.5 % — AB (ref 36.0–46.0)
HEMOGLOBIN: 11.1 g/dL — AB (ref 12.0–15.0)
MCH: 20.3 pg — ABNORMAL LOW (ref 26.0–34.0)
MCHC: 34.2 g/dL (ref 30.0–36.0)
MCV: 59.5 fL — AB (ref 78.0–100.0)
Platelets: 339 10*3/uL (ref 150–400)
RBC: 5.46 MIL/uL — ABNORMAL HIGH (ref 3.87–5.11)
RDW: 15.9 % — ABNORMAL HIGH (ref 11.5–15.5)
WBC: 12.3 10*3/uL — ABNORMAL HIGH (ref 4.0–10.5)

## 2016-03-27 NOTE — H&P (Signed)
Laurie Monroe is an 45 y.o. female. She was seen in April for her annual gyn exam.  Having regular menses monthly, heavy for 2-3 days, moderate cramps.  She has known myomas, but on exam uterus went from 12 weeks size in 2016 to 16-18 weeks size this year.  Due to this enlargement and heavy menses I have recommended hysterectomy.  She had to accumulate some PAL time before scheduling the surgery.  Pertinent Gynecological History: Last mammogram: normal Date: 2017 Last pap: normal Date: 2016 OB History: G1, P1001   Menstrual History: Patient's last menstrual period was 03/07/2016 (approximate).    Past Medical History:  Diagnosis Date  . Allergy   . Anemia   . Chicken pox   . Epilepsy ([place])    Hx of Epilepsy. No seizures in over 20 years  . GERD (gastroesophageal reflux disease)   . Leukocytosis 12/28/2015  . Seizures ([place])    last episode 20 years ago  . Thalassemia trait, alpha 12/28/2015    Past Surgical History:  Procedure Laterality Date  . DILATION AND CURETTAGE OF UTERUS     Heavy Periods  . VARICOSE VEIN SURGERY      Family History  Problem Relation Age of Onset  . Hypertension Mother   . Prostate cancer Father   . Hypertension Father   . Hypertension Brother     Twin Brother  . Liver cancer Father   . Stomach cancer Father     Social History:  reports that she has never smoked. She has never used smokeless tobacco. She reports that she does not drink alcohol or use drugs.  Allergies: No Known Allergies  No prescriptions prior to admission.    Review of Systems  Respiratory: Negative.   Cardiovascular: Negative.   Gastrointestinal: Negative.   Genitourinary: Negative.     Last menstrual period 03/07/2016. Physical Exam  Constitutional: She appears well-developed and well-nourished.  Neck: Neck supple. No thyromegaly present.  Cardiovascular: Normal rate, regular rhythm and normal heart sounds.   No murmur heard. Respiratory: Effort normal and  breath sounds normal. No respiratory distress. She has no wheezes.  GI: Soft. She exhibits no distension and no mass. There is no tenderness. There is no guarding.  Uterine fundus at U-3  Genitourinary: Vagina normal.  Genitourinary Comments: Uterus 16-18 weeks size No adnexal mass    No results found for this or any previous visit (from the past 24 hour(s)).  No results found.  Assessment/Plan: Symptomatic myomatous uterus that has enlarged significantly over the past year.  Medical and surgical options have been discussed, small possibility of sarcoma has been discussed.  Surgical procedure, risks and chances of relieving her symptoms have been discussed.  Will admit for TAH/bilateral salpingectomy.  Tinea Nobile D 03/27/2016, 9:16 PM

## 2016-03-27 NOTE — Anesthesia Preprocedure Evaluation (Addendum)
Anesthesia Evaluation  Patient identified by MRN, date of birth, ID band Patient awake    Reviewed: Allergy & Precautions, NPO status , Patient's Chart, lab work & pertinent test results  History of Anesthesia Complications Negative for: history of anesthetic complications  Airway Mallampati: II  TM Distance: >3 FB Neck ROM: Full    Dental  (+) Teeth Intact, Dental Advisory Given   Pulmonary neg pulmonary ROS,    Pulmonary exam normal breath sounds clear to auscultation       Cardiovascular + Peripheral Vascular Disease  Normal cardiovascular exam Rhythm:Regular Rate:Normal     Neuro/Psych Seizures -, Well Controlled,  negative psych ROS   GI/Hepatic Neg liver ROS, GERD  Medicated,  Endo/Other  Morbid obesity  Renal/GU negative Renal ROS     Musculoskeletal negative musculoskeletal ROS (+)   Abdominal   Peds  Hematology  (+) Blood dyscrasia, anemia , Alpha thalassemia   Anesthesia Other Findings Day of surgery medications reviewed with the patient.  Reproductive/Obstetrics                            Anesthesia Physical Anesthesia Plan  ASA: III  Anesthesia Plan: General   Post-op Pain Management:    Induction: Intravenous  Airway Management Planned: Oral ETT  Additional Equipment:   Intra-op Plan:   Post-operative Plan: Extubation in OR  Informed Consent: I have reviewed the patients History and Physical, chart, labs and discussed the procedure including the risks, benefits and alternatives for the proposed anesthesia with the patient or authorized representative who has indicated his/her understanding and acceptance.   Dental advisory given  Plan Discussed with: CRNA  Anesthesia Plan Comments: (Risks/benefits of general anesthesia discussed with patient including risk of damage to teeth, lips, gum, and tongue, nausea/vomiting, allergic reactions to medications, and the  possibility of heart attack, stroke and death.  All patient questions answered.  Patient wishes to proceed.)        Anesthesia Quick Evaluation

## 2016-03-28 ENCOUNTER — Inpatient Hospital Stay (HOSPITAL_COMMUNITY): Payer: PRIVATE HEALTH INSURANCE | Admitting: Anesthesiology

## 2016-03-28 ENCOUNTER — Inpatient Hospital Stay (HOSPITAL_COMMUNITY)
Admission: AD | Admit: 2016-03-28 | Discharge: 2016-03-29 | DRG: 742 | Disposition: A | Discharge status: Home / Self Care | Payer: PRIVATE HEALTH INSURANCE | Source: Ambulatory Visit | Attending: Obstetrics and Gynecology | Admitting: Obstetrics and Gynecology

## 2016-03-28 ENCOUNTER — Encounter (HOSPITAL_COMMUNITY)
Admission: AD | Disposition: A | Discharge status: Home / Self Care | Payer: Self-pay | Source: Ambulatory Visit | Attending: Obstetrics and Gynecology

## 2016-03-28 ENCOUNTER — Encounter (HOSPITAL_COMMUNITY): Payer: Self-pay

## 2016-03-28 DIAGNOSIS — Z90710 Acquired absence of both cervix and uterus: Secondary | ICD-10-CM | POA: Diagnosis present

## 2016-03-28 DIAGNOSIS — D251 Intramural leiomyoma of uterus: Principal | ICD-10-CM | POA: Diagnosis present

## 2016-03-28 DIAGNOSIS — D252 Subserosal leiomyoma of uterus: Secondary | ICD-10-CM | POA: Diagnosis present

## 2016-03-28 DIAGNOSIS — Z6841 Body Mass Index (BMI) 40.0 and over, adult: Secondary | ICD-10-CM | POA: Diagnosis not present

## 2016-03-28 DIAGNOSIS — K219 Gastro-esophageal reflux disease without esophagitis: Secondary | ICD-10-CM | POA: Diagnosis present

## 2016-03-28 DIAGNOSIS — G40909 Epilepsy, unspecified, not intractable, without status epilepticus: Secondary | ICD-10-CM | POA: Diagnosis present

## 2016-03-28 DIAGNOSIS — D259 Leiomyoma of uterus, unspecified: Secondary | ICD-10-CM | POA: Diagnosis present

## 2016-03-28 HISTORY — PX: BILATERAL SALPINGECTOMY: SHX5743

## 2016-03-28 HISTORY — PX: ABDOMINAL HYSTERECTOMY: SHX81

## 2016-03-28 LAB — PREGNANCY, URINE: Preg Test, Ur: NEGATIVE

## 2016-03-28 SURGERY — HYSTERECTOMY, ABDOMINAL
Anesthesia: General | Site: Abdomen

## 2016-03-28 MED ORDER — DEXAMETHASONE SODIUM PHOSPHATE 10 MG/ML IJ SOLN
INTRAMUSCULAR | Status: DC | PRN
  Administered 2016-03-28: 10 mg via INTRAVENOUS

## 2016-03-28 MED ORDER — IBUPROFEN 600 MG PO TABS
600.0000 mg | ORAL_TABLET | Freq: Four times a day (QID) | ORAL | Status: DC | PRN
  Administered 2016-03-29: 600 mg via ORAL
  Filled 2016-03-28: qty 1

## 2016-03-28 MED ORDER — OXYCODONE-ACETAMINOPHEN 5-325 MG PO TABS
1.0000 | ORAL_TABLET | ORAL | Status: DC | PRN
  Administered 2016-03-29 (×2): 1 via ORAL
  Filled 2016-03-28 (×2): qty 1

## 2016-03-28 MED ORDER — ALUM & MAG HYDROXIDE-SIMETH 200-200-20 MG/5ML PO SUSP: 30.0000 mL | ORAL | Status: DC | PRN

## 2016-03-28 MED ORDER — MENTHOL 3 MG MT LOZG: 1.0000 | LOZENGE | OROMUCOSAL | Status: DC | PRN

## 2016-03-28 MED ORDER — DIPHENHYDRAMINE HCL 12.5 MG/5ML PO ELIX: 12.5000 mg | ORAL_SOLUTION | Freq: Four times a day (QID) | ORAL | Status: DC | PRN

## 2016-03-28 MED ORDER — FENTANYL CITRATE (PF) 250 MCG/5ML IJ SOLN
INTRAMUSCULAR | Status: AC
  Filled 2016-03-28: qty 5

## 2016-03-28 MED ORDER — PROMETHAZINE HCL 25 MG/ML IJ SOLN: 6.2500 mg | INTRAMUSCULAR | Status: DC | PRN

## 2016-03-28 MED ORDER — DIPHENHYDRAMINE HCL 50 MG/ML IJ SOLN
12.5000 mg | Freq: Four times a day (QID) | INTRAMUSCULAR | Status: DC | PRN
Start: 2016-03-28 — End: 2016-03-29

## 2016-03-28 MED ORDER — SIMETHICONE 80 MG PO CHEW: 80.0000 mg | CHEWABLE_TABLET | Freq: Four times a day (QID) | ORAL | Status: DC | PRN

## 2016-03-28 MED ORDER — MONTELUKAST SODIUM 10 MG PO TABS
10.0000 mg | ORAL_TABLET | Freq: Every day | ORAL | Status: DC
  Administered 2016-03-28: 10 mg via ORAL
  Filled 2016-03-28 (×3): qty 1

## 2016-03-28 MED ORDER — FENTANYL CITRATE (PF) 100 MCG/2ML IJ SOLN
INTRAMUSCULAR | Status: DC | PRN
  Administered 2016-03-28: 150 ug via INTRAVENOUS
  Administered 2016-03-28: 100 ug via INTRAVENOUS
  Administered 2016-03-28: 50 ug via INTRAVENOUS
  Administered 2016-03-28 (×2): 100 ug via INTRAVENOUS

## 2016-03-28 MED ORDER — DEXTROSE-NACL 5-0.45 % IV SOLN
INTRAVENOUS | Status: DC
  Administered 2016-03-28 – 2016-03-29 (×2): via INTRAVENOUS

## 2016-03-28 MED ORDER — FUROSEMIDE 40 MG PO TABS
40.0000 mg | ORAL_TABLET | Freq: Every day | ORAL | Status: DC
  Administered 2016-03-29: 40 mg via ORAL
  Filled 2016-03-28 (×3): qty 1

## 2016-03-28 MED ORDER — MIDAZOLAM HCL 2 MG/2ML IJ SOLN
INTRAMUSCULAR | Status: AC
  Filled 2016-03-28: qty 2

## 2016-03-28 MED ORDER — LIDOCAINE HCL (CARDIAC) 20 MG/ML IV SOLN
INTRAVENOUS | Status: DC | PRN
  Administered 2016-03-28: 100 mg via INTRAVENOUS

## 2016-03-28 MED ORDER — KETOROLAC TROMETHAMINE 30 MG/ML IJ SOLN
30.0000 mg | Freq: Four times a day (QID) | INTRAMUSCULAR | Status: DC
  Administered 2016-03-28 – 2016-03-29 (×2): 30 mg via INTRAVENOUS
  Filled 2016-03-28 (×2): qty 1

## 2016-03-28 MED ORDER — CEFAZOLIN SODIUM-DEXTROSE 2-4 GM/100ML-% IV SOLN
2.0000 g | INTRAVENOUS | Status: AC
  Administered 2016-03-28: 2 g via INTRAVENOUS

## 2016-03-28 MED ORDER — MIDAZOLAM HCL 5 MG/5ML IJ SOLN
INTRAMUSCULAR | Status: DC | PRN
  Administered 2016-03-28: 2 mg via INTRAVENOUS

## 2016-03-28 MED ORDER — ONDANSETRON HCL 4 MG/2ML IJ SOLN: 4.0000 mg | Freq: Four times a day (QID) | INTRAMUSCULAR | Status: DC | PRN

## 2016-03-28 MED ORDER — DEXAMETHASONE SODIUM PHOSPHATE 10 MG/ML IJ SOLN
INTRAMUSCULAR | Status: AC
  Filled 2016-03-28: qty 1

## 2016-03-28 MED ORDER — ONDANSETRON HCL 4 MG/2ML IJ SOLN
INTRAMUSCULAR | Status: DC | PRN
  Administered 2016-03-28: 4 mg via INTRAVENOUS

## 2016-03-28 MED ORDER — ROCURONIUM BROMIDE 100 MG/10ML IV SOLN
INTRAVENOUS | Status: DC | PRN
  Administered 2016-03-28: 50 mg via INTRAVENOUS
  Administered 2016-03-28: 10 mg via INTRAVENOUS

## 2016-03-28 MED ORDER — SCOPOLAMINE 1 MG/3DAYS TD PT72
MEDICATED_PATCH | TRANSDERMAL | Status: AC
  Filled 2016-03-28: qty 1

## 2016-03-28 MED ORDER — SCOPOLAMINE 1 MG/3DAYS TD PT72
1.0000 | MEDICATED_PATCH | Freq: Once | TRANSDERMAL | Status: DC
Start: 2016-03-28 — End: 2016-03-28
  Administered 2016-03-28: 1.5 mg via TRANSDERMAL

## 2016-03-28 MED ORDER — KETOROLAC TROMETHAMINE 30 MG/ML IJ SOLN: 30.0000 mg | Freq: Four times a day (QID) | INTRAMUSCULAR | Status: DC

## 2016-03-28 MED ORDER — HYDROMORPHONE HCL 1 MG/ML IJ SOLN
INTRAMUSCULAR | Status: AC
  Filled 2016-03-28: qty 1

## 2016-03-28 MED ORDER — FENTANYL CITRATE (PF) 100 MCG/2ML IJ SOLN: 25.0000 ug | INTRAMUSCULAR | Status: DC | PRN

## 2016-03-28 MED ORDER — SUGAMMADEX SODIUM 200 MG/2ML IV SOLN
INTRAVENOUS | Status: AC
  Filled 2016-03-28: qty 2

## 2016-03-28 MED ORDER — ROCURONIUM BROMIDE 100 MG/10ML IV SOLN
INTRAVENOUS | Status: AC
  Filled 2016-03-28: qty 1

## 2016-03-28 MED ORDER — PROPOFOL 10 MG/ML IV BOLUS
INTRAVENOUS | Status: AC
  Filled 2016-03-28: qty 20

## 2016-03-28 MED ORDER — NALOXONE HCL 0.4 MG/ML IJ SOLN
INTRAMUSCULAR | Status: DC | PRN
  Administered 2016-03-28 (×3): .4 ug via INTRAVENOUS

## 2016-03-28 MED ORDER — LIDOCAINE HCL (CARDIAC) 20 MG/ML IV SOLN
INTRAVENOUS | Status: AC
  Filled 2016-03-28: qty 5

## 2016-03-28 MED ORDER — LACTATED RINGERS IV SOLN
INTRAVENOUS | Status: DC
  Administered 2016-03-28 (×4): via INTRAVENOUS

## 2016-03-28 MED ORDER — SODIUM CHLORIDE 0.9% FLUSH: 9.0000 mL | INTRAVENOUS | Status: DC | PRN

## 2016-03-28 MED ORDER — ALBUTEROL SULFATE (2.5 MG/3ML) 0.083% IN NEBU: 2.5000 mg | INHALATION_SOLUTION | Freq: Four times a day (QID) | RESPIRATORY_TRACT | Status: DC | PRN

## 2016-03-28 MED ORDER — ALBUTEROL SULFATE HFA 108 (90 BASE) MCG/ACT IN AERS: 2.0000 | INHALATION_SPRAY | Freq: Four times a day (QID) | RESPIRATORY_TRACT | Status: DC | PRN

## 2016-03-28 MED ORDER — PROPOFOL 10 MG/ML IV BOLUS
INTRAVENOUS | Status: DC | PRN
  Administered 2016-03-28: 200 mg via INTRAVENOUS

## 2016-03-28 MED ORDER — NALOXONE HCL 0.4 MG/ML IJ SOLN: 0.4000 mg | INTRAMUSCULAR | Status: DC | PRN

## 2016-03-28 MED ORDER — HYDROMORPHONE 1 MG/ML IV SOLN
INTRAVENOUS | Status: DC
  Administered 2016-03-28: 12:00:00 via INTRAVENOUS
  Administered 2016-03-28: 1.8 mg via INTRAVENOUS
  Administered 2016-03-29: 1 mg via INTRAVENOUS
  Administered 2016-03-29: 0.4 mg via INTRAVENOUS
  Filled 2016-03-28: qty 25

## 2016-03-28 MED ORDER — KETOROLAC TROMETHAMINE 30 MG/ML IJ SOLN: 30.0000 mg | Freq: Once | INTRAMUSCULAR | Status: DC

## 2016-03-28 MED ORDER — HYDROMORPHONE HCL 1 MG/ML IJ SOLN
INTRAMUSCULAR | Status: DC | PRN
  Administered 2016-03-28 (×2): 1 mg via INTRAVENOUS

## 2016-03-28 MED ORDER — PHENYLEPHRINE 40 MCG/ML (10ML) SYRINGE FOR IV PUSH (FOR BLOOD PRESSURE SUPPORT)
PREFILLED_SYRINGE | INTRAVENOUS | Status: AC
  Filled 2016-03-28: qty 10

## 2016-03-28 MED ORDER — SUGAMMADEX SODIUM 200 MG/2ML IV SOLN
INTRAVENOUS | Status: DC | PRN
  Administered 2016-03-28: 200 mg via INTRAVENOUS

## 2016-03-28 MED ORDER — PHENYLEPHRINE HCL 10 MG/ML IJ SOLN
INTRAMUSCULAR | Status: DC | PRN
  Administered 2016-03-28 (×2): 80 ug via INTRAVENOUS
  Administered 2016-03-28: 40 ug via INTRAVENOUS

## 2016-03-28 MED ORDER — ONDANSETRON HCL 4 MG PO TABS: 4.0000 mg | ORAL_TABLET | Freq: Four times a day (QID) | ORAL | Status: DC | PRN

## 2016-03-28 MED ORDER — ONDANSETRON HCL 4 MG/2ML IJ SOLN
INTRAMUSCULAR | Status: AC
  Filled 2016-03-28: qty 2

## 2016-03-28 SURGICAL SUPPLY — 42 items
APL SKNCLS STERI-STRIP NONHPOA (GAUZE/BANDAGES/DRESSINGS) ×2
BENZOIN TINCTURE PRP APPL 2/3 (GAUZE/BANDAGES/DRESSINGS) ×2 IMPLANT
CANISTER SUCT 3000ML (MISCELLANEOUS) ×4 IMPLANT
CLOSURE WOUND 1/2 X4 (GAUZE/BANDAGES/DRESSINGS) ×1
CLOTH BEACON ORANGE TIMEOUT ST (SAFETY) ×4 IMPLANT
CONT PATH 16OZ SNAP LID 3702 (MISCELLANEOUS) ×4 IMPLANT
DECANTER SPIKE VIAL GLASS SM (MISCELLANEOUS) IMPLANT
DRAPE WARM FLUID 44X44 (DRAPE) IMPLANT
DRSG OPSITE POSTOP 4X10 (GAUZE/BANDAGES/DRESSINGS) ×4 IMPLANT
DURAPREP 26ML APPLICATOR (WOUND CARE) ×4 IMPLANT
ELECT LIGASURE SHORT 9 REUSE (ELECTRODE) ×2 IMPLANT
GAUZE SPONGE 4X4 16PLY XRAY LF (GAUZE/BANDAGES/DRESSINGS) IMPLANT
GLOVE BIO SURGEON STRL SZ8 (GLOVE) ×4 IMPLANT
GLOVE BIOGEL PI IND STRL 7.0 (GLOVE) ×6 IMPLANT
GLOVE BIOGEL PI INDICATOR 7.0 (GLOVE) ×6
GLOVE ORTHO TXT STRL SZ7.5 (GLOVE) ×4 IMPLANT
GOWN STRL REUS W/TWL LRG LVL3 (GOWN DISPOSABLE) ×8 IMPLANT
NEEDLE HYPO 22GX1.5 SAFETY (NEEDLE) IMPLANT
NS IRRIG 1000ML POUR BTL (IV SOLUTION) ×4 IMPLANT
PACK ABDOMINAL GYN (CUSTOM PROCEDURE TRAY) ×4 IMPLANT
PAD OB MATERNITY 4.3X12.25 (PERSONAL CARE ITEMS) ×4 IMPLANT
PENCIL SMOKE EVAC W/HOLSTER (ELECTROSURGICAL) ×4 IMPLANT
PROTECTOR NERVE ULNAR (MISCELLANEOUS) ×8 IMPLANT
RETRACTOR WND ALEXIS 25 LRG (MISCELLANEOUS) ×2 IMPLANT
RTRCTR WOUND ALEXIS 25CM LRG (MISCELLANEOUS) ×4
SPONGE LAP 18X18 X RAY DECT (DISPOSABLE) ×8 IMPLANT
STAPLER VISISTAT 35W (STAPLE) ×4 IMPLANT
STRIP CLOSURE SKIN 1/2X4 (GAUZE/BANDAGES/DRESSINGS) ×1 IMPLANT
SUT CHROMIC 1MO 4 18 CR8 (SUTURE) ×12 IMPLANT
SUT PDS AB 0 CTX 60 (SUTURE) IMPLANT
SUT PLAIN 2 0 XLH (SUTURE) ×2 IMPLANT
SUT SILK 2 0 SH (SUTURE) IMPLANT
SUT VIC AB 0 CT1 27 (SUTURE)
SUT VIC AB 0 CT1 27XBRD ANBCTR (SUTURE) IMPLANT
SUT VIC AB 3-0 SH 27 (SUTURE)
SUT VIC AB 3-0 SH 27X BRD (SUTURE) IMPLANT
SUT VIC AB 4-0 KS 27 (SUTURE) ×2 IMPLANT
SUT VICRYL 0 TIES 12 18 (SUTURE) ×4 IMPLANT
SYR CONTROL 10ML LL (SYRINGE) IMPLANT
TOWEL OR 17X24 6PK STRL BLUE (TOWEL DISPOSABLE) ×8 IMPLANT
TRAY FOLEY CATH SILVER 14FR (SET/KITS/TRAYS/PACK) ×4 IMPLANT
WATER STERILE IRR 1000ML POUR (IV SOLUTION) ×4 IMPLANT

## 2016-03-28 NOTE — Anesthesia Postprocedure Evaluation (Signed)
Anesthesia Post Note  Patient: Laurie Monroe  Procedure(s) Performed: Procedure(s) (LRB): HYSTERECTOMY ABDOMINAL (N/A) BILATERAL SALPINGECTOMY (Bilateral)  Patient location during evaluation: [facility] Anesthesia Type: General Level of consciousness: awake, awake and alert, oriented and patient cooperative Pain management: pain level controlled Vital Signs Assessment: post-procedure vital signs reviewed and stable Respiratory status: spontaneous breathing, nonlabored ventilation, respiratory function stable and patient connected to nasal cannula oxygen Cardiovascular status: stable Postop Assessment: no headache, patient able to bend at knees, no backache and no signs of nausea or vomiting Anesthetic complications: no     Last Vitals:  Vitals:   03/28/16 1135 03/28/16 1235  BP: 136/72 126/66  Pulse: (!) 111 (!) 102  Resp: 20 (!) 24  Temp: 37.6 C 36.5 C    Last Pain:  Vitals:   03/28/16 1235  TempSrc: Oral  PainSc:    Pain Goal: Patients Stated Pain Goal: 3 (03/28/16 0622)               Ammaar Encina L

## 2016-03-28 NOTE — Transfer of Care (Signed)
Immediate Anesthesia Transfer of Care Note  Patient: Laurie Monroe  Procedure(s) Performed: Procedure(s): HYSTERECTOMY ABDOMINAL (N/A) BILATERAL SALPINGECTOMY (Bilateral)  Patient Location: PACU  Anesthesia Type:General  Level of Consciousness: awake and sedated  Airway & Oxygen Therapy: Patient Spontanous Breathing and Patient connected to nasal cannula oxygen  Post-op Assessment: Report given to RN and Post -op Vital signs reviewed and stable  Post vital signs: Reviewed and stable  Last Vitals:  Vitals:   03/28/16 0622 03/28/16 0945  BP: 118/90 (!) 158/94  Pulse: 87 (!) 109  Resp: 20 20  Temp: 36.8 C 36.9 C    Last Pain:  Vitals:   03/28/16 0622  TempSrc: Oral      Patients Stated Pain Goal: 3 ([id number])  Complications: No apparent anesthesia complications

## 2016-03-28 NOTE — Op Note (Signed)
Preoperative diagnosis: Symptomatic myomatous uterus Postoperative diagnosis: Same Procedure: Total abdominal hysterectomy, bilateral salpingectomy Surgeon: Cheri Fowler M.D. Assistant: Paula Compton, MD Anesthesia: Combined spinal/epidural Findings: She had a significantly enlarged uterus awith several large myomas, ovaries were normal Estimated blood loss: [id number] Complications: None  Procedure in detail:  The patient was taken to the operating room and placed in the dorsal supine position. General anesthesia was induced, abdomen, perineum and vagina were prepped and draped in the usual sterile fashion and a Foley catheter was placed. Her abdomen was then entered via a standard Pfannenstiel incision. Uterus was then delivered through the incision with some difficulty due to it's size. An Alexis self-retaining retractor was placed and bowels were packed out of the pelvis.   Uterine cornu were grasped with large Kelly clamps. Fallopian tubes were freed with Ligasure and bovie and then removed.  Round ligaments were taken down with electrocautery. On each side a window was made in an avascular portion of the broad ligament, the ovaries and tubes were elevated and the uteroovarian pedicle was taken down with Ligasure. The LigaSure device was then used to take down the broad ligaments, cardinal ligaments and uterine arteries, and most of the way to the vaginal pedicles. There was brisk bleeding from robust uterine arteries, so these were clamped with Zeppelin clamps, pedicles cut and sutured with #1 Chromic. Zeppelin clamps were then used to clamp the vaginal angles on each side and the uterus and cervix with were removed.  Vaginal angles were then sutured with #1 chromic and 2 figure-of-eight sutures of #1 chromic were then used to close the rest of the vaginal cuff. The previously tagged uterosacral pedicles were tied in the midline.  All pedicles were inspected and found to be hemostatic. The pelvis  was irrigated and found to be hemostatic. The lap sponges were removed from the abdomen and the Alexis retractor was removed. Peritoneum was identified and closed with running 2-0 Vicryl. Fascia was closed in a running fashion starting at both ends and meeting in the middle with 0 Vicryl. Subcutaneous tissue was irrigated and made hemostatic with Bovie. Subcutaneous tissue was then closed with running 2-0 plain gut suture. Skin incision was then closed with running subcuticular 4-0 Vicryl followed by steri-strips and a sterile dressing. Patient tolerated procedure well and was taken to the carina in stable condition. Counts were correct, she had PAS hose on throughout the procedure, she received appropriate antibiotic prophylaxis.

## 2016-03-28 NOTE — Progress Notes (Addendum)
Day of Surgery Procedure(s) (LRB): HYSTERECTOMY ABDOMINAL (N/A) BILATERAL SALPINGECTOMY (Bilateral)  Subjective: Patient reports incisional pain and tolerating PO.  Pain controlled.    No N/V  Objective: I have reviewed patient's vital signs and intake and output.  General: alert, cooperative and no distress Resp: clear to auscultation bilaterally Cardio: regular rate and rhythm GI: normal findings: soft, non-tender and incision: clean, dry and intact Extremities: extremities normal, atraumatic, no cyanosis or edema  Assessment: s/p Procedure(s): HYSTERECTOMY ABDOMINAL (N/A) BILATERAL SALPINGECTOMY (Bilateral): stable  Plan: Continue current management    LOS: 0 days    Bovard-Stuckert, Mauricia Mertens 03/28/2016, 5:55 PM

## 2016-03-28 NOTE — Interval H&P Note (Signed)
History and Physical Interval Note:  03/28/2016 7:09 AM  Laurie Monroe  has presented today for surgery, with the diagnosis of Myomas  The various methods of treatment have been discussed with the patient and family. After consideration of risks, benefits and other options for treatment, the patient has consented to  Procedure(s): HYSTERECTOMY ABDOMINAL (N/A) BILATERAL SALPINGECTOMY (Bilateral) as a surgical intervention .  The patient's history has been reviewed, patient examined, no change in status, stable for surgery.  I have reviewed the patient's chart and labs.  Questions were answered to the patient's satisfaction.     Celes Dedic D

## 2016-03-28 NOTE — Addendum Note (Signed)
Addendum  created 03/28/16 1334 by Raenette Rover, CRNA   Sign clinical note

## 2016-03-28 NOTE — Anesthesia Postprocedure Evaluation (Signed)
Anesthesia Post Note  Patient: Laurie Monroe  Procedure(s) Performed: Procedure(s) (LRB): HYSTERECTOMY ABDOMINAL (N/A) BILATERAL SALPINGECTOMY (Bilateral)  Patient location during evaluation: PACU Anesthesia Type: General Level of consciousness: awake and alert Pain management: pain level controlled Vital Signs Assessment: post-procedure vital signs reviewed and stable Respiratory status: spontaneous breathing, nonlabored ventilation, respiratory function stable and patient connected to nasal cannula oxygen Cardiovascular status: blood pressure returned to baseline and stable Postop Assessment: no signs of nausea or vomiting Anesthetic complications: no     Last Vitals:  Vitals:   03/28/16 1115 03/28/16 1135  BP: (!) 144/79 136/72  Pulse: (!) 114 (!) 111  Resp: 19 20  Temp: 37.1 C 37.6 C    Last Pain:  Vitals:   03/28/16 1146  TempSrc:   PainSc: 4    Pain Goal: Patients Stated Pain Goal: 3 (03/28/16 0622)               Catalina Gravel

## 2016-03-28 NOTE — Anesthesia Procedure Notes (Signed)
Procedure Name: Intubation Date/Time: 03/28/2016 7:26 AM Performed by: Riki Sheer Pre-anesthesia Checklist: Patient identified, Emergency Drugs available, Suction available, Patient being monitored and Timeout performed Patient Re-evaluated:Patient Re-evaluated prior to inductionOxygen Delivery Method: Circle system utilized Preoxygenation: Pre-oxygenation with 100% oxygen Intubation Type: IV induction Ventilation: Mask ventilation without difficulty Laryngoscope Size: Miller and 2 Grade View: Grade I Tube type: Oral Tube size: 7.0 mm Number of attempts: 1 Airway Equipment and Method: Stylet Placement Confirmation: ETT inserted through vocal cords under direct vision,  positive ETCO2,  CO2 detector and breath sounds checked- equal and bilateral Secured at: 22 cm Tube secured with: Tape Dental Injury: Teeth and Oropharynx as per pre-operative assessment

## 2016-03-29 LAB — CBC
HCT: 27.7 % — ABNORMAL LOW (ref 36.0–46.0)
Hemoglobin: 9.4 g/dL — ABNORMAL LOW (ref 12.0–15.0)
MCH: 20.1 pg — AB (ref 26.0–34.0)
MCHC: 33.9 g/dL (ref 30.0–36.0)
MCV: 59.2 fL — AB (ref 78.0–100.0)
PLATELETS: 273 10*3/uL (ref 150–400)
RBC: 4.68 MIL/uL (ref 3.87–5.11)
RDW: 16.1 % — AB (ref 11.5–15.5)
WBC: 19 10*3/uL — ABNORMAL HIGH (ref 4.0–10.5)

## 2016-03-29 MED ORDER — OXYCODONE-ACETAMINOPHEN 5-325 MG PO TABS: 1.0000 | ORAL_TABLET | ORAL | 0 refills | Status: DC | PRN

## 2016-03-29 NOTE — Discharge Summary (Signed)
Physician Discharge Summary  Patient ID: Laurie Monroe MRN: [id number] DOB/AGE: 10-16-1970 45 y.o.  Admit date: 03/28/2016 Discharge date: 03/29/2016  Admission Diagnoses:  AUB, symptomatic myomatous uterus  Discharge Diagnoses: Same Active Problems:   S/P abdominal hysterectomy   Discharged Condition: good  Hospital Course: Pt admitted for TAH/bilateral salpingectomy for myomatous uterus with AUB.  Surgery without problems, EBL 500.  She did well post-op, rapidly able to ambulate and tolerate diet, requested discharge on PM POD #1.   Discharge Exam: Blood pressure (!) 105/55, pulse 68, temperature 98.2 F (36.8 C), temperature source Oral, resp. rate 18, height 5\' 6"  (1.676 m), weight 112.9 kg (249 lb), last menstrual period 03/07/2016, SpO2 98 %. General appearance: alert  Disposition: 01-Home or Self Care  Discharge Instructions    Call MD for:  persistant nausea and vomiting    Complete by:  As directed   Call MD for:  redness, tenderness, or signs of infection (pain, swelling, redness, odor or green/yellow discharge around incision site)    Complete by:  As directed   Call MD for:  severe uncontrolled pain    Complete by:  As directed   Call MD for:  temperature >100.4    Complete by:  As directed   Diet - low sodium heart healthy    Complete by:  As directed   Increase activity slowly    Complete by:  As directed   Lifting restrictions    Complete by:  As directed   10 lbs   Sexual Activity Restrictions    Complete by:  As directed   Pelvic rest       Medication List    TAKE these medications   albuterol 108 (90 Base) MCG/ACT inhaler Commonly known as:  PROVENTIL HFA;VENTOLIN HFA Inhale 2 puffs into the lungs every 6 (six) hours as needed for wheezing or shortness of breath.   aspirin EC 81 MG tablet Take 1 tablet (81 mg total) by mouth daily.   cetirizine 10 MG tablet Commonly known as:  ZYRTEC Take 1 tablet (10 mg total) by mouth at bedtime.   folic acid 1  MG tablet Commonly known as:  FOLVITE Take 1 tablet (1 mg total) by mouth daily.   furosemide 40 MG tablet Commonly known as:  LASIX Take 1 tablet (40 mg total) by mouth daily.   mometasone 50 MCG/ACT nasal spray Commonly known as:  NASONEX Place 2 sprays into the nose at bedtime. What changed:  when to take this  reasons to take this   montelukast 10 MG tablet Commonly known as:  SINGULAIR Take 1 tablet (10 mg total) by mouth at bedtime.   MULTIPLE VITAMIN PO Take 1 tablet by mouth daily.   omeprazole 40 MG capsule Commonly known as:  PRILOSEC Take 1 capsule (40 mg total) by mouth daily. What changed:  when to take this  reasons to take this   oxyCODONE-acetaminophen 5-325 MG tablet Commonly known as:  PERCOCET/ROXICET Take 1-2 tablets by mouth every 4 (four) hours as needed for severe pain (moderate to severe pain (when tolerating fluids)).      Follow-up Information    Andriel Omalley D, MD. Schedule an appointment as soon as possible for a visit in 2 week(s).   Specialty:  Obstetrics and Gynecology Contact information: [address] [place] [address] [phone]           Signed: Clarene Duke 03/29/2016, 5:34 PM

## 2016-03-29 NOTE — Progress Notes (Signed)
1 Day Post-Op Procedure(s) (LRB): HYSTERECTOMY ABDOMINAL (N/A) BILATERAL SALPINGECTOMY (Bilateral)  Subjective: Patient reports incisional pain, tolerating PO and no problems voiding.    Objective: I have reviewed patient's vital signs, intake and output and labs.  General: alert GI: soft, dressing C/D/I  Assessment: s/p Procedure(s): HYSTERECTOMY ABDOMINAL (N/A) BILATERAL SALPINGECTOMY (Bilateral): stable and progressing well  Plan: Advance diet Encourage ambulation Advance to PO medication Discontinue IV fluids  LOS: 1 day    Wilmetta Speiser D 03/29/2016, 8:32 AM

## 2016-03-29 NOTE — Progress Notes (Signed)
Discharge teaching complete. Pt understood all instructions and did not have any questions. Pt pushed via wheelchair and discharged home to family. 

## 2016-03-29 NOTE — Discharge Instructions (Signed)
Routine instructions for hysterectomy °

## 2016-03-29 NOTE — Progress Notes (Signed)
Doing ok, wants to go home, has voided , ambulating, + flatus Afeb, VSS Abd-soft Will d/c home

## 2016-04-02 ENCOUNTER — Encounter (HOSPITAL_COMMUNITY): Payer: Self-pay | Admitting: Obstetrics and Gynecology

## 2016-05-17 ENCOUNTER — Ambulatory Visit (INDEPENDENT_AMBULATORY_CARE_PROVIDER_SITE_OTHER): Payer: PRIVATE HEALTH INSURANCE | Admitting: Family Medicine

## 2016-05-17 ENCOUNTER — Encounter: Payer: Self-pay | Admitting: Family Medicine

## 2016-05-17 VITALS — BP 124/82 | HR 94 | Temp 99.0°F | Wt 247.4 lb

## 2016-05-17 DIAGNOSIS — J069 Acute upper respiratory infection, unspecified: Secondary | ICD-10-CM

## 2016-05-17 DIAGNOSIS — D72829 Elevated white blood cell count, unspecified: Secondary | ICD-10-CM | POA: Diagnosis not present

## 2016-05-17 MED ORDER — LEVOCETIRIZINE DIHYDROCHLORIDE 5 MG PO TABS: 5.0000 mg | ORAL_TABLET | Freq: Every evening | ORAL | 5 refills | Status: DC

## 2016-05-17 MED ORDER — FLUTICASONE PROPIONATE 50 MCG/ACT NA SUSP: 2.0000 | Freq: Every day | NASAL | 6 refills | Status: DC

## 2016-05-17 MED ORDER — PROMETHAZINE-DM 6.25-15 MG/5ML PO SYRP: 5.0000 mL | ORAL_SOLUTION | Freq: Four times a day (QID) | ORAL | 0 refills | Status: DC | PRN

## 2016-05-17 NOTE — Patient Instructions (Signed)
Upper Respiratory Infection, Adult Most upper respiratory infections (URIs) are a viral infection of the air passages leading to the lungs. A URI affects the nose, throat, and upper air passages. The most common type of URI is nasopharyngitis and is typically referred to as "the common cold." URIs run their course and usually go away on their own. Most of the time, a URI does not require medical attention, but sometimes a bacterial infection in the upper airways can follow a viral infection. This is called a secondary infection. Sinus and middle ear infections are common types of secondary upper respiratory infections. Bacterial pneumonia can also complicate a URI. A URI can worsen asthma and chronic obstructive pulmonary disease (COPD). Sometimes, these complications can require emergency medical care and may be life threatening.  CAUSES Almost all URIs are caused by viruses. A virus is a type of germ and can spread from one person to another.  RISKS FACTORS You may be at risk for a URI if:   You smoke.   You have chronic heart or lung disease.  You have a weakened defense (immune) system.   You are very young or very old.   You have nasal allergies or asthma.  You work in crowded or poorly ventilated areas.  You work in health care facilities or schools. SIGNS AND SYMPTOMS  Symptoms typically develop 2-3 days after you come in contact with a cold virus. Most viral URIs last 7-10 days. However, viral URIs from the influenza virus (flu virus) can last 14-18 days and are typically more severe. Symptoms may include:   Runny or stuffy (congested) nose.   Sneezing.   Cough.   Sore throat.   Headache.   Fatigue.   Fever.   Loss of appetite.   Pain in your forehead, behind your eyes, and over your cheekbones (sinus pain).  Muscle aches.  DIAGNOSIS  Your health care provider may diagnose a URI by:  Physical exam.  Tests to check that your symptoms are not due to  another condition such as:  Strep throat.  Sinusitis.  Pneumonia.  Asthma. TREATMENT  A URI goes away on its own with time. It cannot be cured with medicines, but medicines may be prescribed or recommended to relieve symptoms. Medicines may help:  Reduce your fever.  Reduce your cough.  Relieve nasal congestion. HOME CARE INSTRUCTIONS   Take medicines only as directed by your health care provider.   Gargle warm saltwater or take cough drops to comfort your throat as directed by your health care provider.  Use a warm mist humidifier or inhale steam from a shower to increase air moisture. This may make it easier to breathe.  Drink enough fluid to keep your urine clear or pale yellow.   Eat soups and other clear broths and maintain good nutrition.   Rest as needed.   Return to work when your temperature has returned to normal or as your health care provider advises. You may need to stay home longer to avoid infecting others. You can also use a face mask and careful hand washing to prevent spread of the virus.  Increase the usage of your inhaler if you have asthma.   Do not use any tobacco products, including cigarettes, chewing tobacco, or electronic cigarettes. If you need help quitting, ask your health care provider. PREVENTION  The best way to protect yourself from getting a cold is to practice good hygiene.   Avoid oral or hand contact with people with cold   symptoms.   Wash your hands often if contact occurs.  There is no clear evidence that vitamin C, vitamin E, echinacea, or exercise reduces the chance of developing a cold. However, it is always recommended to get plenty of rest, exercise, and practice good nutrition.  SEEK MEDICAL CARE IF:   You are getting worse rather than better.   Your symptoms are not controlled by medicine.   You have chills.  You have worsening shortness of breath.  You have brown or red mucus.  You have yellow or brown nasal  discharge.  You have pain in your face, especially when you bend forward.  You have a fever.  You have swollen neck glands.  You have pain while swallowing.  You have white areas in the back of your throat. SEEK IMMEDIATE MEDICAL CARE IF:   You have severe or persistent:  Headache.  Ear pain.  Sinus pain.  Chest pain.  You have chronic lung disease and any of the following:  Wheezing.  Prolonged cough.  Coughing up blood.  A change in your usual mucus.  You have a stiff neck.  You have changes in your:  Vision.  Hearing.  Thinking.  Mood. MAKE SURE YOU:   Understand these instructions.  Will watch your condition.  Will get help right away if you are not doing well or get worse.   This information is not intended to replace advice given to you by your health care provider. Make sure you discuss any questions you have with your health care provider.   Document Released: 02/13/2001 Document Revised: 01/04/2015 Document Reviewed: 11/25/2013 Elsevier Interactive Patient Education 2016 [employer]  

## 2016-05-17 NOTE — Progress Notes (Signed)
Pre visit review using our clinic review tool, if applicable. No additional management support is needed unless otherwise documented below in the visit note. 

## 2016-05-17 NOTE — Progress Notes (Signed)
Patient ID: Laurie Monroe, female    DOB: [birth year]  Age: 45 y.o. MRN: [id number]    Subjective:  Subjective  HPI Laurie Monroe presents for cough, chills and sinus drainage with clear mucous x 3 days.  Only taking nyquil--- with some relief.      Review of Systems  Constitutional: Positive for chills. Negative for fever.  HENT: Positive for congestion, postnasal drip, rhinorrhea, sneezing and sore throat. Negative for sinus pressure.   Respiratory: Positive for cough. Negative for chest tightness, shortness of breath and wheezing.   Cardiovascular: Negative for chest pain, palpitations and leg swelling.  Allergic/Immunologic: Negative for environmental allergies.    History Past Medical History:  Diagnosis Date  . Allergy   . Anemia   . Chicken pox   . Epilepsy ([place])    Hx of Epilepsy. No seizures in over 20 years  . GERD (gastroesophageal reflux disease)   . Leukocytosis 12/28/2015  . Seizures ([place])    last episode 20 years ago  . Thalassemia trait, alpha 12/28/2015    She has a past surgical history that includes Dilation and curettage of uterus; Varicose vein surgery; Abdominal hysterectomy (N/A, 03/28/2016); and Bilateral salpingectomy (Bilateral, 03/28/2016).   Her family history includes Hypertension in her brother, father, and mother; Liver cancer in her father; Prostate cancer in her father; Stomach cancer in her father.She reports that she has never smoked. She has never used smokeless tobacco. She reports that she does not drink alcohol or use drugs.  Current Outpatient Prescriptions on File Prior to Visit  Medication Sig Dispense Refill  . albuterol (PROVENTIL HFA;VENTOLIN HFA) 108 (90 BASE) MCG/ACT inhaler Inhale 2 puffs into the lungs every 6 (six) hours as needed for wheezing or shortness of breath. 1 Inhaler 0  . aspirin EC 81 MG tablet Take 1 tablet (81 mg total) by mouth daily. 100 tablet 3  . cetirizine (ZYRTEC) 10 MG tablet Take 1 tablet (10 mg total) by mouth  at bedtime. 30 tablet 11  . folic acid (FOLVITE) 1 MG tablet Take 1 tablet (1 mg total) by mouth daily. 90 tablet 3  . furosemide (LASIX) 40 MG tablet Take 1 tablet (40 mg total) by mouth daily. 30 tablet 3  . mometasone (NASONEX) 50 MCG/ACT nasal spray Place 2 sprays into the nose at bedtime. (Patient taking differently: Place 2 sprays into the nose daily as needed. ) 17 g 1  . montelukast (SINGULAIR) 10 MG tablet Take 1 tablet (10 mg total) by mouth at bedtime. 30 tablet 11  . MULTIPLE VITAMIN PO Take 1 tablet by mouth daily.     Marland Kitchen omeprazole (PRILOSEC) 40 MG capsule Take 1 capsule (40 mg total) by mouth daily. (Patient taking differently: Take 40 mg by mouth daily as needed. ) 30 capsule 1   No current facility-administered medications on file prior to visit.      Objective:  Objective  Physical Exam  Constitutional: She is oriented to person, place, and time. She appears well-developed and well-nourished.  HENT:  Right Ear: External ear normal.  Left Ear: External ear normal.  + PND + errythema  Eyes: Conjunctivae are normal. Right eye exhibits no discharge. Left eye exhibits no discharge.  Cardiovascular: Normal rate, regular rhythm and normal heart sounds.   No murmur heard. Pulmonary/Chest: Effort normal and breath sounds normal. No respiratory distress. She has no wheezes. She has no rales. She exhibits no tenderness.  Musculoskeletal: She exhibits no edema.  Lymphadenopathy:  She has cervical adenopathy.  Neurological: She is alert and oriented to person, place, and time.  Psychiatric: She has a normal mood and affect. Her behavior is normal. Judgment and thought content normal.  Nursing note and vitals reviewed.  BP 124/82 (BP Location: Left Arm, Patient Position: Sitting, Cuff Size: Large)   Pulse 94   Temp 99 F (37.2 C) (Oral)   Wt 247 lb 6.4 oz (112.2 kg)   LMP 03/28/2016 (Exact Date)   SpO2 98%   BMI 39.93 kg/m  Wt Readings from Last 3 Encounters:  05/17/16  247 lb 6.4 oz (112.2 kg)  03/29/16 249 lb (112.9 kg)  03/16/16 249 lb 8 oz (113.2 kg)     Lab Results  Component Value Date   WBC 19.0 (H) 03/29/2016   HGB 9.4 (L) 03/29/2016   HCT 27.7 (L) 03/29/2016   PLT 273 03/29/2016   GLUCOSE 89 03/16/2016   CHOL 141 11/21/2015   TRIG 53.0 11/21/2015   HDL 51.90 11/21/2015   LDLCALC 79 11/21/2015   ALT 13 (L) 03/16/2016   AST 16 03/16/2016   NA 136 03/16/2016   K 4.0 03/16/2016   CL 105 03/16/2016   CREATININE 0.77 03/16/2016   BUN 13 03/16/2016   CO2 24 03/16/2016   TSH 1.031 04/18/2015   HGBA1C 5.2 10/07/2014   MICROALBUR 0.7 11/21/2015    No results found.   Assessment & Plan:  Plan  I have discontinued Laurie Monroe's oxyCODONE-acetaminophen. I am also having her start on levocetirizine, fluticasone, and promethazine-dextromethorphan. Additionally, I am having her maintain her albuterol, MULTIPLE VITAMIN PO, omeprazole, mometasone, aspirin EC, montelukast, cetirizine, folic acid, and furosemide.  Meds ordered this encounter  Medications  . levocetirizine (XYZAL) 5 MG tablet    Sig: Take 1 tablet (5 mg total) by mouth every evening.    Dispense:  30 tablet    Refill:  5  . fluticasone (FLONASE) 50 MCG/ACT nasal spray    Sig: Place 2 sprays into both nostrils daily.    Dispense:  16 g    Refill:  6  . promethazine-dextromethorphan (PROMETHAZINE-DM) 6.25-15 MG/5ML syrup    Sig: Take 5 mLs by mouth 4 (four) times daily as needed.    Dispense:  118 mL    Refill:  0    Problem List Items Addressed This Visit    None    Visit Diagnoses    Elevated WBC count    -  Primary   Relevant Orders   CBC with Differential/Platelet   Acute upper respiratory infection       Relevant Medications   levocetirizine (XYZAL) 5 MG tablet   fluticasone (FLONASE) 50 MCG/ACT nasal spray   promethazine-dextromethorphan (PROMETHAZINE-DM) 6.25-15 MG/5ML syrup    rto if symptoms do not improve  Follow-up: No Follow-up on file.  [name], DO

## 2016-05-18 ENCOUNTER — Ambulatory Visit: Payer: PRIVATE HEALTH INSURANCE | Admitting: Family Medicine

## 2016-05-20 IMAGING — CR DG CHEST 2V
2 series · 2 of 2 positions shown · non-contrast
Comparison: 10/31/2013

CLINICAL DATA: 43-year-old female with a history of cough for 2
days.

EXAM:
CHEST - 2 VIEW

[PA]
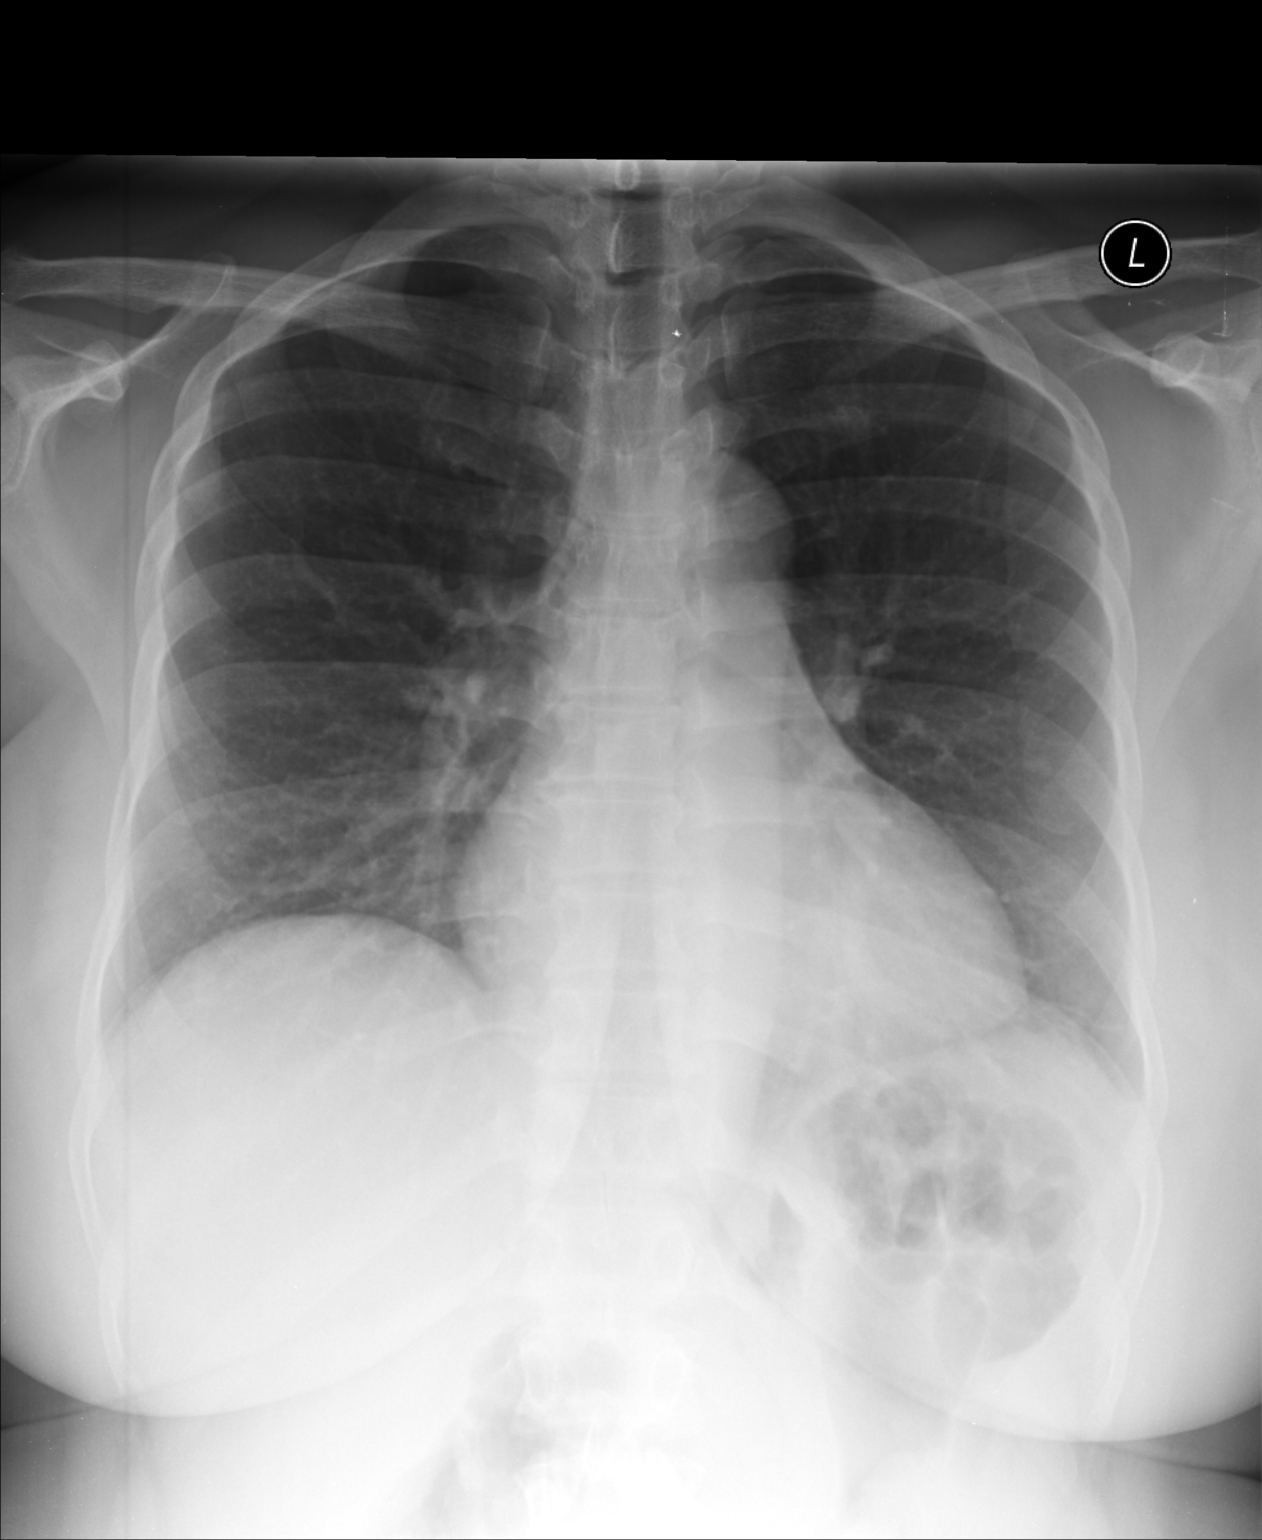

[lateral]
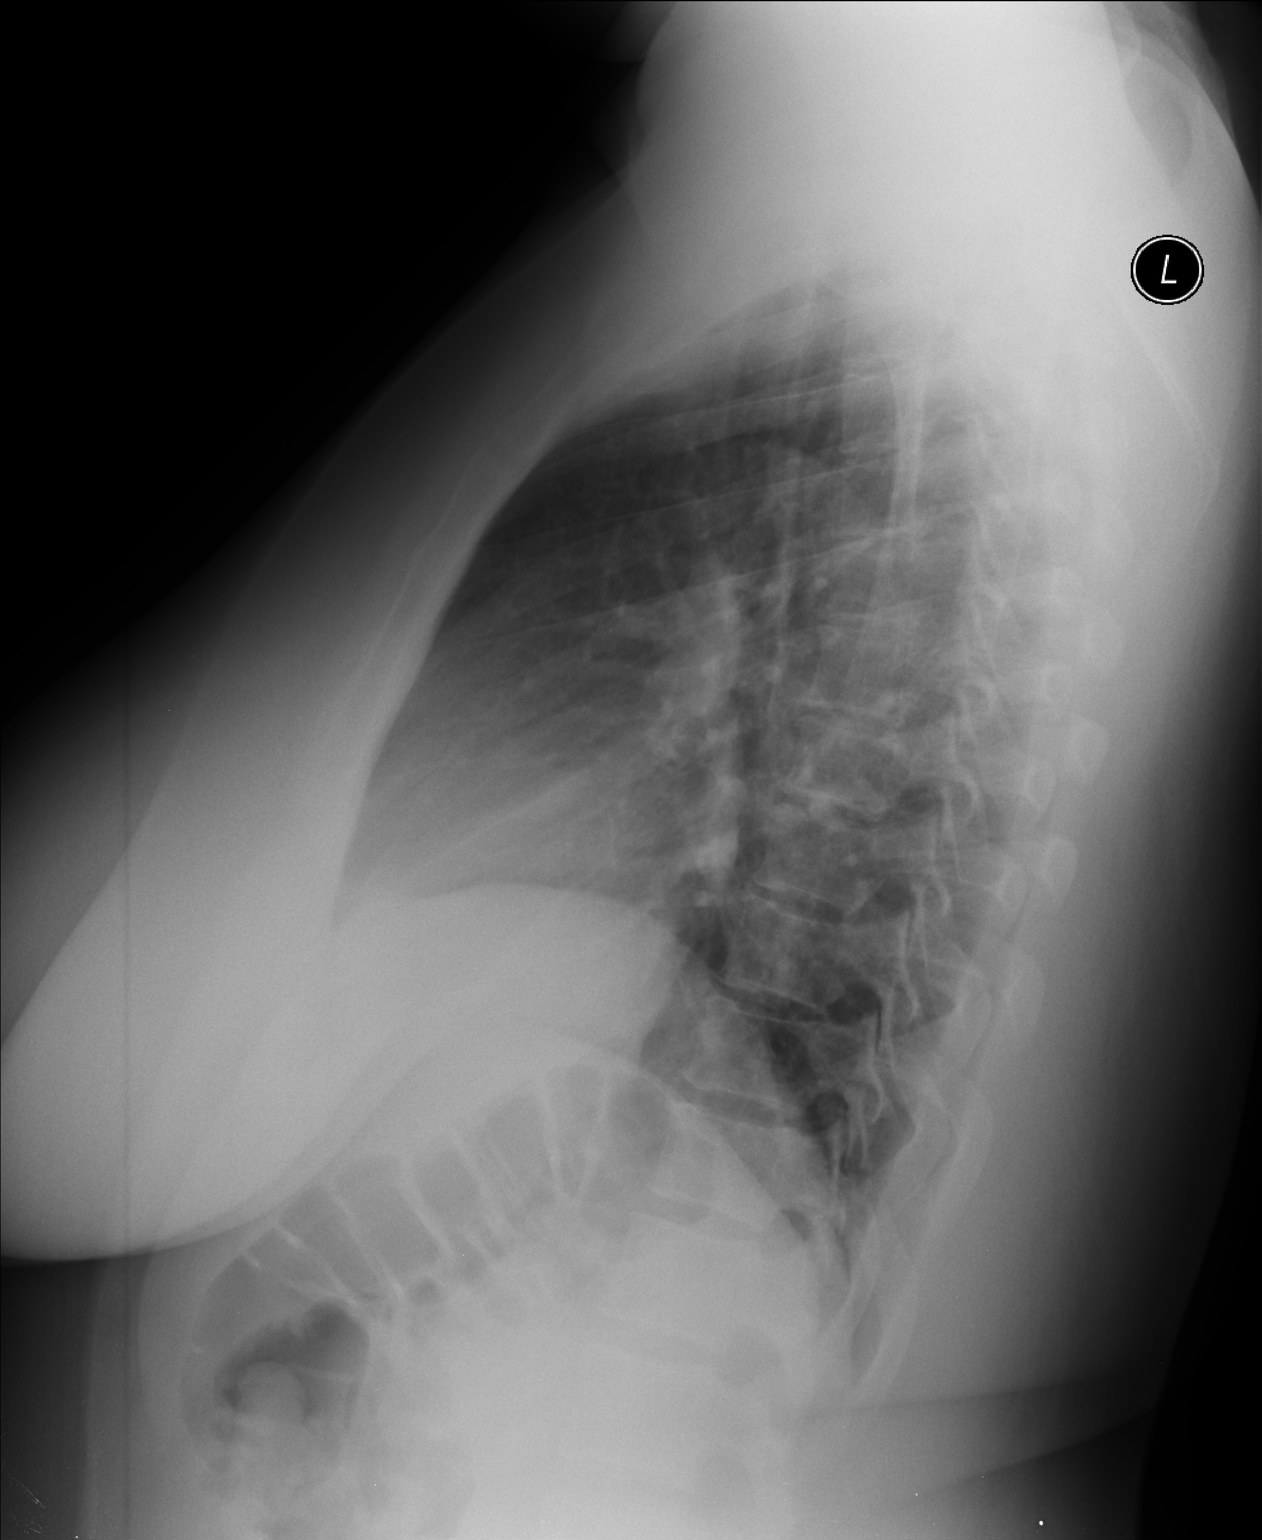

[2 of 2 positions shown; findings below may reference images not displayed]

FINDINGS: Cardiomediastinal silhouette projects within normal limits in size
and contour. No confluent airspace disease, pneumothorax, or pleural
effusion.

No displaced fracture.

Unremarkable appearance of the upper abdomen.
IMPRESSION: No radiographic evidence of acute cardiopulmonary disease.

## 2016-05-22 ENCOUNTER — Ambulatory Visit: Payer: PRIVATE HEALTH INSURANCE | Admitting: Family Medicine

## 2016-07-05 ENCOUNTER — Ambulatory Visit (HOSPITAL_BASED_OUTPATIENT_CLINIC_OR_DEPARTMENT_OTHER): Payer: PRIVATE HEALTH INSURANCE | Admitting: Family

## 2016-07-05 ENCOUNTER — Other Ambulatory Visit (HOSPITAL_BASED_OUTPATIENT_CLINIC_OR_DEPARTMENT_OTHER): Payer: PRIVATE HEALTH INSURANCE

## 2016-07-05 VITALS — BP 124/69 | HR 90 | Temp 98.8°F | Resp 20 | Wt 250.8 lb

## 2016-07-05 DIAGNOSIS — D582 Other hemoglobinopathies: Secondary | ICD-10-CM | POA: Diagnosis not present

## 2016-07-05 DIAGNOSIS — D563 Thalassemia minor: Secondary | ICD-10-CM

## 2016-07-05 LAB — CBC WITH DIFFERENTIAL (CANCER CENTER ONLY)
BASO#: 0.1 10*3/uL (ref 0.0–0.2)
BASO%: 0.4 % (ref 0.0–2.0)
EOS%: 2.4 % (ref 0.0–7.0)
Eosinophils Absolute: 0.4 10*3/uL (ref 0.0–0.5)
HEMATOCRIT: 34 % — AB (ref 34.8–46.6)
HEMOGLOBIN: 11.8 g/dL (ref 11.6–15.9)
LYMPH#: 3.6 10*3/uL — AB (ref 0.9–3.3)
LYMPH%: 25.2 % (ref 14.0–48.0)
MCH: 20 pg — ABNORMAL LOW (ref 26.0–34.0)
MCHC: 34.7 g/dL (ref 32.0–36.0)
MCV: 58 fL — AB (ref 81–101)
MONO#: 1.2 10*3/uL — AB (ref 0.1–0.9)
MONO%: 8.7 % (ref 0.0–13.0)
NEUT%: 63.3 % (ref 39.6–80.0)
NEUTROS ABS: 9.1 10*3/uL — AB (ref 1.5–6.5)
Platelets: 415 10*3/uL — ABNORMAL HIGH (ref 145–400)
RBC: 5.89 10*6/uL — ABNORMAL HIGH (ref 3.70–5.32)
RDW: 18.6 % — AB (ref 11.1–15.7)
WBC: 14.3 10*3/uL — ABNORMAL HIGH (ref 3.9–10.0)

## 2016-07-05 LAB — CHCC SATELLITE - SMEAR

## 2016-07-05 NOTE — Progress Notes (Signed)
Hematology and Oncology Follow Up Visit  Laurie Monroe [id number] 1971-07-03 45 y.o. 07/05/2016   Principle Diagnosis:  Hgb C trait Alpha thalassemia trait  Current Therapy:   Folic acid 2 mg PO daily - increased today     Interim History:  Laurie Monroe is here today for follow-up. She had Laurie Monroe partial hysterectomy on 7/16. She has done well since that but admits that she has not been taking Laurie Monroe medications as she should including Laurie Monroe folic acid.  Laurie Monroe Hgb is 11.8 with an MCV of 58. Laurie Monroe WBC count is 14.3.  She has had no problem with infections. No fever, chills, n/v, cough, rash, dizziness, SOB, chest pain, palpitations, abdominal pain or changes in bowel or bladder habits.  No swelling, tenderness, numbness or tingling in Laurie Monroe extremities. No new aches or pains.  She has maintained a good appetite and is staying well hydrated. Laurie Monroe weight is stable.   Medications:    Medication List       Accurate as of 07/05/16  3:47 PM. Always use your most recent med list.          albuterol 108 (90 Base) MCG/ACT inhaler Commonly known as:  PROVENTIL HFA;VENTOLIN HFA Inhale 2 puffs into the lungs every 6 (six) hours as needed for wheezing or shortness of breath.   aspirin EC 81 MG tablet Take 1 tablet (81 mg total) by mouth daily.   folic acid 1 MG tablet Commonly known as:  FOLVITE Take 1 tablet (1 mg total) by mouth daily.   furosemide 40 MG tablet Commonly known as:  LASIX Take 1 tablet (40 mg total) by mouth daily.   levocetirizine 5 MG tablet Commonly known as:  XYZAL Take 1 tablet (5 mg total) by mouth every evening.   mometasone 50 MCG/ACT nasal spray Commonly known as:  NASONEX Place 2 sprays into the nose at bedtime.   montelukast 10 MG tablet Commonly known as:  SINGULAIR Take 1 tablet (10 mg total) by mouth at bedtime.   MULTIPLE VITAMIN PO Take 1 tablet by mouth daily.   omeprazole 40 MG capsule Commonly known as:  PRILOSEC Take 1 capsule (40 mg total) by mouth  daily.   promethazine-dextromethorphan 6.25-15 MG/5ML syrup Commonly known as:  PROMETHAZINE-DM Take 5 mLs by mouth 4 (four) times daily as needed.       Allergies: No Known Allergies  Past Medical History, Surgical history, Social history, and Family History were reviewed and updated.  Review of Systems: All other 10 point review of systems is negative.   Physical Exam:  weight is 250 lb 12.8 oz (113.8 kg). Laurie Monroe oral temperature is 98.8 F (37.1 C). Laurie Monroe blood pressure is 124/69 and Laurie Monroe pulse is 90. Laurie Monroe respiration is 20.   Wt Readings from Last 3 Encounters:  07/05/16 250 lb 12.8 oz (113.8 kg)  05/17/16 247 lb 6.4 oz (112.2 kg)  03/29/16 249 lb (112.9 kg)    Ocular: Sclerae unicteric, pupils equal, round and reactive to light Ear-nose-throat: Oropharynx clear, dentition fair Lymphatic: No cervical supraclavicular or axillary adenopathy Lungs no rales or rhonchi, good excursion bilaterally Heart regular rate and rhythm, no murmur appreciated Abd soft, nontender, positive bowel sounds, no liver or spleen tip palpated on exam  MSK no focal spinal tenderness, no joint edema Neuro: non-focal, well-oriented, appropriate affect Breasts: Deferred   Lab Results  Component Value Date   WBC 19.0 (H) 03/29/2016   HGB 9.4 (L) 03/29/2016   HCT 27.7 (L) 03/29/2016  MCV 59.2 (L) 03/29/2016   PLT 273 03/29/2016   Lab Results  Component Value Date   FERRITIN 20 12/12/2014   Lab Results  Component Value Date   RBC 4.68 03/29/2016   No results found for: KPAFRELGTCHN, LAMBDASER, KAPLAMBRATIO No results found for: IGGSERUM, IGA, IGMSERUM No results found for: Odetta Pink, SPEI   Chemistry      Component Value Date/Time   NA 136 03/16/2016 1549   K 4.0 03/16/2016 1549   CL 105 03/16/2016 1549   CO2 24 03/16/2016 1549   BUN 13 03/16/2016 1549   CREATININE 0.77 03/16/2016 1549   CREATININE 0.71 04/18/2015 1102        Component Value Date/Time   CALCIUM 9.0 03/16/2016 1549   ALKPHOS 66 03/16/2016 1549   AST 16 03/16/2016 1549   ALT 13 (L) 03/16/2016 1549   BILITOT 0.6 03/16/2016 1549     Impression and Plan: Laurie Monroe is a very charming 45 yo African American female with mild leukocytosis, Hgb C trait and Alpha thalassemia trait. WBC count is 14.3 and she has had no issue with infections.  Laurie Monroe Hgb is stable at 11.8 but MCV is still quite low at 58. She will start taking Folic acid 2 mg PO daily.  We will see what Laurie Monroe iron studies show and bring Laurie Monroe back in next week for an infusion if needed.  We will plan to see Laurie Monroe back in 6 months for repeat lab work and follow-up.  She will contact our office with any questions or concerns. We can certainly see Laurie Monroe sooner if need be.    Eliezer Bottom, NP 11/2/20173:47 PM

## 2016-07-06 LAB — IRON AND TIBC
%SAT: 10 % — AB (ref 21–57)
Iron: 37 ug/dL — ABNORMAL LOW (ref 41–142)
TIBC: 370 ug/dL (ref 236–444)
UIBC: 333 ug/dL (ref 120–384)

## 2016-07-06 LAB — RETICULOCYTES: Reticulocyte Count: 1.1 % (ref 0.6–2.6)

## 2016-07-06 LAB — FERRITIN: Ferritin: 22 ng/ml (ref 9–269)

## 2016-07-10 ENCOUNTER — Telehealth: Payer: Self-pay | Admitting: *Deleted

## 2016-07-10 ENCOUNTER — Encounter: Payer: Self-pay | Admitting: Hematology & Oncology

## 2016-07-10 ENCOUNTER — Telehealth: Payer: Self-pay

## 2016-07-10 NOTE — Telephone Encounter (Addendum)
 -----   Message from Eliezer Bottom, NP sent at 07/06/2016  3:27 PM EDT ----- Regarding: Iron  Iron studies low. Needs one dose of Feraheme next week please with follow-up visit with lab in 6 weeks. Thank you!  Sarah  ----- Message ----- From: Volanda Napoleon, MD Sent: 07/06/2016  11:23 AM To: Eliezer Bottom, NP    ----- Message ----- From: Interface, Lab In Three Zero One Sent: 07/05/2016   3:48 PM To: Volanda Napoleon, MD

## 2016-07-10 NOTE — Telephone Encounter (Addendum)
-----   Message from Eliezer Bottom, NP sent at 07/06/2016  3:27 PM EDT ----- Regarding: Iron  Iron studies low. Needs one dose of Feraheme next week please with follow-up visit with lab in 6 weeks. Thank you!  Sarah  Pt returned call from this am. Above message given to pt who verbalizes understanding. Transferred to scheduler. dph

## 2016-07-13 ENCOUNTER — Ambulatory Visit (HOSPITAL_BASED_OUTPATIENT_CLINIC_OR_DEPARTMENT_OTHER): Payer: PRIVATE HEALTH INSURANCE

## 2016-07-13 ENCOUNTER — Other Ambulatory Visit: Payer: Self-pay | Admitting: Family

## 2016-07-13 VITALS — BP 111/65 | HR 80 | Temp 98.4°F | Resp 20

## 2016-07-13 DIAGNOSIS — D509 Iron deficiency anemia, unspecified: Secondary | ICD-10-CM | POA: Insufficient documentation

## 2016-07-13 DIAGNOSIS — D500 Iron deficiency anemia secondary to blood loss (chronic): Secondary | ICD-10-CM

## 2016-07-13 MED ORDER — SODIUM CHLORIDE 0.9 % IV SOLN
Freq: Once | INTRAVENOUS | Status: AC
  Administered 2016-07-13: 15:00:00 via INTRAVENOUS

## 2016-07-13 MED ORDER — SODIUM CHLORIDE 0.9 % IV SOLN
510.0000 mg | Freq: Once | INTRAVENOUS | Status: AC
  Administered 2016-07-13: 510 mg via INTRAVENOUS
  Filled 2016-07-13: qty 17

## 2016-07-13 NOTE — Patient Instructions (Signed)

## 2016-08-09 ENCOUNTER — Ambulatory Visit (INDEPENDENT_AMBULATORY_CARE_PROVIDER_SITE_OTHER): Payer: PRIVATE HEALTH INSURANCE | Admitting: Physician Assistant

## 2016-08-09 VITALS — BP 116/84 | HR 81 | Temp 97.6°F | Resp 17 | Ht 66.0 in | Wt 250.0 lb

## 2016-08-09 DIAGNOSIS — J069 Acute upper respiratory infection, unspecified: Secondary | ICD-10-CM

## 2016-08-09 DIAGNOSIS — R059 Cough, unspecified: Secondary | ICD-10-CM

## 2016-08-09 DIAGNOSIS — R0981 Nasal congestion: Secondary | ICD-10-CM

## 2016-08-09 DIAGNOSIS — H6591 Unspecified nonsuppurative otitis media, right ear: Secondary | ICD-10-CM

## 2016-08-09 DIAGNOSIS — R05 Cough: Secondary | ICD-10-CM

## 2016-08-09 LAB — POCT INFLUENZA A/B
Influenza A, POC: NEGATIVE
Influenza B, POC: NEGATIVE

## 2016-08-09 MED ORDER — MUCINEX DM MAXIMUM STRENGTH 60-1200 MG PO TB12: 1.0000 | ORAL_TABLET | Freq: Two times a day (BID) | ORAL | 1 refills | Status: DC

## 2016-08-09 MED ORDER — PROMETHAZINE-DM 6.25-15 MG/5ML PO SYRP: 5.0000 mL | ORAL_SOLUTION | Freq: Four times a day (QID) | ORAL | 0 refills | Status: DC | PRN

## 2016-08-09 MED ORDER — AMOXICILLIN 500 MG PO CAPS: 500.0000 mg | ORAL_CAPSULE | Freq: Three times a day (TID) | ORAL | 0 refills | Status: DC

## 2016-08-09 MED ORDER — MOMETASONE FUROATE 50 MCG/ACT NA SUSP: 2.0000 | Freq: Every day | NASAL | 1 refills | Status: DC

## 2016-08-09 NOTE — Patient Instructions (Addendum)
Please drink plenty of fluids and rest. If you are not better by Sunday or Monday start the antibiotic.   Thank you for coming in today. I hope you feel we met your needs.  Feel free to call [facility] if you have any questions or further requests.  Please consider signing up for MyChart if you do not already have it, as this is a great way to communicate with me.  Best,  Whitney McVey, PA-C   IF you received an x-ray today, you will receive an invoice from [facility]. Please contact [facility] at [phone] with questions or concerns regarding your invoice.   IF you received labwork today, you will receive an invoice from [employer]. Please contact [facility] at [phone] with questions or concerns regarding your invoice.   Our billing staff will not be able to assist you with questions regarding bills from these companies.  You will be contacted with the lab results as soon as they are available. The fastest way to get your results is to activate your My Chart account. Instructions are located on the last page of this paperwork. If you have not heard from [place] regarding the results in 2 weeks, please contact this office.

## 2016-08-09 NOTE — Progress Notes (Signed)
Laurie Monroe  MRN: [id number] DOB: 1971/08/21  PCP: [name], DO  Subjective:  Pt is a 45 year old female PMH HTN, iron deficiency anemia, GERD, who presents to clinic for sore throat and cough x six days. Her symptoms started as a tickle in her throat and progressed to non-productive cough, nasal congestion, voice change and runny nose. She says overall she is feeling a little better, however her symptoms have not resolved and are getting worse.  She is worried that she is contagious as she works at a [employer] facility and does not want to get anyone sick..  She has had her flu shot this year.  She has been using Nasonex, promethazine DM - helps some but she is not better.   Denies chest pain, palpitations, headache, SOB, wheezing, lightheadedness,.   Review of Systems  Constitutional: Positive for chills. Negative for diaphoresis, fatigue and fever.  HENT: Positive for congestion, rhinorrhea, sore throat and voice change. Negative for postnasal drip, sinus pressure and sneezing.   Respiratory: Positive for cough. Negative for chest tightness, shortness of breath and wheezing.   Cardiovascular: Negative for chest pain and palpitations.  Gastrointestinal: Negative for abdominal pain, diarrhea, nausea and vomiting.  Neurological: Negative for weakness, light-headedness and headaches.  Psychiatric/Behavioral: Negative for sleep disturbance.    Patient Active Problem List   Diagnosis Date Noted  . Iron deficiency anemia 07/13/2016  . Hemoglobin C trait ([place]) 07/05/2016  . S/P abdominal hysterectomy 03/28/2016  . Leukocytosis 12/28/2015  . Thalassemia trait, alpha 12/28/2015  . HTN (hypertension) 11/07/2015  . Elevated BP 10/10/2015  . GERD (gastroesophageal reflux disease) 10/10/2015  . Chest pain 10/10/2015  . Edema 10/10/2015  . Lower extremity edema 10/10/2015  . Chronic venous insufficiency 04/18/2015  . Onychomycosis 09/01/2013  . Pain in toe 09/01/2013    . Seizures ([place]) 07/08/2012    Current Outpatient Prescriptions on File Prior to Visit  Medication Sig Dispense Refill  . albuterol (PROVENTIL HFA;VENTOLIN HFA) 108 (90 BASE) MCG/ACT inhaler Inhale 2 puffs into the lungs every 6 (six) hours as needed for wheezing or shortness of breath. 1 Inhaler 0  . aspirin EC 81 MG tablet Take 1 tablet (81 mg total) by mouth daily. [id number] tablet 3  . folic acid (FOLVITE) 1 MG tablet Take 1 tablet (1 mg total) by mouth daily. 90 tablet 3  . furosemide (LASIX) 40 MG tablet Take 1 tablet (40 mg total) by mouth daily. 30 tablet 3  . levocetirizine (XYZAL) 5 MG tablet Take 1 tablet (5 mg total) by mouth every evening. 30 tablet 5  . mometasone (NASONEX) 50 MCG/ACT nasal spray Place 2 sprays into the nose at bedtime. (Patient taking differently: Place 2 sprays into the nose daily as needed. ) 17 g 1  . montelukast (SINGULAIR) 10 MG tablet Take 1 tablet (10 mg total) by mouth at bedtime. 30 tablet 11  . MULTIPLE VITAMIN PO Take 1 tablet by mouth daily.     . promethazine-dextromethorphan (PROMETHAZINE-DM) 6.25-15 MG/5ML syrup Take 5 mLs by mouth 4 (four) times daily as needed. 118 mL 0   No current facility-administered medications on file prior to visit.     No Known Allergies   Objective:  BP 116/84 (BP Location: Right Arm, Patient Position: Sitting, Cuff Size: Large)   Pulse 81   Temp 97.6 F (36.4 C) (Oral)   Resp 17   Ht 5\' 6"  (1.676 m)   Wt 250 lb (113.4 kg)  LMP 03/28/2016 (Exact Date)   SpO2 99%   BMI 40.35 kg/m   Physical Exam  Constitutional: She is oriented to person, place, and time and well-developed, well-nourished, and in no distress. No distress.  HENT:  Right Ear: Tympanic membrane is bulging.  Left Ear: Tympanic membrane normal.  Mouth/Throat: Posterior oropharyngeal edema present. No oropharyngeal exudate or posterior oropharyngeal erythema.  Cardiovascular: Normal rate, regular rhythm and normal heart sounds.   Pulmonary/Chest:  Effort normal. She has no wheezes. She has no rhonchi. She has no rales.  Neurological: She is alert and oriented to person, place, and time. GCS score is 15.  Skin: Skin is warm and dry.  Psychiatric: Mood, memory, affect and judgment normal.  Vitals reviewed.  Results for orders placed or performed in visit on 08/09/16  POCT Influenza A/B  Result Value Ref Range   Influenza A, POC Negative Negative   Influenza B, POC Negative Negative    Assessment and Plan :  1. Nasal congestion 2. Cough 3. Acute upper respiratory infection - POCT Influenza A/B - promethazine-dextromethorphan (PROMETHAZINE-DM) 6.25-15 MG/5ML syrup; Take 5 mLs by mouth 4 (four) times daily as needed.  Dispense: 118 mL; Refill: 0 - mometasone (NASONEX) 50 MCG/ACT nasal spray; Place 2 sprays into the nose at bedtime.  Dispense: 17 g; Refill: 1 - Dextromethorphan-Guaifenesin (MUCINEX DM MAXIMUM STRENGTH) 60-1200 MG TB12; Take 1 tablet by mouth every 12 (twelve) hours.  Dispense: 14 each; Refill: 1 - Supportive care: Drink plenty of fluids and rest. RTC in 5-7 days if no improvement.   4. Right non-suppurative otitis media - amoxicillin (AMOXIL) 500 MG capsule; Take 1 capsule (500 mg total) by mouth 3 (three) times daily.  Dispense: 30 capsule; Refill: 0  Mercer Pod, PA-C  [facility] 08/09/2016 9:57 AM

## 2016-08-23 ENCOUNTER — Other Ambulatory Visit: Payer: Self-pay | Admitting: *Deleted

## 2016-08-24 ENCOUNTER — Ambulatory Visit (HOSPITAL_BASED_OUTPATIENT_CLINIC_OR_DEPARTMENT_OTHER): Payer: PRIVATE HEALTH INSURANCE | Admitting: Family

## 2016-08-24 ENCOUNTER — Other Ambulatory Visit (HOSPITAL_BASED_OUTPATIENT_CLINIC_OR_DEPARTMENT_OTHER): Payer: PRIVATE HEALTH INSURANCE

## 2016-08-24 VITALS — BP 118/78 | HR 77 | Temp 98.6°F | Resp 16 | Wt 252.0 lb

## 2016-08-24 DIAGNOSIS — D563 Thalassemia minor: Secondary | ICD-10-CM

## 2016-08-24 DIAGNOSIS — D509 Iron deficiency anemia, unspecified: Secondary | ICD-10-CM | POA: Diagnosis not present

## 2016-08-24 DIAGNOSIS — D582 Other hemoglobinopathies: Secondary | ICD-10-CM

## 2016-08-24 LAB — CBC WITH DIFFERENTIAL (CANCER CENTER ONLY)
BASO#: 0.1 10*3/uL (ref 0.0–0.2)
BASO%: 0.5 % (ref 0.0–2.0)
EOS%: 2.3 % (ref 0.0–7.0)
Eosinophils Absolute: 0.3 10*3/uL (ref 0.0–0.5)
HEMATOCRIT: 34.9 % (ref 34.8–46.6)
HGB: 12.1 g/dL (ref 11.6–15.9)
LYMPH#: 3.3 10*3/uL (ref 0.9–3.3)
LYMPH%: 25.3 % (ref 14.0–48.0)
MCH: 20.8 pg — ABNORMAL LOW (ref 26.0–34.0)
MCHC: 34.7 g/dL (ref 32.0–36.0)
MCV: 60 fL — AB (ref 81–101)
MONO#: 1 10*3/uL — AB (ref 0.1–0.9)
MONO%: 7.3 % (ref 0.0–13.0)
NEUT#: 8.4 10*3/uL — ABNORMAL HIGH (ref 1.5–6.5)
NEUT%: 64.6 % (ref 39.6–80.0)
Platelets: 369 10*3/uL (ref 145–400)
RBC: 5.83 10*6/uL — AB (ref 3.70–5.32)
RDW: 18.7 % — AB (ref 11.1–15.7)
WBC: 13 10*3/uL — ABNORMAL HIGH (ref 3.9–10.0)

## 2016-08-24 LAB — CHCC SATELLITE - SMEAR

## 2016-08-24 MED ORDER — FOLIC ACID 1 MG PO TABS: 2.0000 mg | ORAL_TABLET | Freq: Every day | ORAL | 3 refills | Status: DC

## 2016-08-24 NOTE — Progress Notes (Signed)
Hematology and Oncology Follow Up Visit  BETHINE HERMOSO [id number] [birth year] 45 y.o. 08/24/2016   Principle Diagnosis:  Hgb C trait Alpha thalassemia trait  Current Therapy:   Folic acid 2 mg PO daily    Interim History:  Ms. Solorio is here today for follow-up. She is doing well but still having some intermittent fatigue. She received IV iron in November for an iron saturation of 10%. Hgb is now 12.1 with an MCV of 60. Iron studies are pending. She states that she is taking her folic acid 2 mg PO daily as prescribed.  She has a bit of a head cold and has completed a round of amoxicillin and is using nasonex as needed at bedtime. She states that this is improving.  No fever, chills, n/v, cough, rash, dizziness, SOB, chest pain, palpitations, abdominal pain or changes in bowel or bladder habits.  No swelling, tenderness, numbness or tingling in her extremities. No new aches or pains.  She has maintained a good appetite and is staying well hydrated. Her weight is stable.   Medications:  Allergies as of 08/24/2016   No Known Allergies     Medication List       Accurate as of 08/24/16  3:29 PM. Always use your most recent med list.          albuterol 108 (90 Base) MCG/ACT inhaler Commonly known as:  PROVENTIL HFA;VENTOLIN HFA Inhale 2 puffs into the lungs every 6 (six) hours as needed for wheezing or shortness of breath.   amoxicillin 500 MG capsule Commonly known as:  AMOXIL Take 1 capsule (500 mg total) by mouth 3 (three) times daily.   aspirin EC 81 MG tablet Take 1 tablet (81 mg total) by mouth daily.   folic acid 1 MG tablet Commonly known as:  FOLVITE Take 1 tablet (1 mg total) by mouth daily.   furosemide 40 MG tablet Commonly known as:  LASIX Take 1 tablet (40 mg total) by mouth daily.   levocetirizine 5 MG tablet Commonly known as:  XYZAL Take 1 tablet (5 mg total) by mouth every evening.   mometasone 50 MCG/ACT nasal spray Commonly known as:   NASONEX Place 2 sprays into the nose at bedtime.   montelukast 10 MG tablet Commonly known as:  SINGULAIR Take 1 tablet (10 mg total) by mouth at bedtime.   MUCINEX DM MAXIMUM STRENGTH 60-1200 MG Tb12 Take 1 tablet by mouth every 12 (twelve) hours.   MULTIPLE VITAMIN PO Take 1 tablet by mouth daily.   promethazine-dextromethorphan 6.25-15 MG/5ML syrup Commonly known as:  PROMETHAZINE-DM Take 5 mLs by mouth 4 (four) times daily as needed.       Allergies: No Known Allergies  Past Medical History, Surgical history, Social history, and Family History were reviewed and updated.  Review of Systems: All other 10 point review of systems is negative.   Physical Exam:  vitals were not taken for this visit.  Wt Readings from Last 3 Encounters:  08/09/16 250 lb (113.4 kg)  07/05/16 250 lb 12.8 oz (113.8 kg)  05/17/16 247 lb 6.4 oz (112.2 kg)    Ocular: Sclerae unicteric, pupils equal, round and reactive to light Ear-nose-throat: Oropharynx clear, dentition fair Lymphatic: No cervical supraclavicular or axillary adenopathy Lungs no rales or rhonchi, good excursion bilaterally Heart regular rate and rhythm, no murmur appreciated Abd soft, nontender, positive bowel sounds, no liver or spleen tip palpated on exam  MSK no focal spinal tenderness, no joint edema Neuro: non-focal,  well-oriented, appropriate affect Breasts: Deferred   Lab Results  Component Value Date   WBC 14.3 (H) 07/05/2016   HGB 11.8 07/05/2016   HCT 34.0 (L) 07/05/2016   MCV 58 (L) 07/05/2016   PLT 415 (H) 07/05/2016   Lab Results  Component Value Date   FERRITIN 22 07/05/2016   IRON 37 (L) 07/05/2016   TIBC 370 07/05/2016   UIBC 333 07/05/2016   IRONPCTSAT 10 (L) 07/05/2016   Lab Results  Component Value Date   RBC 5.89 (H) 07/05/2016   No results found for: KPAFRELGTCHN, LAMBDASER, KAPLAMBRATIO No results found for: IGGSERUM, IGA, IGMSERUM No results found for: Odetta Pink, SPEI   Chemistry      Component Value Date/Time   NA 136 03/16/2016 1549   K 4.0 03/16/2016 1549   CL 105 03/16/2016 1549   CO2 24 03/16/2016 1549   BUN 13 03/16/2016 1549   CREATININE 0.77 03/16/2016 1549   CREATININE 0.71 04/18/2015 1102      Component Value Date/Time   CALCIUM 9.0 03/16/2016 1549   ALKPHOS 66 03/16/2016 1549   AST 16 03/16/2016 1549   ALT 13 (L) 03/16/2016 1549   BILITOT 0.6 03/16/2016 1549     Impression and Plan: Ms. Lobue is a very charming 45 yo African American female with mild leukocytosis, Hgb C trait and Alpha thalassemia trait. She is taking her folic acid 2 mg PO daily as prescribed. This was refilled today.  She received IV iron in November and has noticed an improvement in her symptoms. She is still having some mild fatigue at times.  We will see what her iron studies show and bring her back in next week for an infusion if needed.  We will plan to see her back in 2 months for repeat lab work and follow-up.  She will contact our office with any questions or concerns. We can certainly see her sooner if need be.    Eliezer Bottom, NP 12/22/20173:29 PM

## 2016-08-25 LAB — RETICULOCYTES: Reticulocyte Count: 1 % (ref 0.6–2.6)

## 2016-08-28 LAB — IRON AND TIBC
%SAT: 14 % — AB (ref 21–57)
IRON: 42 ug/dL (ref 41–142)
TIBC: 295 ug/dL (ref 236–444)
UIBC: 253 ug/dL (ref 120–384)

## 2016-08-28 LAB — FERRITIN: FERRITIN: 94 ng/mL (ref 9–269)

## 2016-08-29 ENCOUNTER — Telehealth: Payer: Self-pay | Admitting: *Deleted

## 2016-08-29 NOTE — Telephone Encounter (Signed)
-----   Message from Eliezer Bottom, NP sent at 08/28/2016 11:24 AM EST ----- Regarding: Iron  Iron saturation still low. She will need one dose of IV iron this week please. Thank you!  Sarah ----- Message ----- From: Interface, Lab In Three Zero One Sent: 08/24/2016   3:32 PM To: Eliezer Bottom, NP

## 2016-08-31 ENCOUNTER — Telehealth: Payer: Self-pay | Admitting: *Deleted

## 2016-08-31 NOTE — Telephone Encounter (Addendum)
Patient aware. Appointment made  ----- Message from Eliezer Bottom, NP sent at 08/28/2016 11:24 AM EST ----- Regarding: Iron  Iron saturation still low. She will need one dose of IV iron this week please. Thank you!  Sarah ----- Message ----- From: Interface, Lab In Three Zero One Sent: 08/24/2016   3:32 PM To: Eliezer Bottom, NP

## 2016-09-03 ENCOUNTER — Other Ambulatory Visit: Payer: Self-pay | Admitting: Physician Assistant

## 2016-09-03 DIAGNOSIS — J069 Acute upper respiratory infection, unspecified: Secondary | ICD-10-CM

## 2016-09-03 NOTE — Telephone Encounter (Signed)
SS refill req - promethazine-dextromethorphan To [facility]

## 2016-09-05 NOTE — Telephone Encounter (Signed)
OK to refill at this time. If her cough continues, she should come back in for eval. Thank you.

## 2016-09-07 ENCOUNTER — Ambulatory Visit (HOSPITAL_BASED_OUTPATIENT_CLINIC_OR_DEPARTMENT_OTHER): Payer: PRIVATE HEALTH INSURANCE

## 2016-09-07 VITALS — BP 115/66 | HR 87 | Temp 98.4°F

## 2016-09-07 DIAGNOSIS — D509 Iron deficiency anemia, unspecified: Secondary | ICD-10-CM

## 2016-09-07 DIAGNOSIS — D500 Iron deficiency anemia secondary to blood loss (chronic): Secondary | ICD-10-CM

## 2016-09-07 MED ORDER — SODIUM CHLORIDE 0.9 % IV SOLN
Freq: Once | INTRAVENOUS | Status: AC
  Administered 2016-09-07: 16:00:00 via INTRAVENOUS

## 2016-09-07 MED ORDER — SODIUM CHLORIDE 0.9 % IV SOLN
510.0000 mg | Freq: Once | INTRAVENOUS | Status: AC
  Administered 2016-09-07: 510 mg via INTRAVENOUS
  Filled 2016-09-07: qty 17

## 2016-09-07 NOTE — Patient Instructions (Signed)

## 2016-10-12 ENCOUNTER — Other Ambulatory Visit: Payer: Self-pay | Admitting: Family Medicine

## 2016-10-15 NOTE — Telephone Encounter (Signed)
Aspirin refill sent to pharmacy. Pt last seen by PCP 05/2016 and due for follow up.  Please call pt to schedule appointment soon. Thanks!

## 2016-10-18 NOTE — Telephone Encounter (Signed)
Spoke with patient and informed her to schedule a follow up appointment. She states she will call to schedule when she is back in town.

## 2016-10-23 ENCOUNTER — Ambulatory Visit (INDEPENDENT_AMBULATORY_CARE_PROVIDER_SITE_OTHER): Payer: PRIVATE HEALTH INSURANCE | Admitting: Family Medicine

## 2016-10-23 ENCOUNTER — Encounter: Payer: Self-pay | Admitting: Family Medicine

## 2016-10-23 VITALS — BP 126/72 | HR 93 | Temp 98.4°F | Resp 16 | Ht 66.0 in | Wt 256.8 lb

## 2016-10-23 DIAGNOSIS — R011 Cardiac murmur, unspecified: Secondary | ICD-10-CM

## 2016-10-23 DIAGNOSIS — Z889 Allergy status to unspecified drugs, medicaments and biological substances status: Secondary | ICD-10-CM

## 2016-10-23 DIAGNOSIS — I10 Essential (primary) hypertension: Secondary | ICD-10-CM

## 2016-10-23 MED ORDER — LEVOCETIRIZINE DIHYDROCHLORIDE 5 MG PO TABS: 5.0000 mg | ORAL_TABLET | Freq: Every evening | ORAL | 3 refills | Status: DC

## 2016-10-23 NOTE — Progress Notes (Signed)
Subjective:    Patient ID: Laurie Monroe, female    DOB: Dec 05, 1970, 46 y.o.   MRN: [id number]  Chief Complaint  Patient presents with  . Hypertension  . Edema    HPI Patient is in today for follow up blood pressure and edema.  No complaints.    Past Medical History:  Diagnosis Date  . Allergy   . Anemia   . Chicken pox   . Epilepsy ([place])    Hx of Epilepsy. No seizures in over 20 years  . GERD (gastroesophageal reflux disease)   . Leukocytosis 12/28/2015  . Seizures ([place])    last episode 20 years ago  . Thalassemia trait, alpha 12/28/2015    Past Surgical History:  Procedure Laterality Date  . ABDOMINAL HYSTERECTOMY N/A 03/28/2016   Procedure: HYSTERECTOMY ABDOMINAL;  Surgeon: Cheri Fowler, MD;  Location: [facility];  Service: Gynecology;  Laterality: N/A;  . BILATERAL SALPINGECTOMY Bilateral 03/28/2016   Procedure: BILATERAL SALPINGECTOMY;  Surgeon: Cheri Fowler, MD;  Location: [facility];  Service: Gynecology;  Laterality: Bilateral;  . DILATION AND CURETTAGE OF UTERUS     Heavy Periods  . VARICOSE VEIN SURGERY      Family History  Problem Relation Age of Onset  . Hypertension Mother   . Prostate cancer Father   . Hypertension Father   . Liver cancer Father   . Stomach cancer Father   . Hypertension Brother     Twin Brother    Social History   Social History  . Marital status: Single    Spouse name: N/A  . Number of children: N/A  . 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 Smoking status: Never Smoker  . Smokeless tobacco: Never Used  . Alcohol use No  . Drug use: No  . Sexual activity: Yes    Birth control/ protection: None   Other Topics Concern  . Not on file   Social History Narrative  . No narrative on file    Outpatient Medications Prior to Visit  Medication Sig Dispense Refill  . albuterol (PROVENTIL HFA;VENTOLIN HFA) 108 (90 BASE) MCG/ACT inhaler Inhale 2 puffs into the lungs  every 6 (six) hours as needed for wheezing or shortness of breath. 1 Inhaler 0  . ASPIRIN LOW DOSE 81 MG EC tablet TAKE 1 TABLET(81 MG) BY MOUTH DAILY 123XX123 tablet 1  . folic acid (FOLVITE) 1 MG tablet Take 2 tablets (2 mg total) by mouth daily. 180 tablet 3  . furosemide (LASIX) 40 MG tablet Take 1 tablet (40 mg total) by mouth daily. 30 tablet 3  . mometasone (NASONEX) 50 MCG/ACT nasal spray Place 2 sprays into the nose at bedtime. 17 g 1  . montelukast (SINGULAIR) 10 MG tablet Take 1 tablet (10 mg total) by mouth at bedtime. 30 tablet 11  . MULTIPLE VITAMIN PO Take 1 tablet by mouth daily.     Marland Kitchen levocetirizine (XYZAL) 5 MG tablet Take 1 tablet (5 mg total) by mouth every evening. 30 tablet 5  . amoxicillin (AMOXIL) 500 MG capsule Take 1 capsule (500 mg total) by mouth 3 (three) times daily. 30 capsule 0  . Dextromethorphan-Guaifenesin (MUCINEX DM MAXIMUM STRENGTH) 60-1200 MG TB12 Take 1 tablet by mouth every 12 (twelve) hours. 14 each 1  . promethazine-dextromethorphan (PROMETHAZINE-DM) 6.25-15 MG/5ML syrup TAKE 5 ML BY MOUTH FOUR TIMES DAILY AS NEEDED 118 mL 0   No facility-administered medications prior  to visit.     No Known Allergies  Review of Systems  Constitutional: Negative for fever and malaise/fatigue.  HENT: Negative for congestion.   Eyes: Negative for blurred vision.  Respiratory: Negative for cough and shortness of breath.   Cardiovascular: Negative for chest pain, palpitations and leg swelling.  Gastrointestinal: Negative for vomiting.  Musculoskeletal: Negative for back pain.  Skin: Negative for rash.  Neurological: Negative for loss of consciousness and headaches.       Objective:    Physical Exam  Constitutional: She is oriented to person, place, and time. She appears well-developed and well-nourished. No distress.  HENT:  Head: Normocephalic and atraumatic.  Eyes: Conjunctivae are normal.  Neck: Normal range of motion. No thyromegaly present.  Cardiovascular:  Normal rate and regular rhythm.   Murmur heard. Pulmonary/Chest: Effort normal and breath sounds normal. She has no wheezes.  Abdominal: Soft. Bowel sounds are normal. There is no tenderness.  Musculoskeletal: Normal range of motion. She exhibits no edema or deformity.  Neurological: She is alert and oriented to person, place, and time.  Skin: Skin is warm and dry. She is not diaphoretic.  Psychiatric: She has a normal mood and affect. Her behavior is normal. Judgment and thought content normal.    BP 126/72 (BP Location: Left Arm, Cuff Size: Large)   Pulse 93   Temp 98.4 F (36.9 C) (Oral)   Resp 16   Ht 5\' 6"  (1.676 m)   Wt 256 lb 12.8 oz (116.5 kg)   LMP 03/28/2016 (Exact Date)   SpO2 99%   BMI 41.45 kg/m  Wt Readings from Last 3 Encounters:  10/23/16 256 lb 12.8 oz (116.5 kg)  08/24/16 252 lb (114.3 kg)  08/09/16 250 lb (113.4 kg)     Lab Results  Component Value Date   WBC 13.0 (H) 08/24/2016   HGB 12.1 08/24/2016   HCT 34.9 08/24/2016   PLT 369 08/24/2016   GLUCOSE 89 03/16/2016   CHOL 141 11/21/2015   TRIG 53.0 11/21/2015   HDL 51.90 11/21/2015   LDLCALC 79 11/21/2015   ALT 13 (L) 03/16/2016   AST 16 03/16/2016   NA 136 03/16/2016   K 4.0 03/16/2016   CL 105 03/16/2016   CREATININE 0.77 03/16/2016   BUN 13 03/16/2016   CO2 24 03/16/2016   TSH 1.031 04/18/2015   HGBA1C 5.2 10/07/2014   MICROALBUR 0.7 11/21/2015    Lab Results  Component Value Date   TSH 1.031 04/18/2015   Lab Results  Component Value Date   WBC 13.0 (H) 08/24/2016   HGB 12.1 08/24/2016   HCT 34.9 08/24/2016   MCV 60 (L) 08/24/2016   PLT 369 08/24/2016   Lab Results  Component Value Date   NA 136 03/16/2016   K 4.0 03/16/2016   CO2 24 03/16/2016   GLUCOSE 89 03/16/2016   BUN 13 03/16/2016   CREATININE 0.77 03/16/2016   BILITOT 0.6 03/16/2016   ALKPHOS 66 03/16/2016   AST 16 03/16/2016   ALT 13 (L) 03/16/2016   PROT 7.0 03/16/2016   ALBUMIN 3.8 03/16/2016   CALCIUM 9.0  03/16/2016   ANIONGAP 7 03/16/2016   GFR 94.01 11/21/2015   Lab Results  Component Value Date   CHOL 141 11/21/2015   Lab Results  Component Value Date   HDL 51.90 11/21/2015   Lab Results  Component Value Date   LDLCALC 79 11/21/2015   Lab Results  Component Value Date   TRIG 53.0 11/21/2015  Lab Results  Component Value Date   CHOLHDL 3 11/21/2015   Lab Results  Component Value Date   HGBA1C 5.2 10/07/2014       Assessment & Plan:   Problem List Items Addressed This Visit      Unprioritized   HTN (hypertension) - Primary   Relevant Orders   CBC with Differential/Platelet   Comprehensive metabolic panel   Lipid panel    Other Visit Diagnoses    History of seasonal allergies       Relevant Medications   levocetirizine (XYZAL) 5 MG tablet   Murmur, cardiac       Relevant Orders   ECHOCARDIOGRAM COMPLETE   CBC with Differential/Platelet   Comprehensive metabolic panel   Lipid panel      I have discontinued Ms. Tonelli's amoxicillin, MUCINEX DM MAXIMUM STRENGTH, and promethazine-dextromethorphan. I am also having her maintain her albuterol, MULTIPLE VITAMIN PO, montelukast, furosemide, mometasone, folic acid, ASPIRIN LOW DOSE, and levocetirizine.  Meds ordered this encounter  Medications  . levocetirizine (XYZAL) 5 MG tablet    Sig: Take 1 tablet (5 mg total) by mouth every evening.    Dispense:  90 tablet    Refill:  3    CMA served as scribe during this visit. History, Physical and Plan performed by medical provider. Documentation and orders reviewed and attested to.  [name], DO

## 2016-10-23 NOTE — Progress Notes (Signed)
Pre visit review using our clinic review tool, if applicable. No additional management support is needed unless otherwise documented below in the visit note. 

## 2016-10-23 NOTE — Patient Instructions (Signed)
Hypertension Hypertension, commonly called high blood pressure, is when the force of blood pumping through your arteries is too strong. Your arteries are the blood vessels that carry blood from your heart throughout your body. A blood pressure reading consists of a higher number over a lower number, such as 110/72. The higher number (systolic) is the pressure inside your arteries when your heart pumps. The lower number (diastolic) is the pressure inside your arteries when your heart relaxes. Ideally you want your blood pressure below 120/80. Hypertension forces your heart to work harder to pump blood. Your arteries may become narrow or stiff. Having untreated or uncontrolled hypertension can cause heart attack, stroke, kidney disease, and other problems. What increases the risk? Some risk factors for high blood pressure are controllable. Others are not. Risk factors you cannot control include:  Race. You may be at higher risk if you are African American.  Age. Risk increases with age.  Gender. Men are at higher risk than women before age 45 years. After age 65, women are at higher risk than men. Risk factors you can control include:  Not getting enough exercise or physical activity.  Being overweight.  Getting too much fat, sugar, calories, or salt in your diet.  Drinking too much alcohol. What are the signs or symptoms? Hypertension does not usually cause signs or symptoms. Extremely high blood pressure (hypertensive crisis) may cause headache, anxiety, shortness of breath, and nosebleed. How is this diagnosed? To check if you have hypertension, your health care provider will measure your blood pressure while you are seated, with your arm held at the level of your heart. It should be measured at least twice using the same arm. Certain conditions can cause a difference in blood pressure between your right and left arms. A blood pressure reading that is higher than normal on one occasion does  not mean that you need treatment. If it is not clear whether you have high blood pressure, you may be asked to return on a different day to have your blood pressure checked again. Or, you may be asked to monitor your blood pressure at home for 1 or more weeks. How is this treated? Treating high blood pressure includes making lifestyle changes and possibly taking medicine. Living a healthy lifestyle can help lower high blood pressure. You may need to change some of your habits. Lifestyle changes may include:  Following the DASH diet. This diet is high in fruits, vegetables, and whole grains. It is low in salt, red meat, and added sugars.  Keep your sodium intake below 2,300 mg per day.  Getting at least 30-45 minutes of aerobic exercise at least 4 times per week.  Losing weight if necessary.  Not smoking.  Limiting alcoholic beverages.  Learning ways to reduce stress. Your health care provider may prescribe medicine if lifestyle changes are not enough to get your blood pressure under control, and if one of the following is true:  You are 18-59 years of age and your systolic blood pressure is above 140.  You are 60 years of age or older, and your systolic blood pressure is above 150.  Your diastolic blood pressure is above 90.  You have diabetes, and your systolic blood pressure is over 140 or your diastolic blood pressure is over 90.  You have kidney disease and your blood pressure is above 140/90.  You have heart disease and your blood pressure is above 140/90. Your personal target blood pressure may vary depending on your medical   conditions, your age, and other factors. Follow these instructions at home:  Have your blood pressure rechecked as directed by your health care provider.  Take medicines only as directed by your health care provider. Follow the directions carefully. Blood pressure medicines must be taken as prescribed. The medicine does not work as well when you skip  doses. Skipping doses also puts you at risk for problems.  Do not smoke.  Monitor your blood pressure at home as directed by your health care provider. Contact a health care provider if:  You think you are having a reaction to medicines taken.  You have recurrent headaches or feel dizzy.  You have swelling in your ankles.  You have trouble with your vision. Get help right away if:  You develop a severe headache or confusion.  You have unusual weakness, numbness, or feel faint.  You have severe chest or abdominal pain.  You vomit repeatedly.  You have trouble breathing. This information is not intended to replace advice given to you by your health care provider. Make sure you discuss any questions you have with your health care provider. Document Released: 08/20/2005 Document Revised: 01/26/2016 Document Reviewed: 06/12/2013 Elsevier Interactive Patient Education  2017 [employer] Inc.  

## 2016-10-24 ENCOUNTER — Encounter: Payer: Self-pay | Admitting: Family Medicine

## 2016-10-24 LAB — CBC WITH DIFFERENTIAL/PLATELET
BASOS PCT: 0.8 % (ref 0.0–3.0)
Basophils Absolute: 0.1 10*3/uL (ref 0.0–0.1)
EOS PCT: 2.1 % (ref 0.0–5.0)
Eosinophils Absolute: 0.3 10*3/uL (ref 0.0–0.7)
HEMATOCRIT: 39.8 % (ref 36.0–46.0)
HEMOGLOBIN: 12.6 g/dL (ref 12.0–15.0)
LYMPHS ABS: 3.6 10*3/uL (ref 0.7–4.0)
Lymphocytes Relative: 26.6 % (ref 12.0–46.0)
MCHC: 31.7 g/dL (ref 30.0–36.0)
MONO ABS: 0.9 10*3/uL (ref 0.1–1.0)
Monocytes Relative: 6.7 % (ref 3.0–12.0)
Neutro Abs: 8.6 10*3/uL — ABNORMAL HIGH (ref 1.4–7.7)
Neutrophils Relative %: 63.8 % (ref 43.0–77.0)
PLATELETS: 347 10*3/uL (ref 150.0–400.0)
RBC: 6.06 Mil/uL — ABNORMAL HIGH (ref 3.87–5.11)
RDW: 15.7 % — AB (ref 11.5–15.5)
WBC: 13.4 10*3/uL — ABNORMAL HIGH (ref 4.0–10.5)

## 2016-10-24 LAB — LIPID PANEL
Cholesterol: 125 mg/dL (ref 0–200)
HDL: 53.6 mg/dL (ref >39.00)
LDL CALC: 62 mg/dL (ref 0–99)
NONHDL: 71.87
Total CHOL/HDL Ratio: 2
Triglycerides: 51 mg/dL (ref 0.0–149.0)
VLDL: 10.2 mg/dL (ref 0.0–40.0)

## 2016-10-24 LAB — COMPREHENSIVE METABOLIC PANEL
ALK PHOS: 77 U/L (ref 39–117)
ALT: 11 U/L (ref 0–35)
AST: 14 U/L (ref 0–37)
Albumin: 4 g/dL (ref 3.5–5.2)
BUN: 9 mg/dL (ref 6–23)
CO2: 31 mEq/L (ref 19–32)
CREATININE: 0.7 mg/dL (ref 0.40–1.20)
Calcium: 9.4 mg/dL (ref 8.4–10.5)
Chloride: 101 mEq/L (ref 96–112)
GFR: 117.11 mL/min (ref >60.00)
GLUCOSE: 74 mg/dL (ref 70–99)
POTASSIUM: 4.1 meq/L (ref 3.5–5.1)
Sodium: 139 mEq/L (ref 135–145)
TOTAL PROTEIN: 7 g/dL (ref 6.0–8.3)
Total Bilirubin: 0.4 mg/dL (ref 0.2–1.2)

## 2016-10-26 ENCOUNTER — Other Ambulatory Visit (HOSPITAL_BASED_OUTPATIENT_CLINIC_OR_DEPARTMENT_OTHER): Payer: PRIVATE HEALTH INSURANCE

## 2016-10-26 ENCOUNTER — Ambulatory Visit (HOSPITAL_BASED_OUTPATIENT_CLINIC_OR_DEPARTMENT_OTHER): Payer: PRIVATE HEALTH INSURANCE | Admitting: Family

## 2016-10-26 VITALS — BP 123/71 | HR 80 | Temp 98.2°F | Resp 17 | Wt 257.0 lb

## 2016-10-26 DIAGNOSIS — D582 Other hemoglobinopathies: Secondary | ICD-10-CM | POA: Diagnosis not present

## 2016-10-26 DIAGNOSIS — D508 Other iron deficiency anemias: Secondary | ICD-10-CM | POA: Diagnosis not present

## 2016-10-26 DIAGNOSIS — D563 Thalassemia minor: Secondary | ICD-10-CM | POA: Diagnosis not present

## 2016-10-26 DIAGNOSIS — D509 Iron deficiency anemia, unspecified: Secondary | ICD-10-CM

## 2016-10-26 LAB — CBC WITH DIFFERENTIAL (CANCER CENTER ONLY)
BASO#: 0 10*3/uL (ref 0.0–0.2)
BASO%: 0.2 % (ref 0.0–2.0)
EOS%: 2.1 % (ref 0.0–7.0)
Eosinophils Absolute: 0.3 10*3/uL (ref 0.0–0.5)
HEMATOCRIT: 35.9 % (ref 34.8–46.6)
HGB: 12.7 g/dL (ref 11.6–15.9)
LYMPH#: 3.6 10*3/uL — AB (ref 0.9–3.3)
LYMPH%: 26.2 % (ref 14.0–48.0)
MCH: 21.9 pg — ABNORMAL LOW (ref 26.0–34.0)
MCHC: 35.4 g/dL (ref 32.0–36.0)
MCV: 62 fL — ABNORMAL LOW (ref 81–101)
MONO#: 0.9 10*3/uL (ref 0.1–0.9)
MONO%: 6.9 % (ref 0.0–13.0)
NEUT#: 8.8 10*3/uL — ABNORMAL HIGH (ref 1.5–6.5)
NEUT%: 64.6 % (ref 39.6–80.0)
PLATELETS: 327 10*3/uL (ref 145–400)
RBC: 5.81 10*6/uL — ABNORMAL HIGH (ref 3.70–5.32)
RDW: 15.6 % (ref 11.1–15.7)
WBC: 13.6 10*3/uL — ABNORMAL HIGH (ref 3.9–10.0)

## 2016-10-26 NOTE — Progress Notes (Signed)
Hematology and Oncology Follow Up Visit  Laurie Monroe [id number] March 25, 1971 46 y.o. 10/26/2016   Principle Diagnosis:  Hgb C trait (sickle cell trait) Alpha thalassemia trait Iron deficiency anemia   Current Therapy:   Folic acid 2 mg PO daily IV iron as indicated - last received in January 2018    Interim History:  Laurie Monroe is here today for follow-up. She is feeling fatigued, SOB with exertion and has had a few episodes of palpitations.  She attributes the SOB to weight and is working to improve her diet and exercise to lose weight. She is staying well hydrated.  She last received IV iron in January. She had a hysterectomy several years ago. No episodes of bleeding, bruising or petechiae. No lymphadenopathy found on exam.  No fever, chills, n/v, cough, rash, dizziness, chest pain, abdominal pain or changes in bowel or bladder habits.  No swelling, tenderness, numbness or tingling in her extremities. No new aches or pains.   Medications:  Allergies as of 10/26/2016   No Known Allergies     Medication List       Accurate as of 10/26/16  3:52 PM. Always use your most recent med list.          albuterol 108 (90 Base) MCG/ACT inhaler Commonly known as:  PROVENTIL HFA;VENTOLIN HFA Inhale 2 puffs into the lungs every 6 (six) hours as needed for wheezing or shortness of breath.   ASPIRIN LOW DOSE 81 MG EC tablet Generic drug:  aspirin TAKE 1 TABLET(81 MG) BY MOUTH DAILY   folic acid 1 MG tablet Commonly known as:  FOLVITE Take 2 tablets (2 mg total) by mouth daily.   furosemide 40 MG tablet Commonly known as:  LASIX Take 1 tablet (40 mg total) by mouth daily.   levocetirizine 5 MG tablet Commonly known as:  XYZAL Take 1 tablet (5 mg total) by mouth every evening.   mometasone 50 MCG/ACT nasal spray Commonly known as:  NASONEX Place 2 sprays into the nose at bedtime.   montelukast 10 MG tablet Commonly known as:  SINGULAIR Take 1 tablet (10 mg total) by mouth at  bedtime.   MULTIPLE VITAMIN PO Take 1 tablet by mouth daily.       Allergies: No Known Allergies  Past Medical History, Surgical history, Social history, and Family History were reviewed and updated.  Review of Systems: All other 10 point review of systems is negative.   Physical Exam:  vitals were not taken for this visit.  Wt Readings from Last 3 Encounters:  10/23/16 256 lb 12.8 oz (116.5 kg)  08/24/16 252 lb (114.3 kg)  08/09/16 250 lb (113.4 kg)    Ocular: Sclerae unicteric, pupils equal, round and reactive to light Ear-nose-throat: Oropharynx clear, dentition fair Lymphatic: No cervical supraclavicular or axillary adenopathy Lungs no rales or rhonchi, good excursion bilaterally Heart regular rate and rhythm, no murmur appreciated Abd soft, nontender, positive bowel sounds, no liver or spleen tip palpated on exam  MSK no focal spinal tenderness, no joint edema Neuro: non-focal, well-oriented, appropriate affect Breasts: Deferred   Lab Results  Component Value Date   WBC 13.4 (H) 10/23/2016   HGB 12.6 10/23/2016   HCT 39.8 10/23/2016   MCV 65.7 Repeated and verified X2. (L) 10/23/2016   PLT 347.0 10/23/2016   Lab Results  Component Value Date   FERRITIN 94 08/24/2016   IRON 42 08/24/2016   TIBC 295 08/24/2016   UIBC 253 08/24/2016   IRONPCTSAT 14 (  L) 08/24/2016   Lab Results  Component Value Date   RBC 6.06 (H) 10/23/2016   No results found for: KPAFRELGTCHN, LAMBDASER, KAPLAMBRATIO No results found for: IGGSERUM, IGA, IGMSERUM No results found for: Odetta Pink, SPEI   Chemistry      Component Value Date/Time   NA 139 10/23/2016 1738   K 4.1 10/23/2016 1738   CL 101 10/23/2016 1738   CO2 31 10/23/2016 1738   BUN 9 10/23/2016 1738   CREATININE 0.70 10/23/2016 1738   CREATININE 0.71 04/18/2015 1102      Component Value Date/Time   CALCIUM 9.4 10/23/2016 1738   ALKPHOS 77 10/23/2016 1738    AST 14 10/23/2016 1738   ALT 11 10/23/2016 1738   BILITOT 0.4 10/23/2016 1738     Impression and Plan: [name] is a very charming 46 yo African American female with mild leukocytosis, Hgb C trait and Alpha thalassemia trait. She verbalized that she is taking her folic acid 2 mg daily as prescribed. MCV is still quite low. Hgb stable at 12.7.  She is symptomatic with anemia as mentioned above. We will see what her iron studies show and bring her back in next week for an infusion if needed.  We will go ahead and plan to see her back in 2 months for repeat lab work and follow-up.  She will contact our office with any questions or concerns. We can certainly see her sooner if need be.    Eliezer Bottom, NP 2/23/20183:52 PM

## 2016-10-27 LAB — RETICULOCYTES: Reticulocyte Count: 1.2 % (ref 0.6–2.6)

## 2016-10-27 LAB — COMPREHENSIVE METABOLIC PANEL (CC13)
ALT: 15 IU/L (ref 0–32)
AST (SGOT): 21 IU/L (ref 0–40)
Albumin, Serum: 4.1 g/dL (ref 3.5–5.5)
Albumin/Globulin Ratio: 1.3 (ref 1.2–2.2)
Alkaline Phosphatase, S: 80 IU/L (ref 39–117)
BUN / CREAT RATIO: 17 (ref 9–23)
BUN: 12 mg/dL (ref 6–24)
Bilirubin Total: 0.3 mg/dL (ref 0.0–1.2)
CO2: 22 mmol/L (ref 18–29)
CREATININE: 0.72 mg/dL (ref 0.57–1.00)
Calcium, Ser: 9.1 mg/dL (ref 8.7–10.2)
Chloride, Ser: 101 mmol/L (ref 96–106)
GFR calc non Af Amer: 103 mL/min/{1.73_m2} (ref >59)
GFR, EST AFRICAN AMERICAN: 119 mL/min/{1.73_m2} (ref >59)
Globulin, Total: 3.1 g/dL (ref 1.5–4.5)
Glucose: 80 mg/dL (ref 65–99)
Potassium, Ser: 4.3 mmol/L (ref 3.5–5.2)
Sodium: 139 mmol/L (ref 134–144)
TOTAL PROTEIN: 7.2 g/dL (ref 6.0–8.5)

## 2016-10-29 ENCOUNTER — Other Ambulatory Visit: Payer: Self-pay | Admitting: Family

## 2016-10-29 ENCOUNTER — Telehealth: Payer: Self-pay | Admitting: *Deleted

## 2016-10-29 LAB — IRON AND TIBC
%SAT: 16 % — AB (ref 21–57)
IRON: 45 ug/dL (ref 41–142)
TIBC: 291 ug/dL (ref 236–444)
UIBC: 246 ug/dL (ref 120–384)

## 2016-10-29 LAB — FERRITIN: Ferritin: 169 ng/ml (ref 9–269)

## 2016-10-29 NOTE — Telephone Encounter (Addendum)
Patient aware of results. Patient will call back to schedule  ----- Message from Eliezer Bottom, NP sent at 10/29/2016 11:41 AM EST ----- Regarding: Iron  Iron still low. Needs IV iron this week please. Thank you!  Sarah  ----- Message ----- From: Interface, Lab In Three Zero One Sent: 10/26/2016   3:56 PM To: Eliezer Bottom, NP

## 2016-11-02 ENCOUNTER — Ambulatory Visit: Payer: PRIVATE HEALTH INSURANCE

## 2016-11-07 ENCOUNTER — Ambulatory Visit (HOSPITAL_BASED_OUTPATIENT_CLINIC_OR_DEPARTMENT_OTHER): Payer: PRIVATE HEALTH INSURANCE

## 2016-11-07 ENCOUNTER — Ambulatory Visit (HOSPITAL_BASED_OUTPATIENT_CLINIC_OR_DEPARTMENT_OTHER)
Admission: RE | Admit: 2016-11-07 | Discharge: 2016-11-07 | Disposition: A | Discharge status: Home / Self Care | Payer: PRIVATE HEALTH INSURANCE | Source: Ambulatory Visit | Attending: Family Medicine | Admitting: Family Medicine

## 2016-11-07 VITALS — BP 114/55 | HR 73 | Temp 98.1°F | Resp 16

## 2016-11-07 DIAGNOSIS — R011 Cardiac murmur, unspecified: Secondary | ICD-10-CM | POA: Insufficient documentation

## 2016-11-07 DIAGNOSIS — I361 Nonrheumatic tricuspid (valve) insufficiency: Secondary | ICD-10-CM | POA: Diagnosis not present

## 2016-11-07 DIAGNOSIS — D509 Iron deficiency anemia, unspecified: Secondary | ICD-10-CM | POA: Diagnosis not present

## 2016-11-07 DIAGNOSIS — D500 Iron deficiency anemia secondary to blood loss (chronic): Secondary | ICD-10-CM

## 2016-11-07 MED ORDER — SODIUM CHLORIDE 0.9 % IV SOLN
510.0000 mg | Freq: Once | INTRAVENOUS | Status: AC
  Administered 2016-11-07: 510 mg via INTRAVENOUS
  Filled 2016-11-07: qty 17

## 2016-11-07 NOTE — Progress Notes (Signed)
  Echocardiogram 2D Echocardiogram has been performed.  Laurie Monroe 11/07/2016, 11:03 AM

## 2016-11-07 NOTE — Patient Instructions (Signed)

## 2016-11-09 ENCOUNTER — Telehealth: Payer: Self-pay | Admitting: *Deleted

## 2016-11-09 MED ORDER — CETIRIZINE HCL 10 MG PO TABS: 10.0000 mg | ORAL_TABLET | Freq: Every day | ORAL | 5 refills | Status: DC

## 2016-11-09 NOTE — Telephone Encounter (Signed)
Prescription sent in. Patient notified

## 2016-11-09 NOTE — Telephone Encounter (Signed)
Left message on machine to call back   Pharmacy faxed over a request to change xyzal because it is on back order.  Per Dr. Etter Sjogren patient can get over the counter xyzal or we can send in generic zyrtec 10mg  1 po qd #30 with 3 refills.

## 2016-11-09 NOTE — Telephone Encounter (Signed)
Patient would like to have generic zyrtec called in.

## 2016-11-09 NOTE — Telephone Encounter (Signed)
Called left message to call back 

## 2016-11-12 ENCOUNTER — Other Ambulatory Visit: Payer: Self-pay | Admitting: Family Medicine

## 2016-11-12 DIAGNOSIS — R011 Cardiac murmur, unspecified: Secondary | ICD-10-CM

## 2016-12-09 ENCOUNTER — Encounter: Payer: Self-pay | Admitting: Internal Medicine

## 2016-12-09 NOTE — Progress Notes (Signed)
New Outpatient Visit Date: 12/10/2016  Referring Provider: [name], DO [address] [place], [place] [address]  Chief Complaint: Heart murmur  HPI:  Ms. Matuszak is a 46 y.o. female who is being seen today for the evaluation of heart murmur at the request of Dr. Carollee Herter. She has a history of hypertension, seizures, thalassemia trait, and GERD. Approximately one month ago, she was evaluated by her PCP and was told about a heart murmur. Subsequent echocardiogram revealed moderate tricuspid regurgitation and mild pulmonary hypertension. The patient notes chronic lower extremity edema for which she takes furosemide about twice a week. She has also had sclerotherapy of the right calf due to varicose veins and edema. She did not notice significant improvement with this. Other than chronic leg swelling, Ms. Billy's only symptoms have been exertional dyspnea that she experiences when climbing stairs. She notes that it is more pronounced than a year ago, though she has also gained 30 pounds over that time. She has noticed some indigestion and acid reflux with spicy food but otherwise no chest pain. She denies exertional chest pain. She also denies orthopnea and PND. She has been noted to snore at night and has never been evaluated for sleep apnea. She denies palpitations, prior cardiac disease (including endocarditis), bacteremia, and use of diet pills. She consumes 2 caffeinated sodas a day.  --------------------------------------------------------------------------------------------------  Cardiovascular History & Procedures: Cardiovascular Problems:  Tricuspid regurgitation  Mild pulmonary hypertension by echo  Risk Factors:  Hypertension, family history, and obesity  Cath/PCI:  None  CV Surgery:  None  EP Procedures and Devices:  None  Non-Invasive Evaluation(s):  TTE (11/07/16): Normal LV size with LVEF 65-70%. Normal diastolic function. Normal RV size and  function. Moderate TR, mild PR. Mild pulmonary hypertension.  RLE venous duples (07/24/14): No evidence of DVT. Post ablation and greater saphenous vein, with tortuous thrombosis segment of greater saphenous vein corresponding to the area of palpable concern.  Recent CV Pertinent Labs: Lab Results  Component Value Date   CHOL 125 10/23/2016   HDL 53.60 10/23/2016   LDLCALC 62 10/23/2016   TRIG 51.0 10/23/2016   CHOLHDL 2 10/23/2016   BNP 54.5 04/18/2015   K 4.3 10/26/2016   BUN 12 10/26/2016   CREATININE 0.72 10/26/2016   CREATININE 0.71 04/18/2015    --------------------------------------------------------------------------------------------------  Past Medical History:  Diagnosis Date  . Allergy   . Anemia   . Chicken pox   . Epilepsy ([place])    Hx of Epilepsy. No seizures in over 20 years  . GERD (gastroesophageal reflux disease)   . Hypertension   . Leukocytosis 12/28/2015  . Seizures ([place])    last episode 20 years ago  . Thalassemia trait, alpha 12/28/2015    Past Surgical History:  Procedure Laterality Date  . ABDOMINAL HYSTERECTOMY N/A 03/28/2016   Procedure: HYSTERECTOMY ABDOMINAL;  Surgeon: Cheri Fowler, MD;  Location: [facility];  Service: Gynecology;  Laterality: N/A;  . BILATERAL SALPINGECTOMY Bilateral 03/28/2016   Procedure: BILATERAL SALPINGECTOMY;  Surgeon: Cheri Fowler, MD;  Location: [facility];  Service: Gynecology;  Laterality: Bilateral;  . DILATION AND CURETTAGE OF UTERUS     Heavy Periods  . VARICOSE VEIN SURGERY      Outpatient Encounter Prescriptions as of 12/10/2016  Medication Sig  . albuterol (PROVENTIL HFA;VENTOLIN HFA) 108 (90 BASE) MCG/ACT inhaler Inhale 2 puffs into the lungs every 6 (six) hours as needed for wheezing or shortness of breath.  . ASPIRIN LOW  DOSE 81 MG EC tablet TAKE 1 TABLET(81 MG) BY MOUTH DAILY  . cetirizine (ZYRTEC) 10 MG tablet Take 1 tablet (10 mg total) by mouth daily.  . folic acid (FOLVITE) 1 MG tablet Take 2 tablets (2  mg total) by mouth daily.  . furosemide (LASIX) 40 MG tablet Take 1 tablet (40 mg total) by mouth daily.  Marland Kitchen levocetirizine (XYZAL) 5 MG tablet Take 1 tablet (5 mg total) by mouth every evening.  . mometasone (NASONEX) 50 MCG/ACT nasal spray Place 2 sprays into the nose at bedtime.  . montelukast (SINGULAIR) 10 MG tablet Take 1 tablet (10 mg total) by mouth at bedtime.  . MULTIPLE VITAMIN PO Take 1 tablet by mouth daily.    No facility-administered encounter medications on file as of 12/10/2016.     Allergies: Patient has no known allergies.  Social History   Social History  . Marital status: Single    Spouse name: N/A  . Number of children: N/A  . 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 Smoking status: Never Smoker  . Smokeless tobacco: Never Used  . Alcohol use No  . Drug use: No  . Sexual activity: Yes    Birth control/ protection: None   Other Topics Concern  . Not on file   Social History Narrative  . No narrative on file    Family History  Problem Relation Age of Onset  . Hypertension Mother   . Prostate cancer Father   . Hypertension Father   . Liver cancer Father   . Stomach cancer Father   . Heart disease Father   . Hypertension Brother     Twin Brother  . Heart disease Brother 41    coronary stent    Review of Systems: A 12-system review of systems was performed and was negative except as noted in the HPI.  --------------------------------------------------------------------------------------------------  Physical Exam: BP 120/80   Pulse 89   Ht 5\' 6"  (1.676 m)   Wt 258 lb 6.4 oz (117.2 kg)   LMP 03/28/2016 (Exact Date)   SpO2 98%   BMI 41.71 kg/m   General:  Obese woman, seated comfortably in the exam room. HEENT: No conjunctival pallor or scleral icterus.  Moist mucous membranes.  OP clear. Neck: Supple without lymphadenopathy, thyromegaly, JVD, or HJR.  No carotid bruit. Lungs:  Normal work of breathing.  Clear to auscultation bilaterally without wheezes or crackles. Heart: Distant heart sounds. 1/6 systolic murmur loudest at the left lower sternal border. No rubs or gallops.  Unable to assess PMI due to body habitus. Abd: Bowel sounds present.  Soft, NT/ND. Unable to assess hepatosplenomegaly due to body habitus. Ext: Chronic appearing 1+ pretibial edema bilaterally.  Radial, PT, and DP pulses are 2+ bilaterally Skin: warm and dry without rash Neuro: CNIII-XII intact.  Strength and fine-touch sensation intact in upper and lower extremities bilaterally. Psych: Normal mood and affect.  EKG:  Normal sinus rhythm without abnormalities. No change from prior tracing on 10/10/15 (I personally reviewed both tracings).  Lab Results  Component Value Date   WBC 13.6 (H) 10/26/2016   HGB 12.7 10/26/2016   HCT 35.9 10/26/2016   MCV 62 (L) 10/26/2016   PLT 327 10/26/2016    Lab Results  Component Value Date   NA 139 10/26/2016   K 4.3 10/26/2016   CL 101 10/26/2016   CO2 22 10/26/2016   BUN 12 10/26/2016  CREATININE 0.72 10/26/2016   GLUCOSE 80 10/26/2016   ALT 15 10/26/2016    Lab Results  Component Value Date   CHOL 125 10/23/2016   HDL 53.60 10/23/2016   LDLCALC 62 10/23/2016   TRIG 51.0 10/23/2016   CHOLHDL 2 10/23/2016   Lab Results  Component Value Date   TSH 1.031 04/18/2015   --------------------------------------------------------------------------------------------------  ASSESSMENT AND PLAN: Heart murmur, tricuspid regurgitation, and pulmonary hypertension Ms. Barreira was recently noted to have a heart murmur on exam with subsequent echocardiogram demonstrating moderate tricuspid regurgitation and mild pulmonary hypertension. A small amount of tricuspid regurgitation is physiologic, though not to the extent seen on the patient's recent echocardiogram. She does not have any concerning risk factors, such as history of endocarditis, IV drug use,  cardiac instrumentation crossing the tricuspid valve, diet pill use, and congenital heart disease. Given her IV habitus and history of snoring, I am most concerned that she may have sleep apnea, contributing to her tricuspid regurgitation and mild pulmonary hypertension. Thalassemia and chronic anemia can also lead to pulmonary hypertension, though her recent CBCs demonstrate relatively normal hemoglobin. We have agreed to obtain a sleep study for further evaluation. I will also obtain a PA and lateral chest radiograph to screen for parenchymal lung disease. We will defer more advanced imaging such as TEE, cardiac MRI, and right heart catheterization. The patient has chronic leg edema that is likely multifactorial; she does not have other findings suggestive of right heart failure. I think it is reasonable to continue her current Lasix regimen, albeit with more consistent use during the week. I have also encouraged Ms. Arya to wear compression stockings to help with her leg edema. She would also benefit from weight loss.  Follow-up: Return to clinic in 3 months.  Nelva Bush, MD 12/11/2016 8:26 PM

## 2016-12-10 ENCOUNTER — Encounter: Payer: Self-pay | Admitting: Internal Medicine

## 2016-12-10 ENCOUNTER — Ambulatory Visit (INDEPENDENT_AMBULATORY_CARE_PROVIDER_SITE_OTHER): Payer: PRIVATE HEALTH INSURANCE | Admitting: Internal Medicine

## 2016-12-10 ENCOUNTER — Encounter (INDEPENDENT_AMBULATORY_CARE_PROVIDER_SITE_OTHER): Payer: Self-pay

## 2016-12-10 VITALS — BP 120/80 | HR 89 | Ht 66.0 in | Wt 258.4 lb

## 2016-12-10 DIAGNOSIS — R0683 Snoring: Secondary | ICD-10-CM | POA: Diagnosis not present

## 2016-12-10 DIAGNOSIS — I272 Pulmonary hypertension, unspecified: Secondary | ICD-10-CM | POA: Diagnosis not present

## 2016-12-10 DIAGNOSIS — I071 Rheumatic tricuspid insufficiency: Secondary | ICD-10-CM

## 2016-12-10 DIAGNOSIS — R011 Cardiac murmur, unspecified: Secondary | ICD-10-CM | POA: Diagnosis not present

## 2016-12-10 NOTE — Patient Instructions (Signed)
Medication Instructions:  Your physician recommends that you continue on your current medications as directed. Please refer to the Current Medication list given to you today.   Labwork: none  Testing/Procedures: A chest x-ray takes a picture of the organs and structures inside the chest, including the heart, lungs, and blood vessels. This test can show several things, including, whether the heart is enlarges; whether fluid is building up in the lungs.  Your physician has recommended that you have a sleep study. This test records several body functions during sleep, including: brain activity, eye movement, oxygen and carbon dioxide blood levels, heart rate and rhythm, breathing rate and rhythm, the flow of air through your mouth and nose, snoring, body muscle movements, and chest and belly movement.     Follow-Up: Your physician recommends that you schedule a follow-up appointment in: 3 months with Dr End.   Any Other Special Instructions Will Be Listed Below (If Applicable).  Use compression stockings-put them on in the morning and take them off when you go to bed at night. [facility] on [address] is a good place to go to see about these.    If you need a refill on your cardiac medications before your next appointment, please call your pharmacy.

## 2016-12-13 ENCOUNTER — Telehealth: Payer: Self-pay | Admitting: Internal Medicine

## 2016-12-13 NOTE — Telephone Encounter (Signed)
I had called pt to see if she had a chest x-ray since her office visit with Dr End earlier in the week. Pt states she has not had it yet and intends to go Monday December 17, 2016.

## 2016-12-13 NOTE — Telephone Encounter (Signed)
New message    Pt said she is returning call to [facility].

## 2016-12-17 ENCOUNTER — Telehealth: Payer: Self-pay | Admitting: *Deleted

## 2016-12-17 NOTE — Telephone Encounter (Signed)
Patient notified of sleep study 

## 2016-12-21 ENCOUNTER — Ambulatory Visit
Admission: RE | Admit: 2016-12-21 | Discharge: 2016-12-21 | Disposition: A | Discharge status: Home / Self Care | Payer: PRIVATE HEALTH INSURANCE | Source: Ambulatory Visit | Attending: Internal Medicine | Admitting: Internal Medicine

## 2016-12-21 DIAGNOSIS — R0683 Snoring: Secondary | ICD-10-CM

## 2016-12-21 DIAGNOSIS — I272 Pulmonary hypertension, unspecified: Secondary | ICD-10-CM

## 2016-12-23 ENCOUNTER — Other Ambulatory Visit: Payer: Self-pay | Admitting: Family Medicine

## 2016-12-23 DIAGNOSIS — R600 Localized edema: Secondary | ICD-10-CM

## 2016-12-25 ENCOUNTER — Telehealth: Payer: Self-pay | Admitting: Internal Medicine

## 2016-12-25 NOTE — Telephone Encounter (Signed)
Patient states that she is returning your call, thanks.

## 2016-12-25 NOTE — Telephone Encounter (Signed)
Spoke with patient about recent chest xray results.

## 2016-12-28 ENCOUNTER — Ambulatory Visit (HOSPITAL_BASED_OUTPATIENT_CLINIC_OR_DEPARTMENT_OTHER): Payer: PRIVATE HEALTH INSURANCE | Admitting: Family

## 2016-12-28 ENCOUNTER — Other Ambulatory Visit (HOSPITAL_BASED_OUTPATIENT_CLINIC_OR_DEPARTMENT_OTHER): Payer: PRIVATE HEALTH INSURANCE

## 2016-12-28 VITALS — BP 118/66 | HR 92 | Temp 99.2°F | Resp 18 | Wt 253.1 lb

## 2016-12-28 DIAGNOSIS — D582 Other hemoglobinopathies: Secondary | ICD-10-CM

## 2016-12-28 DIAGNOSIS — D508 Other iron deficiency anemias: Secondary | ICD-10-CM

## 2016-12-28 DIAGNOSIS — D563 Thalassemia minor: Secondary | ICD-10-CM

## 2016-12-28 DIAGNOSIS — D509 Iron deficiency anemia, unspecified: Secondary | ICD-10-CM | POA: Diagnosis not present

## 2016-12-28 DIAGNOSIS — D72829 Elevated white blood cell count, unspecified: Secondary | ICD-10-CM

## 2016-12-28 LAB — CBC WITH DIFFERENTIAL (CANCER CENTER ONLY)
BASO#: 0 10*3/uL (ref 0.0–0.2)
BASO%: 0.2 % (ref 0.0–2.0)
EOS ABS: 0.2 10*3/uL (ref 0.0–0.5)
EOS%: 1.8 % (ref 0.0–7.0)
HCT: 37.4 % (ref 34.8–46.6)
HGB: 13.1 g/dL (ref 11.6–15.9)
LYMPH#: 3.1 10*3/uL (ref 0.9–3.3)
LYMPH%: 23.9 % (ref 14.0–48.0)
MCH: 21.8 pg — AB (ref 26.0–34.0)
MCHC: 35 g/dL (ref 32.0–36.0)
MCV: 62 fL — ABNORMAL LOW (ref 81–101)
MONO#: 1.1 10*3/uL — ABNORMAL HIGH (ref 0.1–0.9)
MONO%: 8.1 % (ref 0.0–13.0)
NEUT#: 8.7 10*3/uL — ABNORMAL HIGH (ref 1.5–6.5)
NEUT%: 66 % (ref 39.6–80.0)
Platelets: 359 10*3/uL (ref 145–400)
RBC: 6.02 10*6/uL — ABNORMAL HIGH (ref 3.70–5.32)
RDW: 15.2 % (ref 11.1–15.7)
WBC: 13.1 10*3/uL — ABNORMAL HIGH (ref 3.9–10.0)

## 2016-12-28 NOTE — Progress Notes (Signed)
Hematology and Oncology Follow Up Visit  Laurie Monroe [id number] 1971-04-13 46 y.o. 12/28/2016   Principle Diagnosis:  Hgb C trait (sickle cell trait) Alpha thalassemia trait Iron deficiency anemia  Mild leukocytosis   Current Therapy:   Folic acid 2 mg PO daily  IV iron as indicated - last received in March 2018   Interim History:  Laurie Monroe is here today for follow-up. She is having some mild fatigue at times. She last received IV iron in March. Her iron saturation was low at 16% at that time. Hgb today is 13.1 with an MCV of 62.  She verbalized that she is taking her folic acid daily as prescribed.  She is concerned about her weight and has started to eat a healthier diet and watch her portion sizes. She is staying hydrated. Her weight is stable.  She has been exercising 3 days a week walking on her treadmill. She is also staying busy with work.  No episodes of bleeding or bruising. No lymphadenopathy found on exam.  There has been no issue with frequent infection. She has had no fever, chills, n/v, cough, rash, dizziness, SOB, chest pain, palpitations, abdominal pain or changes in bowel or bladder habits.  No swelling, tenderness, numbness or tingling in her extremities.   ECOG Performance Status: 1 - Symptomatic but completely ambulatory  Medications:  Allergies as of 12/28/2016   No Known Allergies     Medication List       Accurate as of 12/28/16  3:25 PM. Always use your most recent med list.          albuterol 108 (90 Base) MCG/ACT inhaler Commonly known as:  PROVENTIL HFA;VENTOLIN HFA Inhale 2 puffs into the lungs every 6 (six) hours as needed for wheezing or shortness of breath.   ASPIRIN LOW DOSE 81 MG EC tablet Generic drug:  aspirin TAKE 1 TABLET(81 MG) BY MOUTH DAILY   cetirizine 10 MG tablet Commonly known as:  ZYRTEC Take 1 tablet (10 mg total) by mouth daily.   folic acid 1 MG tablet Commonly known as:  FOLVITE Take 2 tablets (2 mg total) by  mouth daily.   furosemide 40 MG tablet Commonly known as:  LASIX TAKE 1 TABLET(40 MG) BY MOUTH DAILY   levocetirizine 5 MG tablet Commonly known as:  XYZAL Take 1 tablet (5 mg total) by mouth every evening.   mometasone 50 MCG/ACT nasal spray Commonly known as:  NASONEX Place 2 sprays into the nose at bedtime.   montelukast 10 MG tablet Commonly known as:  SINGULAIR TAKE 1 TABLET(10 MG) BY MOUTH AT BEDTIME   MULTIPLE VITAMIN PO Take 1 tablet by mouth daily.   promethazine-dextromethorphan 6.25-15 MG/5ML syrup Commonly known as:  PROMETHAZINE-DM Take 5 mLs by mouth daily as needed for cough.       Allergies: No Known Allergies  Past Medical History, Surgical history, Social history, and Family History were reviewed and updated.  Review of Systems: All other 10 point review of systems is negative.   Physical Exam:  vitals were not taken for this visit.  Wt Readings from Last 3 Encounters:  12/10/16 258 lb 6.4 oz (117.2 kg)  10/26/16 257 lb (116.6 kg)  10/23/16 256 lb 12.8 oz (116.5 kg)    Ocular: Sclerae unicteric, pupils equal, round and reactive to light Ear-nose-throat: Oropharynx clear, dentition fair Lymphatic: No cervical, supraclavicular or axillary adenopathy Lungs no rales or rhonchi, good excursion bilaterally Heart regular rate and rhythm, no murmur appreciated  Abd soft, nontender, positive bowel sounds, no liver or spleen tip palpated on exam, no fluid wave MSK no focal spinal tenderness, no joint edema Neuro: non-focal, well-oriented, appropriate affect Breasts: Deferred   Lab Results  Component Value Date   WBC 13.6 (H) 10/26/2016   HGB 12.7 10/26/2016   HCT 35.9 10/26/2016   MCV 62 (L) 10/26/2016   PLT 327 10/26/2016   Lab Results  Component Value Date   FERRITIN 169 10/26/2016   IRON 45 10/26/2016   TIBC 291 10/26/2016   UIBC 246 10/26/2016   IRONPCTSAT 16 (L) 10/26/2016   Lab Results  Component Value Date   RBC 5.81 (H) 10/26/2016    No results found for: KPAFRELGTCHN, LAMBDASER, KAPLAMBRATIO No results found for: IGGSERUM, IGA, IGMSERUM No results found for: Laurie Monroe, SPEI   Chemistry      Component Value Date/Time   NA 139 10/26/2016 1543   K 4.3 10/26/2016 1543   CL 101 10/26/2016 1543   CO2 22 10/26/2016 1543   BUN 12 10/26/2016 1543   CREATININE 0.72 10/26/2016 1543   CREATININE 0.71 04/18/2015 1102      Component Value Date/Time   CALCIUM 9.1 10/26/2016 1543   ALKPHOS 80 10/26/2016 1543   AST 21 10/26/2016 1543   ALT 15 10/26/2016 1543   BILITOT 0.3 10/26/2016 1543      Impression and Plan: Laurie Monroe is a pleasant 46 yo African American female with iron deficiency anemia, alpha thalassemia, sickle cell trait and mild leukocytosis. She states that she has some intermittent fatigue. We will see what her iron studied show and bring her back in next week for an infusion if needed.  She will continue to take her folic acid daily as prescribed.  We will plan to see her back in 4 months for repeat lab work and follow-up.  She will contact our office with any questions or concerns. We can certainly see her sooner if need be.   Eliezer Bottom, NP 4/27/20183:25 PM

## 2016-12-31 LAB — IRON AND TIBC
%SAT: 12 % — ABNORMAL LOW (ref 21–57)
Iron: 34 ug/dL — ABNORMAL LOW (ref 41–142)
TIBC: 278 ug/dL (ref 236–444)
UIBC: 244 ug/dL (ref 120–384)

## 2016-12-31 LAB — FERRITIN: FERRITIN: 311 ng/mL — AB (ref 9–269)

## 2017-01-01 ENCOUNTER — Telehealth: Payer: Self-pay | Admitting: *Deleted

## 2017-01-01 NOTE — Telephone Encounter (Addendum)
Patient is aware is results. Message sent to scheduler  ----- Message from Eliezer Bottom, NP sent at 01/01/2017  3:13 PM EDT ----- Regarding: Iron  She will need one dose of IV iron please. Thank you!  Laurie Monroe  ----- Message ----- From: Interface, Lab In Three Zero One Sent: 12/28/2016   3:48 PM To: Eliezer Bottom, NP

## 2017-01-03 ENCOUNTER — Other Ambulatory Visit: Payer: PRIVATE HEALTH INSURANCE

## 2017-01-03 ENCOUNTER — Ambulatory Visit: Payer: PRIVATE HEALTH INSURANCE | Admitting: Family

## 2017-01-10 ENCOUNTER — Ambulatory Visit (HOSPITAL_BASED_OUTPATIENT_CLINIC_OR_DEPARTMENT_OTHER): Payer: PRIVATE HEALTH INSURANCE

## 2017-01-10 VITALS — BP 121/70 | HR 72 | Temp 98.3°F | Resp 18

## 2017-01-10 DIAGNOSIS — D509 Iron deficiency anemia, unspecified: Secondary | ICD-10-CM | POA: Diagnosis not present

## 2017-01-10 DIAGNOSIS — D500 Iron deficiency anemia secondary to blood loss (chronic): Secondary | ICD-10-CM

## 2017-01-10 MED ORDER — SODIUM CHLORIDE 0.9 % IV SOLN
510.0000 mg | Freq: Once | INTRAVENOUS | Status: AC
  Administered 2017-01-10: 510 mg via INTRAVENOUS
  Filled 2017-01-10: qty 17

## 2017-01-10 NOTE — Patient Instructions (Signed)

## 2017-02-03 ENCOUNTER — Ambulatory Visit (HOSPITAL_BASED_OUTPATIENT_CLINIC_OR_DEPARTMENT_OTHER): Payer: PRIVATE HEALTH INSURANCE | Attending: Internal Medicine | Admitting: Cardiology

## 2017-02-03 VITALS — Ht 66.0 in | Wt 245.0 lb

## 2017-02-03 DIAGNOSIS — R0683 Snoring: Secondary | ICD-10-CM

## 2017-02-03 DIAGNOSIS — I272 Pulmonary hypertension, unspecified: Secondary | ICD-10-CM

## 2017-02-04 LAB — HM PAP SMEAR

## 2017-02-04 NOTE — Procedures (Signed)
   Patient Name: [name], Laurie Monroe Date: 02/03/2017 Gender: Female D.O.B: Feb 27, 1971 Age (years): 14 Referring Provider: Nelva Bush MD Height (inches): 66 Interpreting Physician: Fransico Him MD, ABSM Weight (lbs): 245 RPSGT: Madelon Lips BMI: 40 MRN: [id number] Neck Size: 15.00  CLINICAL INFORMATION Sleep Study Type: NPSG  Indication for sleep study: Snoring  Epworth Sleepiness Score: 11  SLEEP STUDY TECHNIQUE As per the AASM Manual for the Scoring of Sleep and Associated Events v2.3 (April 2016) with a hypopnea requiring 4% desaturations.  The channels recorded and monitored were frontal, central and occipital EEG, electrooculogram (EOG), submentalis EMG (chin), nasal and oral airflow, thoracic and abdominal wall motion, anterior tibialis EMG, snore microphone, electrocardiogram, and pulse oximetry.  MEDICATIONS Medications self-administered by patient taken the night of the study : N/A  SLEEP ARCHITECTURE The study was initiated at 10:22:30 PM and ended at 4:30:10 AM.  Sleep onset time was 15.2 minutes and the sleep efficiency was 87.7%. The total sleep time was 322.4 minutes.  Stage REM latency was 57.0 minutes.  The patient spent 9.46% of the night in stage N1 sleep, 63.87% in stage N2 sleep, 1.86% in stage N3 and 24.81% in REM.  Alpha intrusion was absent.  Supine sleep was 0.00%.  RESPIRATORY PARAMETERS The overall apnea/hypopnea index (AHI) was 2.4 per hour. There were 0 total apneas, including 0 obstructive, 0 central and 0 mixed apneas. There were 13 hypopneas and 3 RERAs.  The AHI during Stage REM sleep was 7.5 per hour.  AHI while supine was N/A per hour.  The mean oxygen saturation was 96.27%. The minimum SpO2 during sleep was 91.00%.  Loud snoring was noted during this study.  CARDIAC DATA The 2 lead EKG demonstrated sinus rhythm. The mean heart rate was 73.51 beats per minute. Other EKG findings include: None.  LEG MOVEMENT DATA The  total PLMS were 0 with a resulting PLMS index of 0.00. Associated arousal with leg movement index was 0.0 .  IMPRESSIONS - No significant obstructive sleep apnea occurred during this study (AHI = 2.4/h). - No significant central sleep apnea occurred during this study (CAI = 0.0/h). - The patient had minimal or no oxygen desaturation during the study (Min O2 = 91.00%) - The patient snored with Loud snoring volume. - No cardiac abnormalities were noted during this study. - Clinically significant periodic limb movements did not occur during sleep. No significant associated arousals.  DIAGNOSIS - Normal study  RECOMMENDATIONS - Avoid alcohol, sedatives and other CNS depressants that may worsen sleep apnea and disrupt normal sleep architecture. - Sleep hygiene should be reviewed to assess factors that may improve sleep quality. - Weight management and regular exercise should be initiated or continued if appropriate.  [place], American Board of Sleep Medicine  ELECTRONICALLY SIGNED ON:  02/04/2017, 7:25 PM [place] PH: [phone]   FX: [phone] [place]

## 2017-02-11 ENCOUNTER — Telehealth: Payer: Self-pay | Admitting: *Deleted

## 2017-02-11 NOTE — Telephone Encounter (Signed)
-----   Message from Sueanne Margarita, MD sent at 02/04/2017  7:27 PM EDT ----- Please let patient know that sleep study showed no significant sleep apnea.

## 2017-02-11 NOTE — Telephone Encounter (Signed)
Informed patient of sleep study results and patient understanding was verbalized. Patient understands there are no further follow up appointments. Patient was grateful for the call.

## 2017-02-20 ENCOUNTER — Encounter: Payer: Self-pay | Admitting: *Deleted

## 2017-03-22 ENCOUNTER — Encounter: Payer: Self-pay | Admitting: Internal Medicine

## 2017-03-22 ENCOUNTER — Ambulatory Visit (INDEPENDENT_AMBULATORY_CARE_PROVIDER_SITE_OTHER): Payer: PRIVATE HEALTH INSURANCE | Admitting: Internal Medicine

## 2017-03-22 VITALS — BP 116/80 | HR 99 | Ht 66.0 in | Wt 255.4 lb

## 2017-03-22 DIAGNOSIS — I272 Pulmonary hypertension, unspecified: Secondary | ICD-10-CM | POA: Diagnosis not present

## 2017-03-22 DIAGNOSIS — R600 Localized edema: Secondary | ICD-10-CM | POA: Diagnosis not present

## 2017-03-22 DIAGNOSIS — I361 Nonrheumatic tricuspid (valve) insufficiency: Secondary | ICD-10-CM | POA: Diagnosis not present

## 2017-03-22 NOTE — Patient Instructions (Signed)
Medication Instructions:  Your physician recommends that you continue on your current medications as directed. Please refer to the Current Medication list given to you today.   Labwork: None   Testing/Procedures: None  Follow-Up: Your physician wants you to follow-up in: 6 months with Dr End. (January 2019). You will receive a reminder letter in the mail two months in advance. If you don't receive a letter, please call our office to schedule the follow-up appointment.      If you need a refill on your cardiac medications before your next appointment, please call your pharmacy.

## 2017-03-22 NOTE — Progress Notes (Signed)
Follow-up Outpatient Visit Date: 03/22/2017  Primary Care Provider: [name], DO [address] [place] [address]  Chief Complaint: Leg swelling  HPI:  Ms. Caison is a 46 y.o. year-old female with history of tricuspid regurgitation, hypertension, seizures, thalassemia trait, and GERD, who presents for follow-up of leg swelling and tricuspid regurgitation. I last saw her on 12/10/16, at which time we agreed to obtain a sleep study and try compression stockings to help with her leg swelling. Sleep study showed no evidence of OSA. Her edema improved with knee-high compression stockings, though she felt as though they would begin behind her knees. She remains on furosemide 40 mg daily, though she does not take it all the time due to urinary frequency (particularly while on vacation earlier this mont). She has not had any chest pain, palpitations, lightheadedness, orthopnea, PND, or shortness of breath..  --------------------------------------------------------------------------------------------------  Cardiovascular History & Procedures: Cardiovascular Problems:  Tricuspid regurgitation  Mild pulmonary hypertension by echo  Risk Factors:  Hypertension, family history, and obesity  Cath/PCI:  None  CV Surgery:  None  EP Procedures and Devices:  None  Non-Invasive Evaluation(s):  TTE (11/07/16): Normal LV size with LVEF 65-70%. Normal diastolic function. Normal RV size and function. Moderate TR, mild PR. Mild pulmonary hypertension.  RLE venous duples (07/24/14): No evidence of DVT. Post ablation and greater saphenous vein, with tortuous thrombosis segment of greater saphenous vein corresponding to the area of palpable concern.  Recent CV Pertinent Labs: Lab Results  Component Value Date   CHOL 125 10/23/2016   HDL 53.60 10/23/2016   LDLCALC 62 10/23/2016   TRIG 51.0 10/23/2016   CHOLHDL 2 10/23/2016   BNP 54.5 04/18/2015   K 4.3  10/26/2016   BUN 12 10/26/2016   CREATININE 0.72 10/26/2016   CREATININE 0.71 04/18/2015    Past medical and surgical history were reviewed and updated in EPIC.  Current Meds  Medication Sig  . albuterol (PROVENTIL HFA;VENTOLIN HFA) 108 (90 BASE) MCG/ACT inhaler Inhale 2 puffs into the lungs every 6 (six) hours as needed for wheezing or shortness of breath.  . ASPIRIN LOW DOSE 81 MG EC tablet TAKE 1 TABLET(81 MG) BY MOUTH DAILY  . cetirizine (ZYRTEC) 10 MG tablet Take 1 tablet (10 mg total) by mouth daily.  . folic acid (FOLVITE) 1 MG tablet Take 2 tablets (2 mg total) by mouth daily.  . furosemide (LASIX) 40 MG tablet TAKE 1 TABLET(40 MG) BY MOUTH DAILY  . mometasone (NASONEX) 50 MCG/ACT nasal spray Place 2 sprays into the nose at bedtime.  . montelukast (SINGULAIR) 10 MG tablet TAKE 1 TABLET(10 MG) BY MOUTH AT BEDTIME  . MULTIPLE VITAMIN PO Take 1 tablet by mouth daily.   . promethazine-dextromethorphan (PROMETHAZINE-DM) 6.25-15 MG/5ML syrup Take 5 mLs by mouth daily as needed for cough.    Allergies: Patient has no known allergies.  Social History   Social History  . Marital status: Single    Spouse name: N/A  . Number of children: N/A  . 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 Smoking status: Never Smoker  . Smokeless tobacco: Never Used  . Alcohol use No  . Drug use: No  . Sexual activity: Yes    Birth control/ protection: None   Other Topics Concern  . Not on file   Social History Narrative  . No narrative on file  Family History  Problem Relation Age of Onset  . Hypertension Mother   . Prostate cancer Father   . Hypertension Father   . Liver cancer Father   . Stomach cancer Father   . Heart disease Father   . Hypertension Brother        Twin Brother  . Heart disease Brother 31       coronary stent    Review of Systems: A 12-system review of systems was performed and was negative except  as noted in the HPI.  --------------------------------------------------------------------------------------------------  Physical Exam: BP 116/80   Pulse 99   Ht 5\' 6"  (1.676 m)   Wt 255 lb 6.4 oz (115.8 kg)   LMP 03/28/2016 (Exact Date)   SpO2 96%   BMI 41.22 kg/m   General:  Morbidly obese woman, seated comfortably in the exam room. HEENT: No conjunctival pallor or scleral icterus. Moist mucous membranes.  OP clear. Neck: Supple without lymphadenopathy, thyromegaly, JVD, or HJR. No carotid bruit. Lungs: Normal work of breathing. Clear to auscultation bilaterally without wheezes or crackles. Heart: Regular rate and rhythm without murmurs, rubs, or gallops. Unable to assess PMI due to body habitus. Abd: Bowel sounds present. Soft, NT/ND. Unable to assess HSM due to body habitus. Ext: Trace lower extremity edema. Radial, PT, and DP pulses are 2+ bilaterally. Skin: Warm and dry without rash.  Lab Results  Component Value Date   WBC 13.1 (H) 12/28/2016   HGB 13.1 12/28/2016   HCT 37.4 12/28/2016   MCV 62 (L) 12/28/2016   PLT 359 12/28/2016    Lab Results  Component Value Date   NA 139 10/26/2016   K 4.3 10/26/2016   CL 101 10/26/2016   CO2 22 10/26/2016   BUN 12 10/26/2016   CREATININE 0.72 10/26/2016   GLUCOSE 80 10/26/2016   ALT 15 10/26/2016    Lab Results  Component Value Date   CHOL 125 10/23/2016   HDL 53.60 10/23/2016   LDLCALC 62 10/23/2016   TRIG 51.0 10/23/2016   CHOLHDL 2 10/23/2016    --------------------------------------------------------------------------------------------------  ASSESSMENT AND PLAN: Tricuspid regurgitation and mild pulmonary hypertension by echocardiogram Patient is asymptomatic. Today is unremarkable except for trivial lower extremity edema. No murmurs appreciated on exam, though body habitus does limited evaluation. Sleep study after her last visit to not reveal evidence of obstructive sleep apnea. We have agreed to continue  Ms. Celona's current medications. I have encouraged her to lose weight. We will reassess her symptoms in 6 months and discuss repeating an echo at that time to reevaluate her TR and pulmonary artery pressures. If there has been no improvement or worsening of the TR and pulmonary hypertension, we will need to consider additional testing.  Lower extremity edema This is likely multifactorial, including her tricuspid regurgitation, obesity, and venous insufficiency. I have provided her with a prescription for thigh-high compression stockings, which will hopefully be more comfortable for her. I encouraged Ms. Middlekauff to continue using furosemide on a daily basis.  Follow-up: Return to clinic in 6 months.  Nelva Bush, MD 03/22/2017 4:39 PM

## 2017-03-24 ENCOUNTER — Encounter: Payer: Self-pay | Admitting: Internal Medicine

## 2017-03-24 DIAGNOSIS — I361 Nonrheumatic tricuspid (valve) insufficiency: Secondary | ICD-10-CM | POA: Insufficient documentation

## 2017-03-24 DIAGNOSIS — I272 Pulmonary hypertension, unspecified: Secondary | ICD-10-CM | POA: Insufficient documentation

## 2017-04-26 ENCOUNTER — Ambulatory Visit: Payer: PRIVATE HEALTH INSURANCE | Admitting: Family

## 2017-04-26 ENCOUNTER — Other Ambulatory Visit: Payer: PRIVATE HEALTH INSURANCE

## 2017-05-01 ENCOUNTER — Other Ambulatory Visit: Payer: Self-pay | Admitting: Family

## 2017-05-01 DIAGNOSIS — D582 Other hemoglobinopathies: Secondary | ICD-10-CM

## 2017-05-01 DIAGNOSIS — D563 Thalassemia minor: Secondary | ICD-10-CM

## 2017-05-10 ENCOUNTER — Other Ambulatory Visit: Payer: Self-pay | Admitting: Family Medicine

## 2017-06-25 ENCOUNTER — Other Ambulatory Visit: Payer: Self-pay

## 2017-06-25 ENCOUNTER — Encounter: Payer: Self-pay | Admitting: Family Medicine

## 2017-06-25 ENCOUNTER — Ambulatory Visit (INDEPENDENT_AMBULATORY_CARE_PROVIDER_SITE_OTHER): Payer: PRIVATE HEALTH INSURANCE | Admitting: Family Medicine

## 2017-06-25 VITALS — BP 124/82 | HR 97 | Temp 98.7°F | Ht 66.0 in | Wt 255.0 lb

## 2017-06-25 DIAGNOSIS — M25561 Pain in right knee: Secondary | ICD-10-CM | POA: Diagnosis not present

## 2017-06-25 DIAGNOSIS — R05 Cough: Secondary | ICD-10-CM

## 2017-06-25 DIAGNOSIS — J069 Acute upper respiratory infection, unspecified: Secondary | ICD-10-CM

## 2017-06-25 DIAGNOSIS — R059 Cough, unspecified: Secondary | ICD-10-CM

## 2017-06-25 MED ORDER — ALBUTEROL SULFATE HFA 108 (90 BASE) MCG/ACT IN AERS: 2.0000 | INHALATION_SPRAY | Freq: Four times a day (QID) | RESPIRATORY_TRACT | 0 refills | Status: DC | PRN

## 2017-06-25 MED ORDER — CETIRIZINE HCL 10 MG PO TABS: 10.0000 mg | ORAL_TABLET | Freq: Every day | ORAL | 5 refills | Status: DC

## 2017-06-25 MED ORDER — MOMETASONE FUROATE 50 MCG/ACT NA SUSP: 2.0000 | Freq: Every day | NASAL | 1 refills | Status: DC

## 2017-06-25 MED ORDER — MELOXICAM 15 MG PO TABS: 15.0000 mg | ORAL_TABLET | Freq: Every day | ORAL | 0 refills | Status: DC

## 2017-06-25 MED ORDER — MONTELUKAST SODIUM 10 MG PO TABS: 10.0000 mg | ORAL_TABLET | Freq: Every day | ORAL | 0 refills | Status: DC

## 2017-06-25 NOTE — Patient Instructions (Signed)
Upper Respiratory Infection, Adult Most upper respiratory infections (URIs) are a viral infection of the air passages leading to the lungs. A URI affects the nose, throat, and upper air passages. The most common type of URI is nasopharyngitis and is typically referred to as "the common cold." URIs run their course and usually go away on their own. Most of the time, a URI does not require medical attention, but sometimes a bacterial infection in the upper airways can follow a viral infection. This is called a secondary infection. Sinus and middle ear infections are common types of secondary upper respiratory infections. Bacterial pneumonia can also complicate a URI. A URI can worsen asthma and chronic obstructive pulmonary disease (COPD). Sometimes, these complications can require emergency medical care and may be life threatening. What are the causes? Almost all URIs are caused by viruses. A virus is a type of germ and can spread from one person to another. What increases the risk? You may be at risk for a URI if:  You smoke.  You have chronic heart or lung disease.  You have a weakened defense (immune) system.  You are very young or very old.  You have nasal allergies or asthma.  You work in crowded or poorly ventilated areas.  You work in health care facilities or schools.  What are the signs or symptoms? Symptoms typically develop 2-3 days after you come in contact with a cold virus. Most viral URIs last 7-10 days. However, viral URIs from the influenza virus (flu virus) can last 14-18 days and are typically more severe. Symptoms may include:  Runny or stuffy (congested) nose.  Sneezing.  Cough.  Sore throat.  Headache.  Fatigue.  Fever.  Loss of appetite.  Pain in your forehead, behind your eyes, and over your cheekbones (sinus pain).  Muscle aches.  How is this diagnosed? Your health care provider may diagnose a URI by:  Physical exam.  Tests to check that your  symptoms are not due to another condition such as: ? Strep throat. ? Sinusitis. ? Pneumonia. ? Asthma.  How is this treated? A URI goes away on its own with time. It cannot be cured with medicines, but medicines may be prescribed or recommended to relieve symptoms. Medicines may help:  Reduce your fever.  Reduce your cough.  Relieve nasal congestion.  Follow these instructions at home:  Take medicines only as directed by your health care provider.  Gargle warm saltwater or take cough drops to comfort your throat as directed by your health care provider.  Use a warm mist humidifier or inhale steam from a shower to increase air moisture. This may make it easier to breathe.  Drink enough fluid to keep your urine clear or pale yellow.  Eat soups and other clear broths and maintain good nutrition.  Rest as needed.  Return to work when your temperature has returned to normal or as your health care provider advises. You may need to stay home longer to avoid infecting others. You can also use a face mask and careful hand washing to prevent spread of the virus.  Increase the usage of your inhaler if you have asthma.  Do not use any tobacco products, including cigarettes, chewing tobacco, or electronic cigarettes. If you need help quitting, ask your health care provider. How is this prevented? The best way to protect yourself from getting a cold is to practice good hygiene.  Avoid oral or hand contact with people with cold symptoms.  Wash your   hands often if contact occurs.  There is no clear evidence that vitamin C, vitamin E, echinacea, or exercise reduces the chance of developing a cold. However, it is always recommended to get plenty of rest, exercise, and practice good nutrition. Contact a health care provider if:  You are getting worse rather than better.  Your symptoms are not controlled by medicine.  You have chills.  You have worsening shortness of breath.  You have  brown or red mucus.  You have yellow or brown nasal discharge.  You have pain in your face, especially when you bend forward.  You have a fever.  You have swollen neck glands.  You have pain while swallowing.  You have white areas in the back of your throat. Get help right away if:  You have severe or persistent: ? Headache. ? Ear pain. ? Sinus pain. ? Chest pain.  You have chronic lung disease and any of the following: ? Wheezing. ? Prolonged cough. ? Coughing up blood. ? A change in your usual mucus.  You have a stiff neck.  You have changes in your: ? Vision. ? Hearing. ? Thinking. ? Mood. This information is not intended to replace advice given to you by your health care provider. Make sure you discuss any questions you have with your health care provider. Document Released: 02/13/2001 Document Revised: 04/22/2016 Document Reviewed: 11/25/2013 Elsevier Interactive Patient Education  2017 Elsevier Inc.  

## 2017-06-25 NOTE — Progress Notes (Signed)
Patient ID: Laurie Monroe, female    DOB: [birth year]  Age: 46 y.o. MRN: [id number]    Subjective:  Subjective  HPI Synda L Zahradnik presents for R knee pain esp after sitting for a period of time --- recently it has gotten worse.  No known injury.   Pt also c/o sneezing , cough and runny nose since Sunday night.  Pt taking her zyrtec and singulair but ran out of her nasonex.    Review of Systems  Constitutional: Positive for chills. Negative for fever.  HENT: Positive for congestion, postnasal drip, rhinorrhea, sinus pressure and sneezing. Negative for sinus pain.   Respiratory: Positive for cough. Negative for chest tightness, shortness of breath and wheezing.   Cardiovascular: Negative for chest pain, palpitations and leg swelling.  Musculoskeletal: Positive for arthralgias, gait problem and joint swelling.  Allergic/Immunologic: Negative for environmental allergies.    History Past Medical History:  Diagnosis Date  . Allergy   . Anemia   . Chicken pox   . Epilepsy ([place])    Hx of Epilepsy. No seizures in over 20 years  . GERD (gastroesophageal reflux disease)   . Leukocytosis 12/28/2015  . Seizures ([place])    last episode 20 years ago  . Thalassemia trait, alpha 12/28/2015  . Tricuspid regurgitation     She has a past surgical history that includes Dilation and curettage of uterus; Varicose vein surgery; Abdominal hysterectomy (N/A, 03/28/2016); and Bilateral salpingectomy (Bilateral, 03/28/2016).   Her family history includes Heart disease in her father; Heart disease (age of onset: 39) in her brother; Hypertension in her brother, father, and mother; Liver cancer in her father; Prostate cancer in her father; Stomach cancer in her father.She reports that she has never smoked. She has never used smokeless tobacco. She reports that she does not drink alcohol or use drugs.  Current Outpatient Prescriptions on File Prior to Visit  Medication Sig Dispense Refill  . ASPIRIN LOW DOSE 81 MG  EC tablet TAKE 1 TABLET(81 MG) BY MOUTH DAILY 409 tablet 1  . folic acid (FOLVITE) 1 MG tablet TAKE 2 TABLETS(2 MG) BY MOUTH DAILY 180 tablet 0  . furosemide (LASIX) 40 MG tablet TAKE 1 TABLET(40 MG) BY MOUTH DAILY 30 tablet 0  . MULTIPLE VITAMIN PO Take 1 tablet by mouth daily.     . promethazine-dextromethorphan (PROMETHAZINE-DM) 6.25-15 MG/5ML syrup Take 5 mLs by mouth daily as needed for cough.  0   No current facility-administered medications on file prior to visit.      Objective:  Objective  Physical Exam  Constitutional: She is oriented to person, place, and time. She appears well-developed and well-nourished.  HENT:  Right Ear: External ear normal.  Left Ear: External ear normal.  + PND + errythema  Eyes: Conjunctivae are normal. Right eye exhibits no discharge. Left eye exhibits no discharge.  Cardiovascular: Normal rate, regular rhythm and normal heart sounds.   No murmur heard. Pulmonary/Chest: Effort normal and breath sounds normal. No respiratory distress. She has no wheezes. She has no rales. She exhibits no tenderness.  Musculoskeletal: She exhibits edema and tenderness.       Right shoulder: She exhibits tenderness and swelling.       Right knee: She exhibits swelling. She exhibits no effusion. Tenderness found. Patellar tendon tenderness noted.  Lymphadenopathy:    She has no cervical adenopathy.  Neurological: She is alert and oriented to person, place, and time.  Nursing note and vitals reviewed.  BP 124/82  Pulse 97   Temp 98.7 F (37.1 C) (Oral)   Ht 5\' 6"  (1.676 m)   Wt 255 lb (115.7 kg)   LMP 03/28/2016 (Exact Date)   SpO2 97%   BMI 41.16 kg/m  Wt Readings from Last 3 Encounters:  06/25/17 255 lb (115.7 kg)  03/22/17 255 lb 6.4 oz (115.8 kg)  02/03/17 245 lb (111.1 kg)     Lab Results  Component Value Date   WBC 13.1 (H) 12/28/2016   HGB 13.1 12/28/2016   HCT 37.4 12/28/2016   PLT 359 12/28/2016   GLUCOSE 80 10/26/2016   CHOL 125  10/23/2016   TRIG 51.0 10/23/2016   HDL 53.60 10/23/2016   LDLCALC 62 10/23/2016   ALT 15 10/26/2016   AST 21 10/26/2016   NA 139 10/26/2016   K 4.3 10/26/2016   CL 101 10/26/2016   CREATININE 0.72 10/26/2016   BUN 12 10/26/2016   CO2 22 10/26/2016   TSH 1.031 04/18/2015   HGBA1C 5.2 10/07/2014   MICROALBUR 0.7 11/21/2015    Dg Chest 2 View  Result Date: 12/21/2016 CLINICAL DATA:  46 year old female with pulmonary hypertension, snoring. EXAM: CHEST  2 VIEW COMPARISON:  10/24/2014 and earlier. FINDINGS: Chronically somewhat low lung volumes. Mediastinal contours are stable and within normal limits. Visualized tracheal air column is within normal limits. No pneumothorax, pulmonary edema, pleural effusion or confluent pulmonary opacity. Negative visible bowel gas pattern. No acute osseous abnormality identified. IMPRESSION: No acute cardiopulmonary abnormality. Electronically Signed   By: Genevie Ann M.D.   On: 12/21/2016 16:13     Assessment & Plan:  Plan  I am having Ms. Pouliot start on meloxicam. I am also having her maintain her MULTIPLE VITAMIN PO, ASPIRIN LOW DOSE, promethazine-dextromethorphan, furosemide, and folic acid.  Meds ordered this encounter  Medications  . meloxicam (MOBIC) 15 MG tablet    Sig: Take 1 tablet (15 mg total) by mouth daily.    Dispense:  30 tablet    Refill:  0    Problem List Items Addressed This Visit      Unprioritized   URI (upper respiratory infection)    con't zyrtec and nasonex  rto prn       Other Visit Diagnoses    Acute pain of right knee    -  Primary   Relevant Medications   meloxicam (MOBIC) 15 MG tablet   Other Relevant Orders   DG Knee Complete 4 Views Right      Follow-up: Return if symptoms worsen or fail to improve.  [name], DO

## 2017-06-26 ENCOUNTER — Telehealth: Payer: Self-pay | Admitting: Family Medicine

## 2017-06-26 ENCOUNTER — Other Ambulatory Visit: Payer: Self-pay | Admitting: Family Medicine

## 2017-06-26 ENCOUNTER — Ambulatory Visit (INDEPENDENT_AMBULATORY_CARE_PROVIDER_SITE_OTHER)
Admission: RE | Admit: 2017-06-26 | Discharge: 2017-06-26 | Disposition: A | Discharge status: Home / Self Care | Payer: PRIVATE HEALTH INSURANCE | Source: Ambulatory Visit | Attending: Family Medicine | Admitting: Family Medicine

## 2017-06-26 DIAGNOSIS — J069 Acute upper respiratory infection, unspecified: Secondary | ICD-10-CM | POA: Insufficient documentation

## 2017-06-26 DIAGNOSIS — M25561 Pain in right knee: Secondary | ICD-10-CM | POA: Diagnosis not present

## 2017-06-26 NOTE — Assessment & Plan Note (Signed)
con't zyrtec and nasonex  rto prn

## 2017-06-26 NOTE — Telephone Encounter (Signed)
Relation to PO:LIDC  Call back number: [phone] Pharmacy:  [facility] [place], [address] [place] [phone] (Phone) [phone] (Fax)     Reason for call:  Patient last seen 06/26/17 by Dr. Etter Sjogren and would like all medication prescribed please send  albuterol (PROVENTIL HFA;VENTOLIN HFA) 108 (90 Base) MCG/ACT inhaler  cetirizine (ZYRTEC) 10 MG tablet  meloxicam (MOBIC) 15 MG tablet  mometasone (NASONEX) 50 MCG/ACT nasal spray  montelukast (SINGULAIR) 10 MG tablet   To:  [facility] [place], [place] [address] [place] [phone] (Phone) [phone] (Fax)

## 2017-06-28 ENCOUNTER — Telehealth: Payer: Self-pay | Admitting: Family Medicine

## 2017-06-28 ENCOUNTER — Other Ambulatory Visit: Payer: Self-pay | Admitting: Family Medicine

## 2017-06-28 MED ORDER — DICLOFENAC SODIUM 1 % TD GEL: 4.0000 g | Freq: Four times a day (QID) | TRANSDERMAL | 2 refills | Status: DC

## 2017-06-28 NOTE — Telephone Encounter (Signed)
Relation to QV:[facility] Call back [phone] Pharmacy: [facility], [place]  Reason for call:  Patient states she spoke with a nurse regarding her imaging results yesterday 06/27/17 and she requested a cream for her knee,patient would like to know the status, please advise

## 2017-06-28 NOTE — Telephone Encounter (Signed)
Have called the patient two times today regarding this message--left message to call us back. See result notes for PCP instructions on cream

## 2017-06-28 NOTE — Telephone Encounter (Signed)
Patient did call me back and have informed her of cream instructions per PCP instructions on her recent imaging result. She verbalized understanding.

## 2017-07-01 ENCOUNTER — Other Ambulatory Visit: Payer: Self-pay

## 2017-07-03 ENCOUNTER — Other Ambulatory Visit: Payer: Self-pay

## 2017-08-04 ENCOUNTER — Other Ambulatory Visit: Payer: Self-pay | Admitting: Family Medicine

## 2017-08-04 DIAGNOSIS — R600 Localized edema: Secondary | ICD-10-CM

## 2017-09-06 ENCOUNTER — Other Ambulatory Visit: Payer: Self-pay | Admitting: Family

## 2017-09-06 ENCOUNTER — Other Ambulatory Visit: Payer: Self-pay | Admitting: Family Medicine

## 2017-09-06 DIAGNOSIS — R600 Localized edema: Secondary | ICD-10-CM

## 2017-09-06 DIAGNOSIS — D582 Other hemoglobinopathies: Secondary | ICD-10-CM

## 2017-09-06 DIAGNOSIS — D563 Thalassemia minor: Secondary | ICD-10-CM

## 2017-10-02 ENCOUNTER — Other Ambulatory Visit: Payer: Self-pay | Admitting: Family Medicine

## 2017-12-02 ENCOUNTER — Telehealth: Payer: Self-pay | Admitting: Family Medicine

## 2017-12-02 NOTE — Telephone Encounter (Signed)
Copied from [facility] [phone]. Topic: Quick Communication - Rx Refill/Question >> Dec 02, 2017  5:53 PM Neva Seat wrote: Promethazine DM Syurp  Pt needing refill.  If there are any questions call pt.  [facility] [phone] [address], [address] [address] [place] [address] Phone: [phone] Fax: [phone]

## 2017-12-03 NOTE — Telephone Encounter (Signed)
Patient called, left VM to return call to the office with the symptoms she's having as to why the cough syrup is needed, it was last filled in January, 2018, she will need to be seen in the office.

## 2017-12-03 NOTE — Telephone Encounter (Signed)
Patient called, left VM to return call to the office to discuss symptoms needing the promethazine cough syrup.

## 2017-12-19 ENCOUNTER — Other Ambulatory Visit: Payer: Self-pay | Admitting: Family

## 2017-12-19 DIAGNOSIS — D563 Thalassemia minor: Secondary | ICD-10-CM

## 2017-12-19 DIAGNOSIS — D582 Other hemoglobinopathies: Secondary | ICD-10-CM

## 2018-01-07 ENCOUNTER — Ambulatory Visit: Payer: PRIVATE HEALTH INSURANCE | Admitting: Family Medicine

## 2018-01-07 ENCOUNTER — Encounter: Payer: Self-pay | Admitting: Family Medicine

## 2018-01-07 VITALS — BP 123/67 | HR 95 | Temp 98.9°F | Resp 16 | Wt 254.8 lb

## 2018-01-07 DIAGNOSIS — J302 Other seasonal allergic rhinitis: Secondary | ICD-10-CM

## 2018-01-07 DIAGNOSIS — J40 Bronchitis, not specified as acute or chronic: Secondary | ICD-10-CM

## 2018-01-07 MED ORDER — MONTELUKAST SODIUM 10 MG PO TABS: 10.0000 mg | ORAL_TABLET | Freq: Every day | ORAL | 0 refills | Status: DC

## 2018-01-07 MED ORDER — PROMETHAZINE-DM 6.25-15 MG/5ML PO SYRP: 5.0000 mL | ORAL_SOLUTION | Freq: Every day | ORAL | 0 refills | Status: DC | PRN

## 2018-01-07 MED ORDER — AZITHROMYCIN 250 MG PO TABS: ORAL_TABLET | ORAL | 0 refills | Status: DC

## 2018-01-07 NOTE — Progress Notes (Signed)
Subjective:  .I acted as a Education administrator for [employer]. Yancey Flemings, [place]   Patient ID: Laurie Monroe, female    DOB: April 08, 1971, 47 y.o.   MRN: [id number]  Chief Complaint  Patient presents with  . Cough    HPI  Patient is in today for cough x weeks.  Productive--  Has not used her inhaler but feels like she should No fever Patient Care Team: Carollee Herter, Alferd Apa, DO as PCP - General (Family Medicine) Meisinger, Sherren Mocha, MD as Consulting Physician (Obstetrics and Gynecology)   Past Medical History:  Diagnosis Date  . Allergy   . Anemia   . Chicken pox   . Epilepsy ([place])    Hx of Epilepsy. No seizures in over 20 years  . GERD (gastroesophageal reflux disease)   . Leukocytosis 12/28/2015  . Seizures ([place])    last episode 20 years ago  . Thalassemia trait, alpha 12/28/2015  . Tricuspid regurgitation     Past Surgical History:  Procedure Laterality Date  . ABDOMINAL HYSTERECTOMY N/A 03/28/2016   Procedure: HYSTERECTOMY ABDOMINAL;  Surgeon: Cheri Fowler, MD;  Location: [facility];  Service: Gynecology;  Laterality: N/A;  . BILATERAL SALPINGECTOMY Bilateral 03/28/2016   Procedure: BILATERAL SALPINGECTOMY;  Surgeon: Cheri Fowler, MD;  Location: [facility];  Service: Gynecology;  Laterality: Bilateral;  . DILATION AND CURETTAGE OF UTERUS     Heavy Periods  . VARICOSE VEIN SURGERY      Family History  Problem Relation Age of Onset  . Hypertension Mother   . Prostate cancer Father   . Hypertension Father   . Liver cancer Father   . Stomach cancer Father   . Heart disease Father   . Hypertension Brother        Twin Brother  . Heart disease Brother 1       coronary stent    Social History   Socioeconomic History  . Marital status: Single    Spouse name: Not on file  . Number of children: Not on file  . Years of education: Not on file  . Highest education level: Not on file  Occupational History  . Occupation: Forensic psychologist: [employer]  . Financial  resource strain: Not on file  . Food insecurity:    Worry: Not on file    Inability: Not on file  . Transportation needs:    Medical: Not on file    Non-medical: Not on file  Tobacco Use  . Smoking status: Never Smoker  . Smokeless tobacco: Never Used  Substance and Sexual Activity  . Alcohol use: No    Alcohol/week: 0.0 oz  . Drug use: No  . Sexual activity: Yes    Birth control/protection: None  Lifestyle  . Physical activity:    Days per week: Not on file    Minutes per session: Not on file  . Stress: Not on file  Relationships  . Social connections:    Talks on phone: Not on file    Gets together: Not on file    Attends religious service: Not on file    Active member of club or organization: Not on file    Attends meetings of clubs or organizations: Not on file    Relationship status: Not on file  . Intimate partner violence:    Fear of current or ex partner: Not on file    Emotionally abused: Not on file    Physically abused: Not on file  Forced sexual activity: Not on file  Other Topics Concern  . Not on file  Social History Narrative  . Not on file    Outpatient Medications Prior to Visit  Medication Sig Dispense Refill  . albuterol (PROVENTIL HFA;VENTOLIN HFA) 108 (90 Base) MCG/ACT inhaler Inhale 2 puffs into the lungs every 6 (six) hours as needed for wheezing or shortness of breath. 1 Inhaler 0  . ASPIRIN LOW DOSE 81 MG EC tablet TAKE 1 TABLET(81 MG) BY MOUTH DAILY 100 tablet 0  . cetirizine (ZYRTEC) 10 MG tablet Take 1 tablet (10 mg total) by mouth daily. 30 tablet 5  . diclofenac sodium (VOLTAREN) 1 % GEL Apply 4 g topically 4 (four) times daily. 382 g 2  . folic acid (FOLVITE) 1 MG tablet TAKE 2 TABLETS(2 MG) BY MOUTH DAILY 180 tablet 0  . furosemide (LASIX) 40 MG tablet TAKE 1 TABLET(40 MG) BY MOUTH DAILY 90 tablet 1  . meloxicam (MOBIC) 15 MG tablet TAKE 1 TABLET BY MOUTH EVERY DAY 90 tablet 1  . mometasone (NASONEX) 50 MCG/ACT nasal spray Place 2  sprays into the nose at bedtime. 17 g 1  . MULTIPLE VITAMIN PO Take 1 tablet by mouth daily.     . montelukast (SINGULAIR) 10 MG tablet TAKE 1 TABLET BY MOUTH EVERY NIGHT AT BEDTIME 30 tablet 0  . promethazine-dextromethorphan (PROMETHAZINE-DM) 6.25-15 MG/5ML syrup Take 5 mLs by mouth daily as needed for cough.  0   No facility-administered medications prior to visit.     No Known Allergies  Review of Systems  Constitutional: Negative for fever and malaise/fatigue.  HENT: Negative for congestion.   Eyes: Negative for blurred vision.  Respiratory: Positive for cough, sputum production and wheezing. Negative for shortness of breath.   Cardiovascular: Negative for chest pain, palpitations and leg swelling.  Gastrointestinal: Negative for vomiting.  Musculoskeletal: Negative for back pain.  Skin: Negative for rash.  Neurological: Negative for loss of consciousness and headaches.  Psychiatric/Behavioral: Negative.        Objective:    Physical Exam  Constitutional: She is oriented to person, place, and time. She appears well-developed and well-nourished.  HENT:  Right Ear: External ear normal.  Left Ear: External ear normal.  + PND + errythema  Eyes: Conjunctivae are normal. Right eye exhibits no discharge. Left eye exhibits no discharge.  Cardiovascular: Normal rate, regular rhythm and normal heart sounds.  No murmur heard. Pulmonary/Chest: Effort normal. No respiratory distress. She has no wheezes. She has rhonchi. She has no rales. She exhibits no tenderness.  Musculoskeletal: She exhibits no edema.  Lymphadenopathy:    She has cervical adenopathy.  Neurological: She is alert and oriented to person, place, and time.  Nursing note and vitals reviewed.   BP 123/67 (BP Location: Left Arm, Patient Position: Sitting, Cuff Size: Large)   Pulse 95   Temp 98.9 F (37.2 C) (Oral)   Resp 16   Wt 254 lb 12.8 oz (115.6 kg)   LMP 03/28/2016 (Exact Date)   SpO2 97%   BMI 41.13  kg/m  Wt Readings from Last 3 Encounters:  01/07/18 254 lb 12.8 oz (115.6 kg)  06/25/17 255 lb (115.7 kg)  03/22/17 255 lb 6.4 oz (115.8 kg)   BP Readings from Last 3 Encounters:  01/07/18 123/67  06/25/17 124/82  03/22/17 116/80     Immunization History  Administered Date(s) Administered  . Influenza-Unspecified 06/04/2015, 06/21/2016  . Tdap 07/03/2015    Health Maintenance  Topic Date Due  .  HIV Screening  03/11/1988  . MAMMOGRAM  12/02/2016  . INFLUENZA VACCINE  04/03/2018  . PAP SMEAR  02/05/2020  . TETANUS/TDAP  07/02/2025    Lab Results  Component Value Date   WBC 13.1 (H) 12/28/2016   HGB 13.1 12/28/2016   HCT 37.4 12/28/2016   PLT 359 12/28/2016   GLUCOSE 80 10/26/2016   CHOL 125 10/23/2016   TRIG 51.0 10/23/2016   HDL 53.60 10/23/2016   LDLCALC 62 10/23/2016   ALT 15 10/26/2016   AST 21 10/26/2016   NA 139 10/26/2016   K 4.3 10/26/2016   CL 101 10/26/2016   CREATININE 0.72 10/26/2016   BUN 12 10/26/2016   CO2 22 10/26/2016   TSH 1.031 04/18/2015   HGBA1C 5.2 10/07/2014   MICROALBUR 0.7 11/21/2015    Lab Results  Component Value Date   TSH 1.031 04/18/2015   Lab Results  Component Value Date   WBC 13.1 (H) 12/28/2016   HGB 13.1 12/28/2016   HCT 37.4 12/28/2016   MCV 62 (L) 12/28/2016   PLT 359 12/28/2016   Lab Results  Component Value Date   NA 139 10/26/2016   K 4.3 10/26/2016   CO2 22 10/26/2016   GLUCOSE 80 10/26/2016   BUN 12 10/26/2016   CREATININE 0.72 10/26/2016   BILITOT 0.3 10/26/2016   ALKPHOS 80 10/26/2016   AST 21 10/26/2016   ALT 15 10/26/2016   PROT 7.2 10/26/2016   ALBUMIN 4.1 10/26/2016   CALCIUM 9.1 10/26/2016   ANIONGAP 7 03/16/2016   GFR 117.11 10/23/2016   Lab Results  Component Value Date   CHOL 125 10/23/2016   Lab Results  Component Value Date   HDL 53.60 10/23/2016   Lab Results  Component Value Date   LDLCALC 62 10/23/2016   Lab Results  Component Value Date   TRIG 51.0 10/23/2016   Lab  Results  Component Value Date   CHOLHDL 2 10/23/2016   Lab Results  Component Value Date   HGBA1C 5.2 10/07/2014         Assessment & Plan:   Problem List Items Addressed This Visit    None    Visit Diagnoses    Seasonal allergies    -  Primary   Relevant Medications   promethazine-dextromethorphan (PROMETHAZINE-DM) 6.25-15 MG/5ML syrup   montelukast (SINGULAIR) 10 MG tablet   Bronchitis       Relevant Medications   azithromycin (ZITHROMAX Z-PAK) 250 MG tablet      I have changed Laurie Monroe's montelukast. I am also having her start on azithromycin. Additionally, I am having her maintain her MULTIPLE VITAMIN PO, cetirizine, mometasone, albuterol, meloxicam, diclofenac sodium, furosemide, ASPIRIN LOW DOSE, folic acid, and promethazine-dextromethorphan.  Meds ordered this encounter  Medications  . promethazine-dextromethorphan (PROMETHAZINE-DM) 6.25-15 MG/5ML syrup    Sig: Take 5 mLs by mouth daily as needed for cough.    Dispense:  118 mL    Refill:  0  . montelukast (SINGULAIR) 10 MG tablet    Sig: Take 1 tablet (10 mg total) by mouth at bedtime.    Dispense:  30 tablet    Refill:  0  . azithromycin (ZITHROMAX Z-PAK) 250 MG tablet    Sig: As directed    Dispense:  6 each    Refill:  0    CMA served as scribe during this visit. History, Physical and Plan performed by medical provider. Documentation and orders reviewed and attested to.  [name], DO

## 2018-01-07 NOTE — Patient Instructions (Signed)

## 2018-01-30 ENCOUNTER — Other Ambulatory Visit: Payer: Self-pay | Admitting: Family Medicine

## 2018-05-13 ENCOUNTER — Other Ambulatory Visit: Payer: Self-pay | Admitting: Family Medicine

## 2018-05-13 DIAGNOSIS — R05 Cough: Secondary | ICD-10-CM

## 2018-05-13 DIAGNOSIS — J302 Other seasonal allergic rhinitis: Secondary | ICD-10-CM

## 2018-05-13 DIAGNOSIS — R059 Cough, unspecified: Secondary | ICD-10-CM

## 2018-05-14 NOTE — Telephone Encounter (Signed)
Do you want her to come in for an ov for this?

## 2018-05-19 ENCOUNTER — Encounter: Payer: Self-pay | Admitting: Family

## 2018-05-19 ENCOUNTER — Ambulatory Visit: Payer: PRIVATE HEALTH INSURANCE | Admitting: Family

## 2018-05-19 VITALS — BP 122/80 | HR 86 | Temp 99.5°F | Resp 16 | Ht 66.0 in | Wt 260.0 lb

## 2018-05-19 DIAGNOSIS — J40 Bronchitis, not specified as acute or chronic: Secondary | ICD-10-CM

## 2018-05-19 DIAGNOSIS — J302 Other seasonal allergic rhinitis: Secondary | ICD-10-CM

## 2018-05-19 MED ORDER — AZITHROMYCIN 250 MG PO TABS: ORAL_TABLET | ORAL | 0 refills | Status: DC

## 2018-05-19 MED ORDER — PROMETHAZINE-DM 6.25-15 MG/5ML PO SYRP: 5.0000 mL | ORAL_SOLUTION | Freq: Every day | ORAL | 0 refills | Status: DC | PRN

## 2018-05-19 NOTE — Progress Notes (Signed)
Subjective:    Patient ID: Laurie Monroe, female    DOB: 02/26/1971, 47 y.o.   MRN: [id number]  HPI  Patient is a 47 yr old female who presents today with chief complaint of cough. Cough as been present x 2 weeks. Cough is worse at night and has been wheezing. Has albuterol inhaler which she has been using.  Reports that this does help her symptoms.  She denies known fever.     Review of Systems See HPI  Past Medical History:  Diagnosis Date  . Allergy   . Anemia   . Chicken pox   . Epilepsy ([place])    Hx of Epilepsy. No seizures in over 20 years  . GERD (gastroesophageal reflux disease)   . Leukocytosis 12/28/2015  . Seizures ([place])    last episode 20 years ago  . Thalassemia trait, alpha 12/28/2015  . Tricuspid regurgitation      Social History   Socioeconomic History  . Marital status: Single    Spouse name: Not on file  . Number of children: Not on file  . Years of education: Not on file  . Highest education level: Not on file  Occupational History  . 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 Financial resource strain: Not on file  . Food insecurity:    Worry: Not on file    Inability: Not on file  . Transportation needs:    Medical: Not on file    Non-medical: Not on file  Tobacco Use  . Smoking status: Never Smoker  . Smokeless tobacco: Never Used  Substance and Sexual Activity  . Alcohol use: No    Alcohol/week: 0.0 standard drinks  . Drug use: No  . Sexual activity: Yes    Birth control/protection: None  Lifestyle  . Physical activity:    Days per week: Not on file    Minutes per session: Not on file  . Stress: Not on file  Relationships  . Social connections:    Talks on phone: Not on file    Gets together: Not on file    Attends religious service: Not on file    Active member of club or organization: Not on file    Attends meetings of clubs or organizations: Not on file    Relationship status: Not on file  . Intimate partner  violence:    Fear of current or ex partner: Not on file    Emotionally abused: Not on file    Physically abused: Not on file    Forced sexual activity: Not on file  Other Topics Concern  . Not on file  Social History Narrative  . Not on file    Past Surgical History:  Procedure Laterality Date  . ABDOMINAL HYSTERECTOMY N/A 03/28/2016   Procedure: HYSTERECTOMY ABDOMINAL;  Surgeon: Cheri Fowler, MD;  Location: [facility];  Service: Gynecology;  Laterality: N/A;  . BILATERAL SALPINGECTOMY Bilateral 03/28/2016   Procedure: BILATERAL SALPINGECTOMY;  Surgeon: Cheri Fowler, MD;  Location: [facility];  Service: Gynecology;  Laterality: Bilateral;  . DILATION AND CURETTAGE OF UTERUS     Heavy Periods  . VARICOSE VEIN SURGERY      Family History  Problem Relation Age of Onset  . Hypertension Mother   . Prostate cancer Father   . Hypertension Father   . Liver cancer Father   . Stomach cancer Father   . Heart disease Father   . Hypertension Brother  Twin Brother  . Heart disease Brother 16       coronary stent    No Known Allergies  Current Outpatient Medications on File Prior to Visit  Medication Sig Dispense Refill  . albuterol (PROVENTIL HFA;VENTOLIN HFA) 108 (90 Base) MCG/ACT inhaler INHALE 2 PUFFS EVERY 6 HOURS AS NEEDED FOR WHEEZING OR SHORTNESS OF BREATH 8.5 g 1  . aspirin 81 MG EC tablet TAKE 1 TABLET(81 MG) BY MOUTH DAILY 100 tablet 1  . cetirizine (ZYRTEC) 10 MG tablet TAKE 1 TABLET BY MOUTH EVERY DAY 30 tablet 1  . diclofenac sodium (VOLTAREN) 1 % GEL Apply 4 g topically 4 (four) times daily. 765 g 2  . folic acid (FOLVITE) 1 MG tablet TAKE 2 TABLETS(2 MG) BY MOUTH DAILY 180 tablet 0  . furosemide (LASIX) 40 MG tablet TAKE 1 TABLET(40 MG) BY MOUTH DAILY 90 tablet 1  . mometasone (NASONEX) 50 MCG/ACT nasal spray Place 2 sprays into the nose at bedtime. 17 g 1  . montelukast (SINGULAIR) 10 MG tablet Take 1 tablet (10 mg total) by mouth at bedtime. 30 tablet 0  . MULTIPLE  VITAMIN PO Take 1 tablet by mouth daily.     . promethazine-dextromethorphan (PROMETHAZINE-DM) 6.25-15 MG/5ML syrup Take 5 mLs by mouth daily as needed for cough. 118 mL 0   No current facility-administered medications on file prior to visit.     BP 122/80 (BP Location: Right Arm, Patient Position: Sitting, Cuff Size: Large)   Pulse 86   Temp 99.5 F (37.5 C) (Oral)   Resp 16   Ht 5\' 6"  (1.676 m)   Wt 260 lb (117.9 kg)   LMP 03/28/2016 (Exact Date)   SpO2 96%   BMI 41.97 kg/m       Objective:   Physical Exam  Constitutional: She is oriented to person, place, and time. She appears well-developed and well-nourished.  HENT:  Head: Normocephalic and atraumatic.  Right Ear: Tympanic membrane and ear canal normal.  Left Ear: Tympanic membrane and ear canal normal.  Mouth/Throat: No oropharyngeal exudate, posterior oropharyngeal edema or posterior oropharyngeal erythema.  Cardiovascular: Normal rate, regular rhythm and normal heart sounds.  No murmur heard. Pulmonary/Chest: Effort normal and breath sounds normal. No respiratory distress. She has no wheezes.  Musculoskeletal: She exhibits no edema.  Neurological: She is alert and oriented to person, place, and time.  Skin: Skin is warm and dry.  Psychiatric: She has a normal mood and affect. Her behavior is normal. Judgment and thought content normal.          Assessment & Plan:  Bronchitis-Temperature today is mildly elevated at 99.5. given duration of symptoms we will go ahead and treat empirically with a azithromycin.  She is advised to use albuterol as needed for wheezing and promethazine DM cough syrup as needed for cough.  She is advised to call if symptoms worsen or if they fail to improve the next 3 to 5 days.  Patient verbalizes understanding.

## 2018-05-19 NOTE — Patient Instructions (Signed)
Please begin azithromycin for bronchititis. Continue albuterol as needed for wheezing. You may use promethazine cough syrup as needed for cough. Call if new/worsening symptoms or if not improved in 3-5 days.

## 2018-08-28 ENCOUNTER — Other Ambulatory Visit: Payer: Self-pay | Admitting: Family Medicine

## 2018-08-28 ENCOUNTER — Encounter: Payer: Self-pay | Admitting: Family Medicine

## 2018-08-28 ENCOUNTER — Ambulatory Visit: Payer: PRIVATE HEALTH INSURANCE | Admitting: Family Medicine

## 2018-08-28 VITALS — BP 128/76 | HR 89 | Temp 97.3°F | Ht 66.0 in | Wt 254.4 lb

## 2018-08-28 DIAGNOSIS — J302 Other seasonal allergic rhinitis: Secondary | ICD-10-CM

## 2018-08-28 DIAGNOSIS — J45909 Unspecified asthma, uncomplicated: Secondary | ICD-10-CM | POA: Insufficient documentation

## 2018-08-28 DIAGNOSIS — J069 Acute upper respiratory infection, unspecified: Secondary | ICD-10-CM | POA: Diagnosis not present

## 2018-08-28 DIAGNOSIS — J4521 Mild intermittent asthma with (acute) exacerbation: Secondary | ICD-10-CM | POA: Diagnosis not present

## 2018-08-28 MED ORDER — PREDNISONE 10 MG PO TABS: 10.0000 mg | ORAL_TABLET | Freq: Two times a day (BID) | ORAL | 0 refills | Status: AC

## 2018-08-28 MED ORDER — PROMETHAZINE-DM 6.25-15 MG/5ML PO SYRP: 5.0000 mL | ORAL_SOLUTION | Freq: Four times a day (QID) | ORAL | 0 refills | Status: DC | PRN

## 2018-08-28 NOTE — Progress Notes (Signed)
Established Patient Office Visit  Subjective:  Patient ID: Laurie Monroe, female    DOB: [birth year]  Age: 47 y.o. MRN: [id number]  CC:  Chief Complaint  Patient presents with  . Cough  . Nasal Congestion    HPI Laurie Monroe presents for treatment and evaluation of a 2-day history of URI symptoms to include nasal congestion postnasal drip cough with wheezing productive of some phlegm.  Patient has no asthma history but does wheeze sometimes with illness.  She has been wheezing more in the last few days and has been reaching for her inhaler.  She has run no fever chills nausea and vomiting.  There is been no facial pressure teeth pain or ear congestion.  She does not smoke.  She does work in an assisted living facility.  She has ongoing allergy issues.  For this she takes Zyrtec and Singulair.  Past Medical History:  Diagnosis Date  . Allergy   . Anemia   . Chicken pox   . Epilepsy ([place])    Hx of Epilepsy. No seizures in over 20 years  . GERD (gastroesophageal reflux disease)   . Leukocytosis 12/28/2015  . Seizures ([place])    last episode 20 years ago  . Thalassemia trait, alpha 12/28/2015  . Tricuspid regurgitation     Past Surgical History:  Procedure Laterality Date  . ABDOMINAL HYSTERECTOMY N/A 03/28/2016   Procedure: HYSTERECTOMY ABDOMINAL;  Surgeon: Cheri Fowler, MD;  Location: [facility];  Service: Gynecology;  Laterality: N/A;  . BILATERAL SALPINGECTOMY Bilateral 03/28/2016   Procedure: BILATERAL SALPINGECTOMY;  Surgeon: Cheri Fowler, MD;  Location: [facility];  Service: Gynecology;  Laterality: Bilateral;  . DILATION AND CURETTAGE OF UTERUS     Heavy Periods  . VARICOSE VEIN SURGERY      Family History  Problem Relation Age of Onset  . Hypertension Mother   . Prostate cancer Father   . Hypertension Father   . Liver cancer Father   . Stomach cancer Father   . Heart disease Father   . Hypertension Brother        Twin Brother  . Heart disease Brother 80   coronary stent    Social History   Socioeconomic History  . Marital status: Single    Spouse name: Not on file  . Number of children: Not on file  . Years of education: Not on file  . Highest education level: Not on file  Occupational History  . 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 Financial resource strain: Not on file  . Food insecurity:    Worry: Not on file    Inability: Not on file  . Transportation needs:    Medical: Not on file    Non-medical: Not on file  Tobacco Use  . Smoking status: Never Smoker  . Smokeless tobacco: Never Used  Substance and Sexual Activity  . Alcohol use: No    Alcohol/week: 0.0 standard drinks  . Drug use: No  . Sexual activity: Yes    Birth control/protection: None  Lifestyle  . Physical activity:    Days per week: Not on file    Minutes per session: Not on file  . Stress: Not on file  Relationships  . Social connections:    Talks on phone: Not on file    Gets together: Not on file    Attends religious service: Not on file    Active member of club or organization: Not on  file    Attends meetings of clubs or organizations: Not on file    Relationship status: Not on file  . Intimate partner violence:    Fear of current or ex partner: Not on file    Emotionally abused: Not on file    Physically abused: Not on file    Forced sexual activity: Not on file  Other Topics Concern  . Not on file  Social History Narrative  . Not on file    Outpatient Medications Prior to Visit  Medication Sig Dispense Refill  . albuterol (PROVENTIL HFA;VENTOLIN HFA) 108 (90 Base) MCG/ACT inhaler INHALE 2 PUFFS EVERY 6 HOURS AS NEEDED FOR WHEEZING OR SHORTNESS OF BREATH 8.5 g 1  . aspirin 81 MG EC tablet TAKE 1 TABLET(81 MG) BY MOUTH DAILY 100 tablet 1  . cetirizine (ZYRTEC) 10 MG tablet TAKE 1 TABLET BY MOUTH EVERY DAY 30 tablet 1  . diclofenac sodium (VOLTAREN) 1 % GEL Apply 4 g topically 4 (four) times daily. 130 g 2  . folic  acid (FOLVITE) 1 MG tablet TAKE 2 TABLETS(2 MG) BY MOUTH DAILY 180 tablet 0  . furosemide (LASIX) 40 MG tablet TAKE 1 TABLET(40 MG) BY MOUTH DAILY 90 tablet 1  . mometasone (NASONEX) 50 MCG/ACT nasal spray Place 2 sprays into the nose at bedtime. 17 g 1  . montelukast (SINGULAIR) 10 MG tablet Take 1 tablet (10 mg total) by mouth at bedtime. 30 tablet 0  . MULTIPLE VITAMIN PO Take 1 tablet by mouth daily.     Marland Kitchen azithromycin (ZITHROMAX) 250 MG tablet 2 tabs by mouth today, then one tab once daily for 5 more days. 6 tablet 0  . promethazine-dextromethorphan (PROMETHAZINE-DM) 6.25-15 MG/5ML syrup Take 5 mLs by mouth daily as needed for cough. 118 mL 0   No facility-administered medications prior to visit.     No Known Allergies  ROS Review of Systems  Constitutional: Negative for chills, fatigue, fever and unexpected weight change.  HENT: Positive for congestion, postnasal drip and rhinorrhea. Negative for dental problem, ear pain, hearing loss, sinus pressure and sinus pain.   Eyes: Negative for photophobia and visual disturbance.  Respiratory: Positive for cough and wheezing. Negative for chest tightness and shortness of breath.   Cardiovascular: Negative.   Gastrointestinal: Negative.   Endocrine: Negative for polyphagia and polyuria.  Genitourinary: Negative.   Musculoskeletal: Negative for arthralgias and myalgias.  Allergic/Immunologic: Negative for immunocompromised state.  Neurological: Negative for weakness and headaches.  Hematological: Does not bruise/bleed easily.  Psychiatric/Behavioral: Negative.       Objective:    Physical Exam  Constitutional: She is oriented to person, place, and time. She appears well-developed and well-nourished. No distress.  HENT:  Head: Normocephalic and atraumatic.  Right Ear: External ear normal.  Left Ear: External ear normal.  Mouth/Throat: Oropharynx is clear and moist. No oropharyngeal exudate.  Eyes: Pupils are equal, round, and  reactive to light. Conjunctivae are normal. Right eye exhibits no discharge. Left eye exhibits no discharge. No scleral icterus.  Neck: Neck supple. No JVD present. No tracheal deviation present. No thyromegaly present.  Cardiovascular: Normal rate, regular rhythm and normal heart sounds.  Pulmonary/Chest: Effort normal. No stridor. No respiratory distress. She has wheezes. She has no rales.  Abdominal: Bowel sounds are normal.  Lymphadenopathy:    She has no cervical adenopathy.  Neurological: She is alert and oriented to person, place, and time.  Skin: Skin is warm and dry. She is not diaphoretic.  Psychiatric:  She has a normal mood and affect. Her behavior is normal.    BP 128/76   Pulse 89   Temp (!) 97.3 F (36.3 C) (Oral)   Ht 5\' 6"  (1.676 m)   Wt 254 lb 6 oz (115.4 kg)   LMP 03/28/2016 (Exact Date)   SpO2 100%   BMI 41.06 kg/m  Wt Readings from Last 3 Encounters:  08/28/18 254 lb 6 oz (115.4 kg)  05/19/18 260 lb (117.9 kg)  01/07/18 254 lb 12.8 oz (115.6 kg)   BP Readings from Last 3 Encounters:  08/28/18 128/76  05/19/18 122/80  01/07/18 123/67   Guideline developer:  UpToDate (see UpToDate for funding source) Date Released: June 2014  Health Maintenance Due  Topic Date Due  . HIV Screening  03/11/1988  . MAMMOGRAM  12/02/2016  . INFLUENZA VACCINE  04/03/2018    There are no preventive care reminders to display for this patient.  Lab Results  Component Value Date   TSH 1.031 04/18/2015   Lab Results  Component Value Date   WBC 13.1 (H) 12/28/2016   HGB 13.1 12/28/2016   HCT 37.4 12/28/2016   MCV 62 (L) 12/28/2016   PLT 359 12/28/2016   Lab Results  Component Value Date   NA 139 10/26/2016   K 4.3 10/26/2016   CO2 22 10/26/2016   GLUCOSE 80 10/26/2016   BUN 12 10/26/2016   CREATININE 0.72 10/26/2016   BILITOT 0.3 10/26/2016   ALKPHOS 80 10/26/2016   AST 21 10/26/2016   ALT 15 10/26/2016   PROT 7.2 10/26/2016   ALBUMIN 4.1 10/26/2016    CALCIUM 9.1 10/26/2016   ANIONGAP 7 03/16/2016   GFR 117.11 10/23/2016   Lab Results  Component Value Date   CHOL 125 10/23/2016   Lab Results  Component Value Date   HDL 53.60 10/23/2016   Lab Results  Component Value Date   LDLCALC 62 10/23/2016   Lab Results  Component Value Date   TRIG 51.0 10/23/2016   Lab Results  Component Value Date   CHOLHDL 2 10/23/2016   Lab Results  Component Value Date   HGBA1C 5.2 10/07/2014      Assessment & Plan:   Problem List Items Addressed This Visit      Respiratory   Viral upper respiratory tract infection - Primary   Relevant Medications   promethazine-dextromethorphan (PROMETHAZINE-DM) 6.25-15 MG/5ML syrup   Reactive airway disease   Relevant Medications   predniSONE (DELTASONE) 10 MG tablet      Meds ordered this encounter  Medications  . promethazine-dextromethorphan (PROMETHAZINE-DM) 6.25-15 MG/5ML syrup    Sig: Take 5 mLs by mouth 4 (four) times daily as needed for cough.    Dispense:  180 mL    Refill:  0  . predniSONE (DELTASONE) 10 MG tablet    Sig: Take 1 tablet (10 mg total) by mouth 2 (two) times daily with a meal for 5 days.    Dispense:  10 tablet    Refill:  0    Follow-up: Return in about 1 week (around 09/04/2018), or if symptoms worsen or fail to improve.

## 2018-08-28 NOTE — Patient Instructions (Signed)
Viral Respiratory Infection  A viral respiratory infection is an illness that affects parts of the body that are used for breathing. These include the lungs, nose, and throat. It is caused by a germ called a virus.  Some examples of this kind of infection are:  · A cold.  · The flu (influenza).  · A respiratory syncytial virus (RSV) infection.  A person who gets this illness may have the following symptoms:  · A stuffy or runny nose.  · Yellow or green fluid in the nose.  · A cough.  · Sneezing.  · Tiredness (fatigue).  · Achy muscles.  · A sore throat.  · Sweating or chills.  · A fever.  · A headache.  Follow these instructions at home:  Managing pain and congestion  · Take over-the-counter and prescription medicines only as told by your doctor.  · If you have a sore throat, gargle with salt water. Do this 3-4 times per day or as needed. To make a salt-water mixture, dissolve ½-1 tsp of salt in 1 cup of warm water. Make sure that all the salt dissolves.  · Use nose drops made from salt water. This helps with stuffiness (congestion). It also helps soften the skin around your nose.  · Drink enough fluid to keep your pee (urine) pale yellow.  General instructions    · Rest as much as possible.  · Do not drink alcohol.  · Do not use any products that have nicotine or tobacco, such as cigarettes and e-cigarettes. If you need help quitting, ask your doctor.  · Keep all follow-up visits as told by your doctor. This is important.  How is this prevented?    · Get a flu shot every year. Ask your doctor when you should get your flu shot.  · Do not let other people get your germs. If you are sick:  ? Stay home from work or school.  ? Wash your hands with soap and water often. Wash your hands after you cough or sneeze. If soap and water are not available, use hand sanitizer.  · Avoid contact with people who are sick during cold and flu season. This is in fall and winter.  Get help if:  · Your symptoms last for 10 days or  longer.  · Your symptoms get worse over time.  · You have a fever.  · You have very bad pain in your face or forehead.  · Parts of your jaw or neck become very swollen.  Get help right away if:  · You feel pain or pressure in your chest.  · You have shortness of breath.  · You faint or feel like you will faint.  · You keep throwing up (vomiting).  · You feel confused.  Summary  · A viral respiratory infection is an illness that affects parts of the body that are used for breathing.  · Examples of this illness include a cold, the flu, and respiratory syncytial virus (RSV) infection.  · The infection can cause a runny nose, cough, sneezing, sore throat, and fever.  · Follow what your doctor tells you about taking medicines, drinking lots of fluid, washing your hands, resting at home, and avoiding people who are sick.  This information is not intended to replace advice given to you by your health care provider. Make sure you discuss any questions you have with your health care provider.  Document Released: 08/02/2008 Document Revised: 09/30/2017 Document Reviewed: 09/30/2017  Elsevier   Interactive Patient Education © 2019 Elsevier Inc.

## 2018-12-04 ENCOUNTER — Other Ambulatory Visit: Payer: Self-pay | Admitting: Family Medicine

## 2018-12-09 ENCOUNTER — Other Ambulatory Visit: Payer: Self-pay | Admitting: Family Medicine

## 2018-12-09 DIAGNOSIS — J069 Acute upper respiratory infection, unspecified: Secondary | ICD-10-CM

## 2019-02-07 IMAGING — DX DG KNEE COMPLETE 4+V*R*
4 series · 4 of 4 positions shown · non-contrast
Comparison: None.

CLINICAL DATA: Right knee pain, no known injury, initial encounter

EXAM:
RIGHT KNEE - COMPLETE 4+ VIEW

[knee ap]
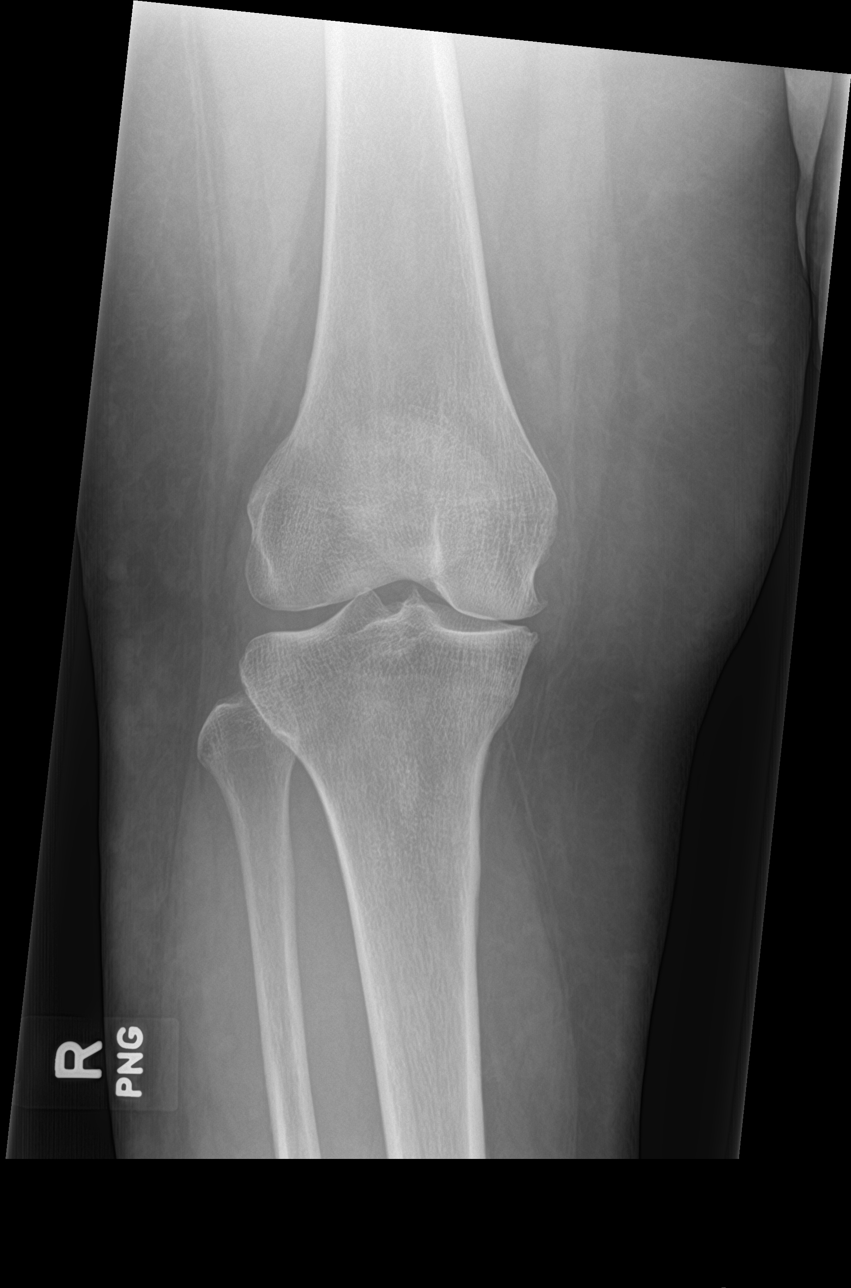

[knee tunnel]
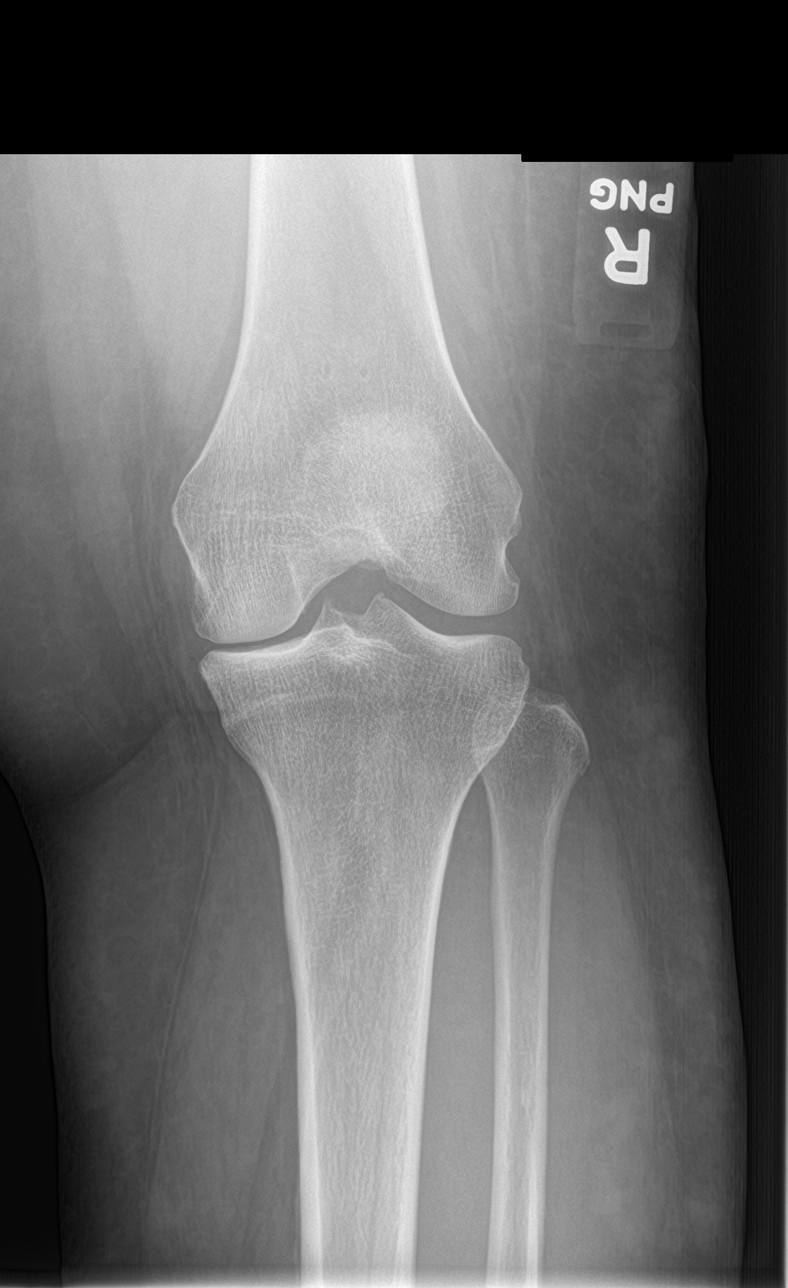

[knee lat]
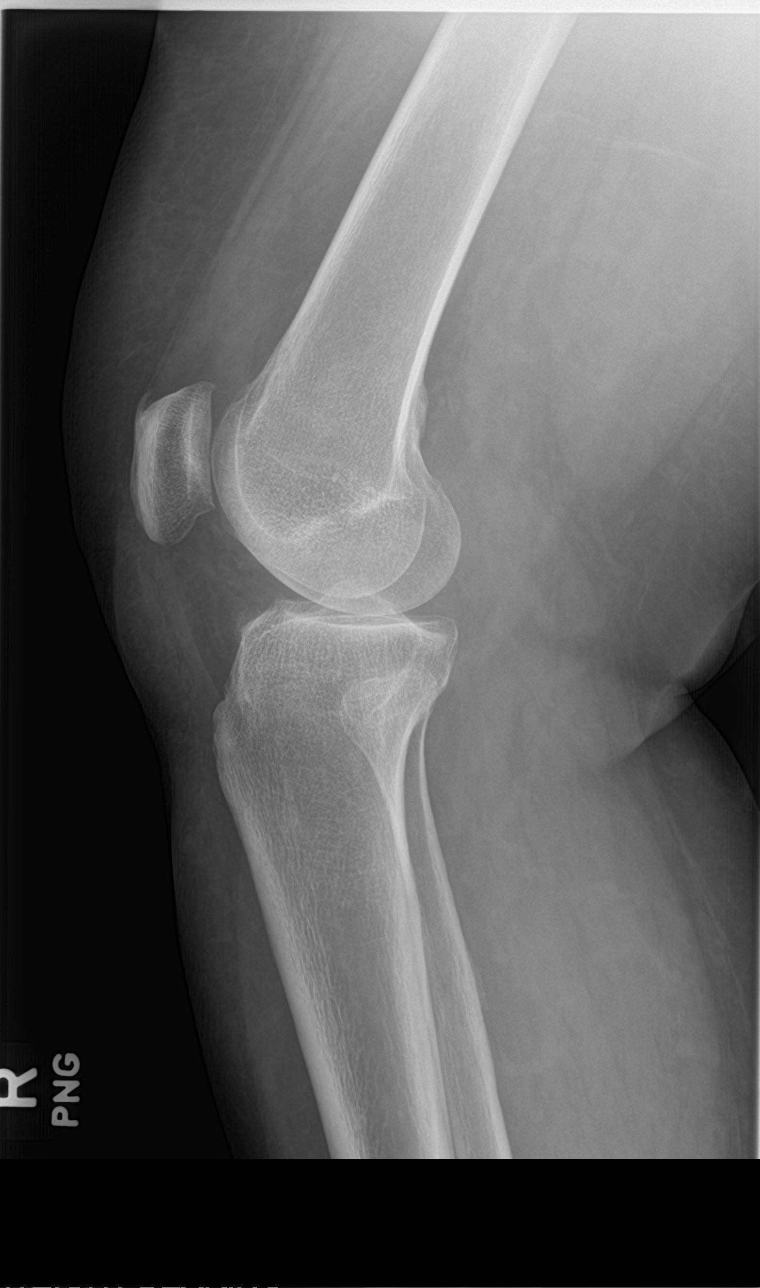

[sunrise]
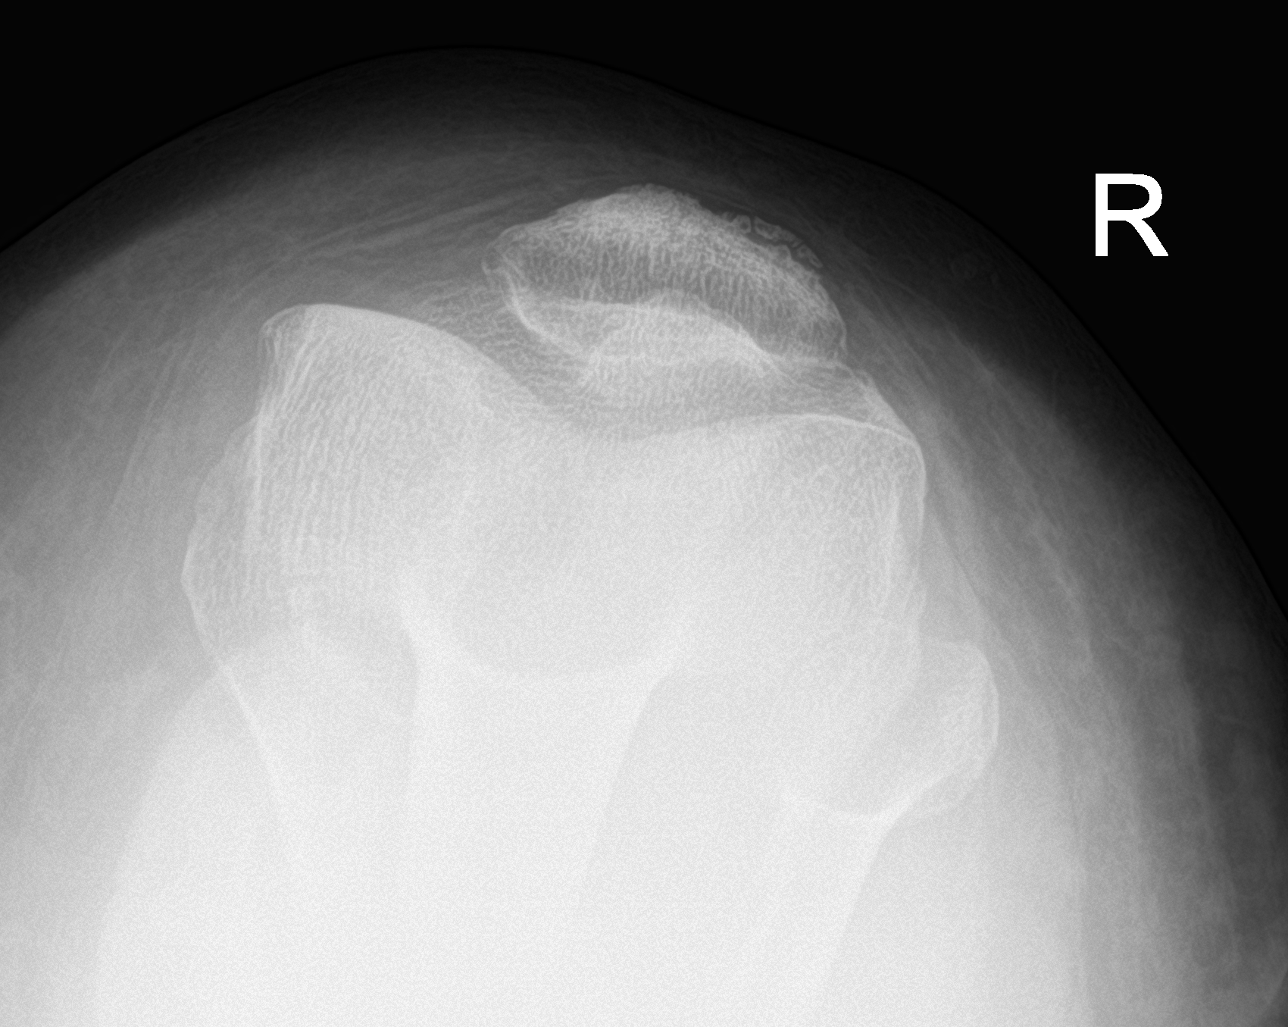

[4 of 4 positions shown; findings below may reference images not displayed]

FINDINGS: Medial joint space narrowing is noted with osteophytic change. No
acute fracture or dislocation is seen. No joint effusion is noted.
No soft tissue abnormality is seen.
IMPRESSION: Degenerative change without acute abnormality.

## 2019-04-28 ENCOUNTER — Ambulatory Visit (INDEPENDENT_AMBULATORY_CARE_PROVIDER_SITE_OTHER): Payer: PRIVATE HEALTH INSURANCE | Admitting: Family Medicine

## 2019-04-28 ENCOUNTER — Encounter: Payer: Self-pay | Admitting: Family Medicine

## 2019-04-28 ENCOUNTER — Other Ambulatory Visit: Payer: Self-pay

## 2019-04-28 DIAGNOSIS — Z20828 Contact with and (suspected) exposure to other viral communicable diseases: Secondary | ICD-10-CM

## 2019-04-28 DIAGNOSIS — Z20822 Contact with and (suspected) exposure to covid-19: Secondary | ICD-10-CM

## 2019-04-28 NOTE — Progress Notes (Signed)
Virtual Visit via Video Note  I connected with Laurie Monroe on 04/28/19 at 11:20 AM EDT by a video enabled telemedicine application and verified that I am speaking with the correct person using two identifiers.  Location: Patient: home  Provider: office    I discussed the limitations of evaluation and management by telemedicine and the availability of in person appointments. The patient expressed understanding and agreed to proceed.  History of Present Illness: The pt went to [facility] to see her mom who was sick.  She had a covid test done on Sat and it came back + today.  The patient has no symptoms.   Observations/Objective: No vitals obtained Pt in NAd   Assessment and Plan: 1. Close Exposure to Covid-19 Virus Work note done Pt to quarantine until results back  - Novel Coronavirus, NAA ([facility])   Follow Up Instructions:    I discussed the assessment and treatment plan with the patient. The patient was provided an opportunity to ask questions and all were answered. The patient agreed with the plan and demonstrated an understanding of the instructions.   The patient was advised to call back or seek an in-person evaluation if the symptoms worsen or if the condition fails to improve as anticipated.  I provided 10 minutes of non-face-to-face time during this encounter.   [name], DO

## 2019-05-22 ENCOUNTER — Other Ambulatory Visit: Payer: Self-pay | Admitting: Family Medicine

## 2019-05-22 DIAGNOSIS — J302 Other seasonal allergic rhinitis: Secondary | ICD-10-CM

## 2019-06-17 ENCOUNTER — Other Ambulatory Visit: Payer: Self-pay | Admitting: Family Medicine

## 2019-06-17 DIAGNOSIS — D563 Thalassemia minor: Secondary | ICD-10-CM

## 2019-06-17 DIAGNOSIS — D582 Other hemoglobinopathies: Secondary | ICD-10-CM

## 2019-09-24 ENCOUNTER — Ambulatory Visit (INDEPENDENT_AMBULATORY_CARE_PROVIDER_SITE_OTHER): Payer: PRIVATE HEALTH INSURANCE | Admitting: Family Medicine

## 2019-09-24 ENCOUNTER — Encounter: Payer: Self-pay | Admitting: Family Medicine

## 2019-09-24 ENCOUNTER — Other Ambulatory Visit: Payer: Self-pay

## 2019-09-24 DIAGNOSIS — J302 Other seasonal allergic rhinitis: Secondary | ICD-10-CM

## 2019-09-24 DIAGNOSIS — J40 Bronchitis, not specified as acute or chronic: Secondary | ICD-10-CM | POA: Diagnosis not present

## 2019-09-24 DIAGNOSIS — J069 Acute upper respiratory infection, unspecified: Secondary | ICD-10-CM | POA: Diagnosis not present

## 2019-09-24 MED ORDER — AZITHROMYCIN 250 MG PO TABS: ORAL_TABLET | ORAL | 0 refills | Status: DC

## 2019-09-24 MED ORDER — PROMETHAZINE-DM 6.25-15 MG/5ML PO SYRP: 5.0000 mL | ORAL_SOLUTION | Freq: Four times a day (QID) | ORAL | 0 refills | Status: DC | PRN

## 2019-09-24 MED ORDER — MONTELUKAST SODIUM 10 MG PO TABS: 10.0000 mg | ORAL_TABLET | Freq: Every day | ORAL | 3 refills | Status: DC

## 2019-09-24 MED ORDER — CETIRIZINE HCL 10 MG PO TABS: 10.0000 mg | ORAL_TABLET | Freq: Every day | ORAL | 1 refills | Status: DC

## 2019-09-24 NOTE — Progress Notes (Signed)
Virtual Visit via Video Note  I connected with Laurie Monroe on 09/24/19 at  3:00 PM EST by a video enabled telemedicine application and verified that I am speaking with the correct person using two identifiers.  Location: Patient: home Provider: office    I discussed the limitations of evaluation and management by telemedicine and the availability of in person appointments. The patient expressed understanding and agreed to proceed.  History of Present Illness: Pt is in her car c/o cough and sore throat x 2-3 weeks    Cough keeps her awake  No fever    Observations/Objective: There were no vitals filed for this visit. Pt in nad  Assessment and Plan: 1. Viral upper respiratory tract infection  - promethazine-dextromethorphan (PROMETHAZINE-DM) 6.25-15 MG/5ML syrup; Take 5 mLs by mouth 4 (four) times daily as needed for cough.  Dispense: 180 mL; Refill: 0  2. Seasonal allergies Refill meds , stable  - montelukast (SINGULAIR) 10 MG tablet; Take 1 tablet (10 mg total) by mouth at bedtime.  Dispense: 90 tablet; Refill: 3 - cetirizine (ZYRTEC) 10 MG tablet; Take 1 tablet (10 mg total) by mouth daily.  Dispense: 90 tablet; Refill: 1  3. Bronchitis z pack Cough meds  covid test to be done - azithromycin (ZITHROMAX Z-PAK) 250 MG tablet; As directed  Dispense: 6 each; Refill: 0   Follow Up Instructions:    I discussed the assessment and treatment plan with the patient. The patient was provided an opportunity to ask questions and all were answered. The patient agreed with the plan and demonstrated an understanding of the instructions.   The patient was advised to call back or seek an in-person evaluation if the symptoms worsen or if the condition fails to improve as anticipated.  I provided 20 minutes of non-face-to-face time during this encounter.   [name], DO

## 2019-10-01 ENCOUNTER — Ambulatory Visit: Payer: PRIVATE HEALTH INSURANCE | Admitting: Internal Medicine

## 2019-10-01 ENCOUNTER — Other Ambulatory Visit: Payer: Self-pay

## 2019-10-01 ENCOUNTER — Encounter: Payer: Self-pay | Admitting: Internal Medicine

## 2019-10-01 VITALS — BP 128/87 | HR 88 | Temp 95.7°F | Ht 66.0 in | Wt 261.4 lb

## 2019-10-01 DIAGNOSIS — I272 Pulmonary hypertension, unspecified: Secondary | ICD-10-CM

## 2019-10-01 DIAGNOSIS — I361 Nonrheumatic tricuspid (valve) insufficiency: Secondary | ICD-10-CM

## 2019-10-01 DIAGNOSIS — I872 Venous insufficiency (chronic) (peripheral): Secondary | ICD-10-CM | POA: Diagnosis not present

## 2019-10-01 DIAGNOSIS — R0600 Dyspnea, unspecified: Secondary | ICD-10-CM

## 2019-10-01 NOTE — Patient Instructions (Signed)
Medication Instructions:  Continue same medications *If you need a refill on your cardiac medications before your next appointment, please call your pharmacy*  Lab Work: None ordered   Testing/Procedures: Schedule Echo  Follow-Up: At [facility], you and your health needs are our priority.  As part of our continuing mission to provide you with exceptional heart care, we have created designated Provider Care Teams.  These Care Teams include your primary Cardiologist (physician) and Advanced Practice Providers (APPs -  Physician Assistants and Nurse Practitioners) who all work together to provide you with the care you need, when you need it.  Your next appointment:  Schedule after Echo  The format for your next appointment:  Office   Provider:  Dr.Acharya

## 2019-10-01 NOTE — Progress Notes (Signed)
Cardiology Office Note:    Date:  10/01/2019   ID:  Laurie Monroe, DOB 09/13/1970, MRN [id number]  PCP:  [name], DO  Cardiologist:  No primary care provider on file.  Electrophysiologist:  None   Referring MD: [name], *   Chief Complaint: Follow-up to request regurgitation and lower extremity swelling.  History of Present Illness:    Laurie Monroe is a 49 y.o. female with a hx of tricuspid regurgitation, hypertension, seizures, thalassemia trait, and GERD, who presents for follow-up of leg swelling and tricuspid regurgitation.  She is a previous patient of my partner Dr. Saunders Revel.  The patient is some increased stress in her life, losing her mother in October.  Her mother was found to have a brain tumor in the setting of COVID-19 infection.  This has been a difficult time for her family, however she has a good support system and her twin brother and father.  She tells me that she has no chest pain, experiences mild dyspnea on exertion, and also experiences some wheezing.  She has an albuterol inhaler which she uses and has moderate improvement in symptoms with use.  She is very motivated to improve her diet and lifestyle, and is hoping to lose weight.  She walks 11,000 steps a day at work.  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  No osa noted after sleep study.  No rheumatologic diagnoses.  No palpitations, no syncope lifetime. She tells me 25 years ago she was diagnosed with epilepsy and took medication for short period of time.  She has been seizure-free for a very long time and does not feel this is an active issue in her care.  No pnd or orthopnea.  She experiences lower extremity swelling and uses compression garments which she has on today.  She takes furosemide 40 mg as needed for increasing swelling and finds that she needs to do this approximately monthly.  She knows that she is swelling on her feet swell in addition to her lower extremities.  fhx -  twin brother has a stent and father "weak heart"  Past Medical History:  Diagnosis Date  . Allergy   . Anemia   . Chicken pox   . Epilepsy ([place])    Hx of Epilepsy. No seizures in over 20 years  . GERD (gastroesophageal reflux disease)   . Leukocytosis 12/28/2015  . Seizures ([place])    last episode 20 years ago  . Thalassemia trait, alpha 12/28/2015  . Tricuspid regurgitation     Past Surgical History:  Procedure Laterality Date  . ABDOMINAL HYSTERECTOMY N/A 03/28/2016   Procedure: HYSTERECTOMY ABDOMINAL;  Surgeon: Cheri Fowler, MD;  Location: [facility];  Service: Gynecology;  Laterality: N/A;  . BILATERAL SALPINGECTOMY Bilateral 03/28/2016   Procedure: BILATERAL SALPINGECTOMY;  Surgeon: Cheri Fowler, MD;  Location: [facility];  Service: Gynecology;  Laterality: Bilateral;  . DILATION AND CURETTAGE OF UTERUS     Heavy Periods  . VARICOSE VEIN SURGERY      Current Medications: Current Meds  Medication Sig  . albuterol (PROVENTIL HFA;VENTOLIN HFA) 108 (90 Base) MCG/ACT inhaler INHALE 2 PUFFS EVERY 6 HOURS AS NEEDED FOR WHEEZING OR SHORTNESS OF BREATH  . aspirin 81 MG EC tablet TAKE 1 TABLET(81 MG) BY MOUTH DAILY  . azithromycin (ZITHROMAX Z-PAK) 250 MG tablet As directed  . cetirizine (ZYRTEC) 10 MG tablet Take 1 tablet (10 mg total) by mouth daily.  . diclofenac sodium (VOLTAREN) 1 %  GEL Apply 4 g topically 4 (four) times daily.  . furosemide (LASIX) 40 MG tablet TAKE 1 TABLET(40 MG) BY MOUTH DAILY  . mometasone (NASONEX) 50 MCG/ACT nasal spray Place 2 sprays into the nose at bedtime.  . montelukast (SINGULAIR) 10 MG tablet Take 1 tablet (10 mg total) by mouth at bedtime.  . MULTIPLE VITAMIN PO Take 1 tablet by mouth daily.   . promethazine-dextromethorphan (PROMETHAZINE-DM) 6.25-15 MG/5ML syrup Take 5 mLs by mouth 4 (four) times daily as needed for cough.     Allergies:   Patient has no known allergies.   Social History   Socioeconomic History  . Marital status: Single     Spouse name: Not on file  . Number of children: Not on file  . Years of education: Not on file  . Highest education level: Not on file  Occupational History  . Occupation: CNA    Employer: [employer]  Tobacco Use  . Smoking status: Never Smoker  . Smokeless tobacco: Never Used  Substance and Sexual Activity  . Alcohol use: No    Alcohol/week: 0.0 standard drinks  . Drug use: No  . Sexual activity: Yes    Birth control/protection: None  Other Topics Concern  . Not on file  Social History Narrative  . Not on file   Social Determinants of Health   Financial Resource Strain:   . Difficulty of Paying Living Expenses: Not on file  Food Insecurity:   . Worried About Charity fundraiser in the Last Year: Not on file  . Ran Out of Food in the Last Year: Not on file  Transportation Needs:   . Lack of Transportation (Medical): Not on file  . Lack of Transportation (Non-Medical): Not on file  Physical Activity:   . Days of Exercise per Week: Not on file  . Minutes of Exercise per Session: Not on file  Stress:   . Feeling of Stress : Not on file  Social Connections:   . Frequency of Communication with Friends and Family: Not on file  . Frequency of Social Gatherings with Friends and Family: Not on file  . Attends Religious Services: Not on file  . Active Member of Clubs or Organizations: Not on file  . Attends Archivist Meetings: Not on file  . Marital Status: Not on file     Family History: The patient's family history includes Heart disease in her father; Heart disease (age of onset: 8) in her brother; Hypertension in her brother, father, and mother; Liver cancer in her father; Prostate cancer in her father; Stomach cancer in her father.  ROS:   Please see the history of present illness.    All other systems reviewed and are negative.  EKGs/Labs/Other Studies Reviewed:    The following studies were reviewed today:  EKG:  NSR, rate 88  Recent Labs: No  results found for requested labs within last 8760 hours.  Recent Lipid Panel    Component Value Date/Time   CHOL 125 10/23/2016 1738   TRIG 51.0 10/23/2016 1738   HDL 53.60 10/23/2016 1738   CHOLHDL 2 10/23/2016 1738   VLDL 10.2 10/23/2016 1738   LDLCALC 62 10/23/2016 1738    Physical Exam:    VS:  BP 128/87   Pulse 88   Temp (!) 95.7 F (35.4 C)   Ht 5\' 6"  (1.676 m)   Wt 261 lb 6.4 oz (118.6 kg)   LMP 03/28/2016 (Exact Date)   SpO2 100%  BMI 42.19 kg/m     Wt Readings from Last 5 Encounters:  10/01/19 261 lb 6.4 oz (118.6 kg)  08/28/18 254 lb 6 oz (115.4 kg)  05/19/18 260 lb (117.9 kg)  01/07/18 254 lb 12.8 oz (115.6 kg)  06/25/17 255 lb (115.7 kg)     Constitutional: No acute distress Eyes: sclera non-icteric, normal conjunctiva and lids ENMT: normal dentition, moist mucous membranes Cardiovascular: regular rhythm, normal rate, no murmurs. S1 and S2 normal. Radial pulses normal bilaterally. No jugular venous distention.  Respiratory: clear to auscultation bilaterally GI : normal bowel sounds, soft and nontender. No distention.   MSK: extremities warm, well perfused. No edema.  NEURO: grossly nonfocal exam, moves all extremities. PSYCH: alert and oriented x 3, normal mood and affect.  ASSESSMENT:    1. Dyspnea, unspecified type   2. Non-rheumatic tricuspid valve insufficiency   3. Pulmonary hypertension ([place])   4. Chronic venous insufficiency    PLAN:    Tricuspid regurgitation and pulmonary hypertension-she was intended to have a repeat echocardiogram 6 months after her visit with Dr. Saunders Revel in July 2018.  We will accomplish this echocardiogram today, in light of the fact that she is experiencing dyspnea on exertion, and we need to evaluate right heart size and function.  Lower extremity swelling-no other significant heart failure symptoms suggest that her lower extremity swelling is likely secondary to venous insufficiency in the setting of obesity.  She is very  motivated to lose weight, is using compression garments, and is using as needed Lasix.  Cardiovascular risk reduction-her lipids are optimized from 2018, we should recheck this at next visit.  Total time of encounter: 40 minutes total time of encounter, including 30 minutes spent in face-to-face patient care. This time includes coordination of care and counseling regarding TR, PHTN. Remainder of non-face-to-face time involved reviewing chart documents/testing relevant to the patient encounter and documentation in the medical record.  Cherlynn Kaiser, MD [place]  [facility]   Medication Adjustments/Labs and Tests Ordered: Current medicines are reviewed at length with the patient today.  Concerns regarding medicines are outlined above.  Orders Placed This Encounter  Procedures  . EKG 12-Lead  . ECHOCARDIOGRAM COMPLETE   No orders of the defined types were placed in this encounter.   Patient Instructions  Medication Instructions:  Continue same medications *If you need a refill on your cardiac medications before your next appointment, please call your pharmacy*  Lab Work: None ordered   Testing/Procedures: Schedule Echo  Follow-Up: At [facility], you and your health needs are our priority.  As part of our continuing mission to provide you with exceptional heart care, we have created designated Provider Care Teams.  These Care Teams include your primary Cardiologist (physician) and Advanced Practice Providers (APPs -  Physician Assistants and Nurse Practitioners) who all work together to provide you with the care you need, when you need it.  Your next appointment:  Schedule after Echo  The format for your next appointment:  Office   Provider:  Dr.Terran Klinke

## 2019-10-13 ENCOUNTER — Other Ambulatory Visit: Payer: Self-pay

## 2019-10-13 ENCOUNTER — Ambulatory Visit (HOSPITAL_COMMUNITY): Payer: PRIVATE HEALTH INSURANCE | Attending: Cardiology

## 2019-10-13 DIAGNOSIS — R0600 Dyspnea, unspecified: Secondary | ICD-10-CM | POA: Diagnosis not present

## 2019-10-26 ENCOUNTER — Telehealth: Payer: Self-pay | Admitting: *Deleted

## 2019-10-26 ENCOUNTER — Encounter: Payer: Self-pay | Admitting: Internal Medicine

## 2019-10-26 ENCOUNTER — Telehealth (INDEPENDENT_AMBULATORY_CARE_PROVIDER_SITE_OTHER): Payer: PRIVATE HEALTH INSURANCE | Admitting: Internal Medicine

## 2019-10-26 VITALS — Ht 66.0 in

## 2019-10-26 DIAGNOSIS — I872 Venous insufficiency (chronic) (peripheral): Secondary | ICD-10-CM

## 2019-10-26 DIAGNOSIS — I272 Pulmonary hypertension, unspecified: Secondary | ICD-10-CM | POA: Diagnosis not present

## 2019-10-26 DIAGNOSIS — I361 Nonrheumatic tricuspid (valve) insufficiency: Secondary | ICD-10-CM

## 2019-10-26 NOTE — Telephone Encounter (Signed)
RN spoke to patient. Instruction were given  from today's virtual visit 10/26/19 .  AVS SUMMARY has been sent by [facility] and patient request a copy to be mailed.  echo order for feb 2022 - ( 1 Year)   Patient verbalized understanding

## 2019-10-26 NOTE — Progress Notes (Signed)
Virtual Visit via Telephone Note   This visit type was conducted due to national recommendations for restrictions regarding the COVID-19 Pandemic (e.g. social distancing) in an effort to limit this patient's exposure and mitigate transmission in our community.  Due to her co-morbid illnesses, this patient is at least at moderate risk for complications without adequate follow up.  This format is felt to be most appropriate for this patient at this time.  The patient did not have access to video technology/had technical difficulties with video requiring transitioning to audio format only (telephone).  All issues noted in this document were discussed and addressed.  No physical exam could be performed with this format.  Please refer to the patient's chart for her  consent to telehealth for [facility].   Date:  10/26/2019   ID:  Laurie Monroe, DOB 07/18/1971, MRN [id number]  Patient Location: Home Provider Location: Office  PCP:  Laurie Monroe, Laurie Apa, DO  Cardiologist:  Laurie Munroe, MD  Electrophysiologist:  None   Evaluation Performed:  Follow-Up Visit  Chief Complaint:  Follow up TR and PHTN  History of Present Illness:    [name] is a 49 y.o. female with tricuspid regurgitation, hypertension, seizures, thalassemia trait, and GERD.  Presents for follow up after echocardiogram.  Echo shows borderline elevated pulmonary pressure, trivial TR and normal right heart function.  Continues to feel well, no new issues. Continues to be focused on diet and lifestyle modifications.  The patient does not have symptoms concerning for COVID-19 infection (fever, chills, cough, or new shortness of breath).    Past Medical History:  Diagnosis Date  . Allergy   . Anemia   . Chicken pox   . Epilepsy ([place])    Hx of Epilepsy. No seizures in over 20 years  . GERD (gastroesophageal reflux disease)   . Leukocytosis 12/28/2015  . Seizures ([place])    last episode 20 years ago  .  Thalassemia trait, alpha 12/28/2015  . Tricuspid regurgitation    Past Surgical History:  Procedure Laterality Date  . ABDOMINAL HYSTERECTOMY N/A 03/28/2016   Procedure: HYSTERECTOMY ABDOMINAL;  Surgeon: Cheri Fowler, MD;  Location: [facility];  Service: Gynecology;  Laterality: N/A;  . BILATERAL SALPINGECTOMY Bilateral 03/28/2016   Procedure: BILATERAL SALPINGECTOMY;  Surgeon: Cheri Fowler, MD;  Location: [facility];  Service: Gynecology;  Laterality: Bilateral;  . DILATION AND CURETTAGE OF UTERUS     Heavy Periods  . VARICOSE VEIN SURGERY       No outpatient medications have been marked as taking for the 10/26/19 encounter (Appointment) with Laurie Munroe, MD.     Allergies:   Patient has no known allergies.   Social History   Tobacco Use  . Smoking status: Never Smoker  . Smokeless tobacco: Never Used  Substance Use Topics  . Alcohol use: No    Alcohol/week: 0.0 standard drinks  . Drug use: No     Family Hx: The patient's family history includes Heart disease in her father; Heart disease (age of onset: 53) in her brother; Hypertension in her brother, father, and mother; Liver cancer in her father; Prostate cancer in her father; Stomach cancer in her father.  ROS:   Please see the history of present illness.     All other systems reviewed and are negative.   Prior CV studies:   The following studies were reviewed today:    Labs/Other Tests and Data Reviewed:    EKG:  No ECG reviewed.  Recent Labs: No results found for requested labs within last 8760 hours.   Recent Lipid Panel Lab Results  Component Value Date/Time   CHOL 125 10/23/2016 05:38 PM   TRIG 51.0 10/23/2016 05:38 PM   HDL 53.60 10/23/2016 05:38 PM   CHOLHDL 2 10/23/2016 05:38 PM   LDLCALC 62 10/23/2016 05:38 PM    Wt Readings from Last 3 Encounters:  10/01/19 261 lb 6.4 oz (118.6 kg)  08/28/18 254 lb 6 oz (115.4 kg)  05/19/18 260 lb (117.9 kg)     Objective:    Vital Signs:  LMP  03/28/2016 (Exact Date)    VITAL SIGNS:  reviewed GEN:  no acute distress RESPIRATORY:  No increased work of breathing NEURO:  alert and oriented x 3, no obvious focal deficit PSYCH:  normal affect  ASSESSMENT & PLAN:    1. Non-rheumatic tricuspid valve insufficiency   2. Pulmonary hypertension ([place])   3. Chronic venous insufficiency    Will follow up echo in 1 year. Continue using compression stockings. She will contact us if any issues. Follow up in 6 mo in my office.   COVID-19 Education: The signs and symptoms of COVID-19 were discussed with the patient and how to seek care for testing (follow up with PCP or arrange E-visit).  The importance of social distancing was discussed today.  Time:   Today, I have spent 15 minutes with the patient with telehealth technology discussing the above problems.     Medication Adjustments/Labs and Tests Ordered: Current medicines are reviewed at length with the patient today.  Concerns regarding medicines are outlined above.   Tests Ordered: No orders of the defined types were placed in this encounter.   Medication Changes: No orders of the defined types were placed in this encounter.   Follow Up:  6 mo  Signed, Laurie Munroe, MD  10/26/2019 1:27 PM    [facility]

## 2019-10-26 NOTE — Patient Instructions (Addendum)
Medication Instructions:  No changes  *If you need a refill on your cardiac medications before your next appointment, please call your pharmacy*  Lab Work: Not needed   If you have labs (blood work) drawn today and your tests are completely normal, you will receive your results only by: Marland Kitchen MyChart Message (if you have MyChart) OR . A paper copy in the mail If you have any lab test that is abnormal or we need to change your treatment, we will call you to review the results.  Testing/Procedures: Will be schedule at [place]  _ FEB 2022 Your physician has requested that you have an echocardiogram. Echocardiography is a painless test that uses sound waves to create images of your heart. It provides your doctor with information about the size and shape of your heart and how well your heart's chambers and valves are working. This procedure takes approximately one hour. There are no restrictions for this procedure.   Follow-Up: At [facility], you and your health needs are our priority.  As part of our continuing mission to provide you with exceptional heart care, we have created designated Provider Care Teams.  These Care Teams include your primary Cardiologist (physician) and Advanced Practice Providers (APPs -  Physician Assistants and Nurse Practitioners) who all work together to provide you with the care you need, when you need it.  Your next appointment:   6  month(s)- Aug 2021  The format for your next appointment:   In Person  Provider:   Cherlynn Kaiser, MD  Other Instructions N/A

## 2019-11-24 ENCOUNTER — Ambulatory Visit: Payer: PRIVATE HEALTH INSURANCE | Admitting: Family Medicine

## 2019-11-24 ENCOUNTER — Encounter: Payer: Self-pay | Admitting: Family Medicine

## 2019-11-24 VITALS — BP 110/78 | HR 83 | Ht 66.0 in | Wt 250.0 lb

## 2019-11-24 DIAGNOSIS — Z789 Other specified health status: Secondary | ICD-10-CM

## 2019-11-24 NOTE — Progress Notes (Signed)
Subjective:     Patient ID: Laurie Monroe, female   DOB: [birth year], 49 y.o.   MRN: [id number]  HPI Shardasia [facility] the [facility] today for her required wellness visit for her insurance. Her PCP is Dr. Roma Schanz, who she sees at least yearly for her annual physical. She reports her mammogram and pap smear are UTD. She denies any current problems or concerns. She states she is working on weight loss, trying to walk more and eat healthier.   Past Medical History:  Diagnosis Date  . Allergy   . Anemia   . Chicken pox   . Epilepsy ([place])    Hx of Epilepsy. No seizures in over 20 years  . GERD (gastroesophageal reflux disease)   . Leukocytosis 12/28/2015  . Seizures ([place])    last episode 20 years ago  . Thalassemia trait, alpha 12/28/2015  . Tricuspid regurgitation    No Known Allergies  Current Outpatient Medications:  .  albuterol (PROVENTIL HFA;VENTOLIN HFA) 108 (90 Base) MCG/ACT inhaler, INHALE 2 PUFFS EVERY 6 HOURS AS NEEDED FOR WHEEZING OR SHORTNESS OF BREATH, Disp: 8.5 g, Rfl: 1 .  aspirin 81 MG EC tablet, TAKE 1 TABLET(81 MG) BY MOUTH DAILY, Disp: 100 tablet, Rfl: 1 .  cetirizine (ZYRTEC) 10 MG tablet, Take 1 tablet (10 mg total) by mouth daily., Disp: 90 tablet, Rfl: 1 .  furosemide (LASIX) 40 MG tablet, TAKE 1 TABLET(40 MG) BY MOUTH DAILY, Disp: 90 tablet, Rfl: 1 .  mometasone (NASONEX) 50 MCG/ACT nasal spray, Place 2 sprays into the nose at bedtime., Disp: 17 g, Rfl: 1 .  montelukast (SINGULAIR) 10 MG tablet, Take 1 tablet (10 mg total) by mouth at bedtime., Disp: 90 tablet, Rfl: 3 .  MULTIPLE VITAMIN PO, Take 1 tablet by mouth daily. , Disp: , Rfl:  .  promethazine-dextromethorphan (PROMETHAZINE-DM) 6.25-15 MG/5ML syrup, Take 5 mLs by mouth 4 (four) times daily as needed for cough., Disp: 180 mL, Rfl: 0 .  ipratropium (ATROVENT) 0.03 % nasal spray, ipratropium bromide 21 mcg (0.03 %) nasal spray  instill 2 sprays into each nostril twice a day, Disp: , Rfl:     Review of Systems  Constitutional: Negative for chills, fatigue, fever and unexpected weight change.  HENT: Negative for congestion, ear pain, sinus pressure, sinus pain and sore throat.   Eyes: Negative for discharge and visual disturbance.  Respiratory: Negative for cough, shortness of breath and wheezing.   Cardiovascular: Negative for chest pain and leg swelling.  Gastrointestinal: Negative for abdominal pain, blood in stool, constipation, diarrhea, nausea and vomiting.  Genitourinary: Negative for difficulty urinating and hematuria.  Skin: Negative for color change.  Neurological: Negative for dizziness, weakness, light-headedness and headaches.  Hematological: Negative for adenopathy.  All other systems reviewed and are negative.      Objective:   Physical Exam Vitals reviewed.  Constitutional:      General: She is not in acute distress.    Appearance: Normal appearance. She is well-developed.  HENT:     Head: Normocephalic and atraumatic.  Eyes:     General:        Right eye: No discharge.        Left eye: No discharge.  Cardiovascular:     Rate and Rhythm: Normal rate and regular rhythm.     Heart sounds: Normal heart sounds.  Pulmonary:     Effort: Pulmonary effort is normal. No respiratory distress.     Breath sounds: Normal breath  sounds.  Musculoskeletal:     Cervical back: Neck supple.  Skin:    General: Skin is warm and dry.  Neurological:     Mental Status: She is alert and oriented to person, place, and time.  Psychiatric:        Mood and Affect: Mood normal.        Behavior: Behavior normal.    Today's Vitals   11/24/19 1119  BP: 110/78  Pulse: 83  SpO2: 98%  Weight: 250 lb (113.4 kg)  Height: 5\' 6"  (1.676 m)   Body mass index is 40.35 kg/m.     Assessment:     Participant in health and wellness plan      Plan:     1. Keep all regular appts with PCP. 2. Encouraged efforts at weight loss, healthy eating and increasing physical  activity. 3. F/u here prn.

## 2020-01-27 ENCOUNTER — Telehealth: Payer: Self-pay | Admitting: Family Medicine

## 2020-01-27 DIAGNOSIS — J069 Acute upper respiratory infection, unspecified: Secondary | ICD-10-CM

## 2020-01-27 DIAGNOSIS — R600 Localized edema: Secondary | ICD-10-CM

## 2020-01-27 DIAGNOSIS — R059 Cough, unspecified: Secondary | ICD-10-CM

## 2020-01-27 NOTE — Telephone Encounter (Signed)
Medication:promethazine-dextromethorphan (PROMETHAZINE-DM) 6.25-15 MG/5ML syrup  albuterol (PROVENTIL HFA;VENTOLIN HFA) 108  furosemide (LASIX) 40 MG tablet     Has the patient contacted their pharmacy? Yes.   (If no, request that the patient contact the pharmacy for the refill.) (If yes, when and what did the pharmacy advise?)  Preferred Pharmacy (with phone number or street name):  [facility] [place] [address]  [place], [place] [address]  Phone:  [phone] Fax:  [phone]  DEA #:  [id number]  Agent: Please be advised that RX refills may take up to 3 business days. We ask that you follow-up with your pharmacy.

## 2020-01-29 MED ORDER — PROMETHAZINE-DM 6.25-15 MG/5ML PO SYRP: 5.0000 mL | ORAL_SOLUTION | Freq: Four times a day (QID) | ORAL | 0 refills | Status: DC | PRN

## 2020-01-29 MED ORDER — ALBUTEROL SULFATE HFA 108 (90 BASE) MCG/ACT IN AERS: INHALATION_SPRAY | RESPIRATORY_TRACT | 1 refills | Status: DC

## 2020-01-29 MED ORDER — FUROSEMIDE 40 MG PO TABS: ORAL_TABLET | ORAL | 1 refills | Status: DC

## 2020-01-29 NOTE — Telephone Encounter (Signed)
Medications sent to pharmacy

## 2020-03-17 ENCOUNTER — Telehealth: Payer: Self-pay | Admitting: Internal Medicine

## 2020-03-17 NOTE — Telephone Encounter (Signed)
LVM for patient to return call to get scheduled with Acharya from recall list °

## 2020-03-31 ENCOUNTER — Telehealth: Payer: Self-pay | Admitting: *Deleted

## 2020-03-31 NOTE — Telephone Encounter (Signed)
A message was left, re: follow up visit. 

## 2020-06-16 ENCOUNTER — Ambulatory Visit: Payer: PRIVATE HEALTH INSURANCE | Admitting: Internal Medicine

## 2020-06-17 ENCOUNTER — Encounter: Payer: Self-pay | Admitting: Internal Medicine

## 2020-07-11 ENCOUNTER — Other Ambulatory Visit: Payer: Self-pay

## 2020-07-11 DIAGNOSIS — J302 Other seasonal allergic rhinitis: Secondary | ICD-10-CM

## 2020-07-11 MED ORDER — CETIRIZINE HCL 10 MG PO TABS: 10.0000 mg | ORAL_TABLET | Freq: Every day | ORAL | 1 refills | Status: DC

## 2020-07-22 ENCOUNTER — Ambulatory Visit: Payer: PRIVATE HEALTH INSURANCE | Admitting: Family Medicine

## 2020-08-11 ENCOUNTER — Encounter: Payer: Self-pay | Admitting: Family Medicine

## 2020-08-11 ENCOUNTER — Encounter: Payer: Self-pay | Admitting: *Deleted

## 2020-08-11 ENCOUNTER — Telehealth (INDEPENDENT_AMBULATORY_CARE_PROVIDER_SITE_OTHER): Payer: PRIVATE HEALTH INSURANCE | Admitting: Family Medicine

## 2020-08-11 ENCOUNTER — Telehealth: Payer: Self-pay | Admitting: Family Medicine

## 2020-08-11 DIAGNOSIS — R059 Cough, unspecified: Secondary | ICD-10-CM | POA: Diagnosis not present

## 2020-08-11 MED ORDER — PREDNISONE 20 MG PO TABS: 40.0000 mg | ORAL_TABLET | Freq: Every day | ORAL | 0 refills | Status: DC

## 2020-08-11 NOTE — Progress Notes (Signed)
Virtual Visit via Video Note  I connected with Honestee  on 08/11/20 at  1:20 PM EST by a video enabled telemedicine application and verified that I am speaking with the correct person using two identifiers.  Location patient: home, [place] Location provider:work or home office Persons participating in the virtual visit: patient, provider  I discussed the limitations of evaluation and management by telemedicine and the availability of in person appointments. The patient expressed understanding and agreed to proceed.   HPI:  Acute telemedicine visit for sinus congestion: -Onset: 2 weeks ago -Symptoms include: sinus congestion, cough, HA, some wheezing at times - uses albuterol for this -now main symptom is cough -Denies: fevers, SOB, NVD, CP (except when coughs a lot), mucus production, sinus nasal congestion -Has tried: OTC meds -Pertinent past medical history: hx of bronchitis, uses albuterol when sick -reports hx of prolonged cough after resp illness and need for prednisone in the past -Pertinent medication allergies: nkda -did not have a covid test -COVID-19 vaccine status: fully vaccinated  ROS: See pertinent positives and negatives per HPI.  Past Medical History:  Diagnosis Date  . Allergy   . Anemia   . Chicken pox   . Epilepsy ([place])    Hx of Epilepsy. No seizures in over 20 years  . GERD (gastroesophageal reflux disease)   . Leukocytosis 12/28/2015  . Seizures ([place])    last episode 20 years ago  . Thalassemia trait, alpha 12/28/2015  . Tricuspid regurgitation     Past Surgical History:  Procedure Laterality Date  . ABDOMINAL HYSTERECTOMY N/A 03/28/2016   Procedure: HYSTERECTOMY ABDOMINAL;  Surgeon: Cheri Fowler, MD;  Location: [facility];  Service: Gynecology;  Laterality: N/A;  . BILATERAL SALPINGECTOMY Bilateral 03/28/2016   Procedure: BILATERAL SALPINGECTOMY;  Surgeon: Cheri Fowler, MD;  Location: [facility];  Service: Gynecology;  Laterality: Bilateral;  . DILATION AND  CURETTAGE OF UTERUS     Heavy Periods  . VARICOSE VEIN SURGERY       Current Outpatient Medications:  .  albuterol (VENTOLIN HFA) 108 (90 Base) MCG/ACT inhaler, INHALE 2 PUFFS EVERY 6 HOURS AS NEEDED FOR WHEEZING OR SHORTNESS OF BREATH, Disp: 8.5 g, Rfl: 1 .  aspirin 81 MG EC tablet, TAKE 1 TABLET(81 MG) BY MOUTH DAILY, Disp: 100 tablet, Rfl: 1 .  cetirizine (ZYRTEC) 10 MG tablet, Take 1 tablet (10 mg total) by mouth daily., Disp: 90 tablet, Rfl: 1 .  furosemide (LASIX) 40 MG tablet, TAKE 1 TABLET(40 MG) BY MOUTH DAILY, Disp: 90 tablet, Rfl: 1 .  ipratropium (ATROVENT) 0.03 % nasal spray, ipratropium bromide 21 mcg (0.03 %) nasal spray  instill 2 sprays into each nostril twice a day, Disp: , Rfl:  .  mometasone (NASONEX) 50 MCG/ACT nasal spray, Place 2 sprays into the nose at bedtime., Disp: 17 g, Rfl: 1 .  montelukast (SINGULAIR) 10 MG tablet, Take 1 tablet (10 mg total) by mouth at bedtime., Disp: 90 tablet, Rfl: 3 .  MULTIPLE VITAMIN PO, Take 1 tablet by mouth daily. , Disp: , Rfl:  .  promethazine-dextromethorphan (PROMETHAZINE-DM) 6.25-15 MG/5ML syrup, Take 5 mLs by mouth 4 (four) times daily as needed for cough., Disp: 180 mL, Rfl: 0  EXAM:  VITALS per patient if applicable:  GENERAL: alert, oriented, appears well and in no acute distress  HEENT: atraumatic, conjunttiva clear, no obvious abnormalities on inspection of external nose and ears  NECK: normal movements of the head and neck  LUNGS: on inspection no signs of respiratory distress, breathing  rate appears normal, no obvious gross SOB, gasping or wheezing  CV: no obvious cyanosis  MS: moves all visible extremities without noticeable abnormality  PSYCH/NEURO: pleasant and cooperative, no obvious depression or anxiety, speech and thought processing grossly intact  ASSESSMENT AND PLAN:  Discussed the following assessment and plan:  No diagnosis found.  -we discussed possible serious and likely etiologies, options for  evaluation and workup, limitations of telemedicine visit vs in person visit, treatment, treatment risks and precautions. Pt prefers to treat via telemedicine empirically rather than in person at this moment.  Query post viral cough vs bronchitis vs other. She is only using her inhaler about 1x per week for excessive cough and advised cough use more frequently if needed, not more than every 4-6 hours. Also, she opted to try prednisone boost which she feels has really helped with similar issues in the past - discussed risks. Also discussed OTC and prescription cough medications and she will try OTC options. Discussed possibility of infection that may require abx if this is not doing the trick - however she denies any mucus production, SOB, fevers or malaise, so feel less likely. Also discuss possibility of covid breakthrough case. She requested a work note that she was seen today. Provided in pt instructions.   Scheduled follow up with PCP offered: agrees to follow up if needed.   Advised to seek prompt in person care if worsening, new symptoms arise, or if is not improving with treatment. Discussed options for inperson care if PCP office not available. Did let this patient know that I only do telemedicine on Tuesdays and Thursdays for [place]. Advised to schedule follow up visit with PCP or [facility] if any further questions or concerns to avoid delays in care.   I discussed the assessment and treatment plan with the patient. The patient was provided an opportunity to ask questions and all were answered. The patient agreed with the plan and demonstrated an understanding of the instructions.     Lucretia Kern, DO

## 2020-08-11 NOTE — Patient Instructions (Addendum)
   ---------------------------------------------------------------------------------------------------------------------------      WORK SLIP:  Patient Laurie Monroe,  02/09/1971, was seen for a medical visit today, 08/11/20 . Please excuse from work today.   Sincerely: E-signature: Dr. Colin Benton, DO [place] Ph: [phone]   ------------------------------------------------------------------------------------------------------------------------------    -I sent the medication(s) we discussed to your pharmacy: Meds ordered this encounter  Medications  . predniSONE (DELTASONE) 20 MG tablet    Sig: Take 2 tablets (40 mg total) by mouth daily with breakfast.    Dispense:  8 tablet    Refill:  0     I hope you are feeling better soon!  Seek in person care promptly if your symptoms worsen, new concerns arise or you are not improving with treatment.  It was nice to meet you today. I help [facility] out with telemedicine visits on Tuesdays and Thursdays and am available for visits on those days. If you have any concerns or questions following this visit please schedule a follow up visit with your Primary Care doctor or seek care at a local urgent care clinic to avoid delays in care.

## 2020-08-11 NOTE — Telephone Encounter (Signed)
Patient informed of the message below.   Per patients preference the letter was completed and sent to her Mychart acct.

## 2020-08-11 NOTE — Telephone Encounter (Signed)
Patient is calling back and wanted to ask provider if she can go back to work tomorrow or does she need to be out with another work note, please advise. CB is [phone]

## 2020-08-11 NOTE — Telephone Encounter (Signed)
I must have misunderstood, thought she just wanted work note for visit today as has been sick for several weeks. Ok to provide per below if she prefers, which says to return when feeling better. If not doing better over the next 2 days she should seek inperson evaluation. Thanks.      ---------------------------------------------------------------------------------------------------------------------------    WORK SLIP:  Patient Laurie Monroe,  December 10, 1970, was seen for a medical visit today, 08/11/20 . Please excuse from work according to the [facility] guidelines for a respiratory illness. We advise 10 days minimum from the onset of symptoms (2 weeks ago) PLUS 1 day of no fever and improved symptoms. Will defer to employer for a sooner return to work if [place] testing is negative and the symptoms have resolved. Sincerely: E-signature: Dr. Colin Benton, DO [facility] Ph: [phone]   ------------------------------------------------------------------------------------------------------------------------------

## 2020-10-06 ENCOUNTER — Encounter (HOSPITAL_COMMUNITY): Payer: Self-pay | Admitting: Internal Medicine

## 2020-10-13 ENCOUNTER — Other Ambulatory Visit: Payer: Self-pay | Admitting: Family Medicine

## 2020-10-13 DIAGNOSIS — J302 Other seasonal allergic rhinitis: Secondary | ICD-10-CM

## 2020-10-14 NOTE — Telephone Encounter (Signed)
Has not been seen since 09/2019, no pending appointment.s

## 2020-10-18 ENCOUNTER — Telehealth: Payer: Self-pay | Admitting: Family Medicine

## 2020-10-18 NOTE — Telephone Encounter (Signed)
Medication: montelukast (SINGULAIR) 10 MG tablet [[id number]]    Has the patient contacted their pharmacy? no (If no, request that the patient contact the pharmacy for the refill.) (If yes, when and what did the pharmacy advise?)    Preferred Pharmacy (with phone number or street name):  [facility] [place], [place] Phone:  [phone]  Fax:  [phone]      Agent: Please be advised that RX refills may take up to 3 business days. We ask that you follow-up with your pharmacy.

## 2020-10-20 ENCOUNTER — Telehealth (HOSPITAL_COMMUNITY): Payer: Self-pay | Admitting: Internal Medicine

## 2020-10-20 NOTE — Telephone Encounter (Signed)
Just an FYI. We have made several attempts to contact this patient including sending a letter to schedule or reschedule their echocardiogram. We will be removing the patient from the echo WQ.  MAILED LETTER [facility]W  10/06/20 [facility] to schedule @ 10:39/LBW  10/03/20 [facility] to schedule @ 10:04 am [facility]W  09/26/20 Spoke with patient and she is at work and will call me bacl to schedule @ 10:57/LBW      Thank you

## 2020-10-27 ENCOUNTER — Other Ambulatory Visit: Payer: Self-pay

## 2020-10-27 ENCOUNTER — Ambulatory Visit: Payer: PRIVATE HEALTH INSURANCE | Admitting: Family Medicine

## 2020-10-27 VITALS — BP 122/80 | HR 87 | Temp 98.9°F | Resp 18 | Wt 261.2 lb

## 2020-10-27 DIAGNOSIS — D509 Iron deficiency anemia, unspecified: Secondary | ICD-10-CM | POA: Diagnosis not present

## 2020-10-27 DIAGNOSIS — J069 Acute upper respiratory infection, unspecified: Secondary | ICD-10-CM

## 2020-10-27 DIAGNOSIS — R059 Cough, unspecified: Secondary | ICD-10-CM | POA: Diagnosis not present

## 2020-10-27 DIAGNOSIS — D563 Thalassemia minor: Secondary | ICD-10-CM

## 2020-10-27 DIAGNOSIS — J302 Other seasonal allergic rhinitis: Secondary | ICD-10-CM

## 2020-10-27 DIAGNOSIS — R600 Localized edema: Secondary | ICD-10-CM

## 2020-10-27 MED ORDER — MOMETASONE FUROATE 50 MCG/ACT NA SUSP: 2.0000 | Freq: Every day | NASAL | 3 refills | Status: DC

## 2020-10-27 MED ORDER — PROMETHAZINE-DM 6.25-15 MG/5ML PO SYRP
5.0000 mL | ORAL_SOLUTION | Freq: Four times a day (QID) | ORAL | 0 refills | Status: DC | PRN
Start: 2020-10-27 — End: 2021-12-21

## 2020-10-27 MED ORDER — MONTELUKAST SODIUM 10 MG PO TABS
10.0000 mg | ORAL_TABLET | Freq: Every day | ORAL | 3 refills | Status: DC
Start: 2020-10-27 — End: 2021-11-15

## 2020-10-27 MED ORDER — ALBUTEROL SULFATE HFA 108 (90 BASE) MCG/ACT IN AERS: INHALATION_SPRAY | RESPIRATORY_TRACT | 1 refills | Status: DC

## 2020-10-27 MED ORDER — FUROSEMIDE 40 MG PO TABS
ORAL_TABLET | ORAL | 1 refills | Status: DC
Start: 2020-10-27 — End: 2023-05-14

## 2020-10-27 NOTE — Patient Instructions (Signed)
Obesity, Adult Obesity is the condition of having too much total body fat. Being overweight or obese means that your weight is greater than what is considered healthy for your body size. Obesity is determined by a measurement called BMI. BMI is an estimate of body fat and is calculated from height and weight. For adults, a BMI of 30 or higher is considered obese. Obesity can lead to other health concerns and [place] illnesses, including:  Stroke.  Coronary artery disease (CAD).  Type 2 diabetes.  Some types of cancer, including cancers of the colon, breast, uterus, and gallbladder.  Osteoarthritis.  High blood pressure (hypertension).  High cholesterol.  Sleep apnea.  Gallbladder stones.  Infertility problems. What are the causes? Common causes of this condition include:  Eating daily meals that are high in calories, sugar, and fat.  Being born with genes that may make you more likely to become obese.  Having a medical condition that causes obesity, including: ? Hypothyroidism. ? Polycystic ovarian syndrome (PCOS). ? Binge-eating disorder. ? Cushing syndrome.  Taking certain medicines, such as steroids, antidepressants, and seizure medicines.  Not being physically active (sedentary lifestyle).  Not getting enough sleep.  Drinking high amounts of sugar-sweetened beverages, such as soft drinks. What increases the risk? The following factors may make you more likely to develop this condition:  Having a family history of obesity.  Being a woman of African American descent.  Being a man of Hispanic descent.  Living in an area with limited access to: ? Romilda Garret, recreation centers, or sidewalks. ? Healthy food choices, such as grocery stores and farmers' markets. What are the signs or symptoms? The main sign of this condition is having too much body fat. How is this diagnosed? This condition is diagnosed based on:  Your BMI. If you are an adult with a BMI of 30 or  higher, you are considered obese.  Your waist circumference. This measures the distance around your waistline.  Your skinfold thickness. Your health care provider may gently pinch a fold of your skin and measure it. You may have other tests to check for underlying conditions. How is this treated? Treatment for this condition often includes changing your lifestyle. Treatment may include some or all of the following:  Dietary changes. This may include developing a healthy meal plan.  Regular physical activity. This may include activity that causes your heart to beat faster (aerobic exercise) and strength training. Work with your health care provider to design an exercise program that works for you.  Medicine to help you lose weight if you are unable to lose 1 pound a week after 6 weeks of healthy eating and more physical activity.  Treating conditions that cause the obesity (underlying conditions).  Surgery. Surgical options may include gastric banding and gastric bypass. Surgery may be done if: ? Other treatments have not helped to improve your condition. ? You have a BMI of 40 or higher. ? You have life-threatening health problems related to obesity. Follow these instructions at home: Eating and drinking  Follow recommendations from your health care provider about what you eat and drink. Your health care provider may advise you to: ? Limit fast food, sweets, and processed snack foods. ? Choose low-fat options, such as low-fat milk instead of whole milk. ? Eat 5 or more servings of fruits or vegetables every day. ? Eat at home more often. This gives you more control over what you eat. ? Choose healthy foods when you eat out. ? Learn  to read food labels. This will help you understand how much food is considered 1 serving. ? Learn what a healthy serving size is. ? Keep low-fat snacks available. ? Limit sugary drinks, such as soda, fruit juice, sweetened iced tea, and flavored  milk.  Drink enough water to keep your urine pale yellow.  Do not follow a fad diet. Fad diets can be unhealthy and even dangerous.   Physical activity  Exercise regularly, as told by your health care provider. ? Most adults should get up to 150 minutes of moderate-intensity exercise every week. ? Ask your health care provider what types of exercise are safe for you and how often you should exercise.  Warm up and stretch before being active.  Cool down and stretch after being active.  Rest between periods of activity. Lifestyle  Work with your health care provider and a dietitian to set a weight-loss goal that is healthy and reasonable for you.  Limit your screen time.  Find ways to reward yourself that do not involve food.  Do not drink alcohol if: ? Your health care provider tells you not to drink. ? You are pregnant, may be pregnant, or are planning to become pregnant.  If you drink alcohol: ? Limit how much you use to:  0-1 drink a day for women.  0-2 drinks a day for men. ? Be aware of how much alcohol is in your drink. In the [place] one drink equals one 12 oz bottle of beer (355 mL), one 5 oz glass of wine (148 mL), or one 1 oz glass of hard liquor (44 mL). General instructions  Keep a weight-loss journal to keep track of the food you eat and how much exercise you get.  Take over-the-counter and prescription medicines only as told by your health care provider.  Take vitamins and supplements only as told by your health care provider.  Consider joining a support group. Your health care provider may be able to recommend a support group.  Keep all follow-up visits as told by your health care provider. This is important. Contact a health care provider if:  You are unable to meet your weight loss goal after 6 weeks of dietary and lifestyle changes. Get help right away if you are having:  Trouble breathing.  Suicidal thoughts or behaviors. Summary  Obesity is  the condition of having too much total body fat.  Being overweight or obese means that your weight is greater than what is considered healthy for your body size.  Work with your health care provider and a dietitian to set a weight-loss goal that is healthy and reasonable for you.  Exercise regularly, as told by your health care provider. Ask your health care provider what types of exercise are safe for you and how often you should exercise. This information is not intended to replace advice given to you by your health care provider. Make sure you discuss any questions you have with your health care provider. Document Revised: 04/24/2018 Document Reviewed: 04/24/2018 Elsevier Patient Education  2021 [employer].

## 2020-10-30 ENCOUNTER — Encounter: Payer: Self-pay | Admitting: Family Medicine

## 2020-10-30 DIAGNOSIS — R600 Localized edema: Secondary | ICD-10-CM | POA: Insufficient documentation

## 2020-10-30 DIAGNOSIS — R059 Cough, unspecified: Secondary | ICD-10-CM | POA: Insufficient documentation

## 2020-10-30 NOTE — Progress Notes (Signed)
Patient ID: Laurie Monroe, female    DOB: 10/03/1970  Age: 50 y.o. MRN: [id number]    Subjective:  Subjective  HPI Laurie Monroe presents for f/u iron and cholesterol and med refill    No complaints   Review of Systems  Constitutional: Negative for appetite change, diaphoresis, fatigue and unexpected weight change.  Eyes: Negative for pain, redness and visual disturbance.  Respiratory: Negative for cough, chest tightness, shortness of breath and wheezing.   Cardiovascular: Negative for chest pain, palpitations and leg swelling.  Endocrine: Negative for cold intolerance, heat intolerance, polydipsia, polyphagia and polyuria.  Genitourinary: Negative for difficulty urinating, dysuria and frequency.  Neurological: Negative for dizziness, light-headedness, numbness and headaches.    History Past Medical History:  Diagnosis Date  . Allergy   . Anemia   . Chicken pox   . Epilepsy ([place])    Hx of Epilepsy. No seizures in over 20 years  . GERD (gastroesophageal reflux disease)   . Leukocytosis 12/28/2015  . Seizures ([place])    last episode 20 years ago  . Thalassemia trait, alpha 12/28/2015  . Tricuspid regurgitation     She has a past surgical history that includes Dilation and curettage of uterus; Varicose vein surgery; Abdominal hysterectomy (N/A, 03/28/2016); and Bilateral salpingectomy (Bilateral, 03/28/2016).   Her family history includes Heart disease in her father; Heart disease (age of onset: 36) in her brother; Hypertension in her brother, father, and mother; Liver cancer in her father; Prostate cancer in her father; Stomach cancer in her father.She reports that she has never smoked. She has never used smokeless tobacco. She reports that she does not drink alcohol and does not use drugs.  Current Outpatient Medications on File Prior to Visit  Medication Sig Dispense Refill  . aspirin 81 MG EC tablet TAKE 1 TABLET(81 MG) BY MOUTH DAILY 100 tablet 1  . cetirizine (ZYRTEC) 10 MG  tablet Take 1 tablet (10 mg total) by mouth daily. 90 tablet 1  . MULTIPLE VITAMIN PO Take 1 tablet by mouth daily.      No current facility-administered medications on file prior to visit.     Objective:  Objective  Physical Exam Vitals and nursing note reviewed.  Constitutional:      Appearance: She is well-developed and well-nourished.  HENT:     Head: Normocephalic and atraumatic.  Eyes:     Extraocular Movements: EOM normal.     Conjunctiva/sclera: Conjunctivae normal.  Neck:     Thyroid: No thyromegaly.     Vascular: No carotid bruit or JVD.  Cardiovascular:     Rate and Rhythm: Normal rate and regular rhythm.     Heart sounds: Normal heart sounds. No murmur heard.   Pulmonary:     Effort: Pulmonary effort is normal. No respiratory distress.     Breath sounds: Normal breath sounds. No wheezing or rales.  Chest:     Chest wall: No tenderness.  Musculoskeletal:        General: No edema.     Cervical back: Normal range of motion and neck supple.  Neurological:     Mental Status: She is alert and oriented to person, place, and time.  Psychiatric:        Mood and Affect: Mood and affect normal.    BP 122/80 (BP Location: Left Arm, Patient Position: Sitting, Cuff Size: Large)   Pulse 87   Temp 98.9 F (37.2 C) (Oral)   Resp 18   Wt 261 lb 3.2 oz (  118.5 kg)   LMP 03/28/2016 (Exact Date)   SpO2 98%   BMI 42.16 kg/m  Wt Readings from Last 3 Encounters:  10/27/20 261 lb 3.2 oz (118.5 kg)  11/24/19 250 lb (113.4 kg)  10/01/19 261 lb 6.4 oz (118.6 kg)     Lab Results  Component Value Date   WBC 13.1 (H) 12/28/2016   HGB 13.1 12/28/2016   HCT 37.4 12/28/2016   PLT 359 12/28/2016   GLUCOSE 80 10/26/2016   CHOL 125 10/23/2016   TRIG 51.0 10/23/2016   HDL 53.60 10/23/2016   LDLCALC 62 10/23/2016   ALT 15 10/26/2016   AST 21 10/26/2016   NA 139 10/26/2016   K 4.3 10/26/2016   CL 101 10/26/2016   CREATININE 0.72 10/26/2016   BUN 12 10/26/2016   CO2 22  10/26/2016   TSH 1.031 04/18/2015   HGBA1C 5.2 10/07/2014   MICROALBUR 0.7 11/21/2015    DG Knee Complete 4 Views Right  Result Date: 06/26/2017 CLINICAL DATA:  Right knee pain, no known injury, initial encounter EXAM: RIGHT KNEE - COMPLETE 4+ VIEW COMPARISON:  None. FINDINGS: Medial joint space narrowing is noted with osteophytic change. No acute fracture or dislocation is seen. No joint effusion is noted. No soft tissue abnormality is seen. IMPRESSION: Degenerative change without acute abnormality. Electronically Signed   By: Inez Catalina M.D.   On: 06/26/2017 17:48     Assessment & Plan:  Plan  I have discontinued Preslynn L. Barbara's ipratropium and predniSONE. I have also changed her mometasone. Additionally, I am having her maintain her MULTIPLE VITAMIN PO, aspirin, cetirizine, albuterol, furosemide, promethazine-dextromethorphan, and montelukast.  Meds ordered this encounter  Medications  . albuterol (VENTOLIN HFA) 108 (90 Base) MCG/ACT inhaler    Sig: INHALE 2 PUFFS EVERY 6 HOURS AS NEEDED FOR WHEEZING OR SHORTNESS OF BREATH    Dispense:  8.5 g    Refill:  1  . furosemide (LASIX) 40 MG tablet    Sig: TAKE 1 TABLET(40 MG) BY MOUTH DAILY    Dispense:  90 tablet    Refill:  1  . promethazine-dextromethorphan (PROMETHAZINE-DM) 6.25-15 MG/5ML syrup    Sig: Take 5 mLs by mouth 4 (four) times daily as needed for cough.    Dispense:  180 mL    Refill:  0  . montelukast (SINGULAIR) 10 MG tablet    Sig: Take 1 tablet (10 mg total) by mouth at bedtime.    Dispense:  90 tablet    Refill:  3  . mometasone (NASONEX) 50 MCG/ACT nasal spray    Sig: Place 2 sprays into the nose at bedtime. As needed.    Dispense:  17 g    Refill:  3    Problem List Items Addressed This Visit      Unprioritized   Bilateral edema of lower extremity    Elevate legs Compression socks Diuretics  Check labs       Relevant Medications   furosemide (LASIX) 40 MG tablet   Cough   Relevant Medications    albuterol (VENTOLIN HFA) 108 (90 Base) MCG/ACT inhaler   Iron deficiency anemia - Primary   Relevant Orders   CBC with Differential/Platelet   Comprehensive metabolic panel   Morbid obesity (HCC)    D/w pt diet and exercise Check labs  Consider meds       Relevant Orders   Lipid panel   CBC with Differential/Platelet   TSH   Comprehensive metabolic panel   IBC +  Ferritin   Insulin, random   Amb Ref to Medical Weight Management   Thalassemia trait, alpha (Chronic)    Check labs May need to f/u hematology      Viral upper respiratory tract infection   Relevant Medications   promethazine-dextromethorphan (PROMETHAZINE-DM) 6.25-15 MG/5ML syrup    Other Visit Diagnoses    Seasonal allergies       Relevant Medications   montelukast (SINGULAIR) 10 MG tablet   Acute upper respiratory infection       Relevant Medications   mometasone (NASONEX) 50 MCG/ACT nasal spray      Follow-up: Return in about 3 months (around 01/24/2021), or if symptoms worsen or fail to improve, for annual exam, fasting.  [name], DO

## 2020-10-30 NOTE — Assessment & Plan Note (Signed)
Check labs May need to f/u hematology

## 2020-10-30 NOTE — Assessment & Plan Note (Signed)
D/w pt diet and exercise Check labs  Consider meds

## 2020-10-30 NOTE — Assessment & Plan Note (Signed)
Elevate legs Compression socks Diuretics  Check labs

## 2020-10-31 ENCOUNTER — Other Ambulatory Visit (INDEPENDENT_AMBULATORY_CARE_PROVIDER_SITE_OTHER): Payer: PRIVATE HEALTH INSURANCE

## 2020-10-31 ENCOUNTER — Other Ambulatory Visit: Payer: Self-pay

## 2020-10-31 DIAGNOSIS — D509 Iron deficiency anemia, unspecified: Secondary | ICD-10-CM

## 2020-10-31 NOTE — Addendum Note (Signed)
Addended by: Manuela Schwartz on: 10/31/2020 02:49 PM   Modules accepted: Orders

## 2020-11-01 LAB — COMPREHENSIVE METABOLIC PANEL
ALT: 12 U/L (ref 0–35)
AST: 14 U/L (ref 0–37)
Albumin: 4 g/dL (ref 3.5–5.2)
Alkaline Phosphatase: 71 U/L (ref 39–117)
BUN: 16 mg/dL (ref 6–23)
CO2: 27 mEq/L (ref 19–32)
Calcium: 9.3 mg/dL (ref 8.4–10.5)
Chloride: 104 mEq/L (ref 96–112)
Creatinine, Ser: 0.87 mg/dL (ref 0.40–1.20)
GFR: 79.22 mL/min (ref >60.00)
Glucose, Bld: 68 mg/dL — ABNORMAL LOW (ref 70–99)
Potassium: 4 mEq/L (ref 3.5–5.1)
Sodium: 138 mEq/L (ref 135–145)
Total Bilirubin: 0.4 mg/dL (ref 0.2–1.2)
Total Protein: 6.9 g/dL (ref 6.0–8.3)

## 2020-11-01 LAB — CBC WITH DIFFERENTIAL/PLATELET
Basophils Absolute: 0.2 10*3/uL — ABNORMAL HIGH (ref 0.0–0.1)
Basophils Relative: 1.3 % (ref 0.0–3.0)
Eosinophils Absolute: 0.2 10*3/uL (ref 0.0–0.7)
Eosinophils Relative: 1.8 % (ref 0.0–5.0)
HCT: 38.7 % (ref 36.0–46.0)
Hemoglobin: 12.3 g/dL (ref 12.0–15.0)
Lymphocytes Relative: 24.7 % (ref 12.0–46.0)
Lymphs Abs: 3.2 10*3/uL (ref 0.7–4.0)
MCHC: 31.8 g/dL (ref 30.0–36.0)
MCV: 65.6 fl — ABNORMAL LOW (ref 78.0–100.0)
Monocytes Absolute: 1.2 10*3/uL — ABNORMAL HIGH (ref 0.1–1.0)
Monocytes Relative: 8.9 % (ref 3.0–12.0)
Neutro Abs: 8.2 10*3/uL — ABNORMAL HIGH (ref 1.4–7.7)
Neutrophils Relative %: 63.3 % (ref 43.0–77.0)
Platelets: 324 10*3/uL (ref 150.0–400.0)
RBC: 5.9 Mil/uL — ABNORMAL HIGH (ref 3.87–5.11)
RDW: 14.9 % (ref 11.5–15.5)
WBC: 13 10*3/uL — ABNORMAL HIGH (ref 4.0–10.5)

## 2020-11-01 LAB — LIPID PANEL
Cholesterol: 137 mg/dL (ref 0–200)
HDL: 49.7 mg/dL (ref >39.00)
LDL Cholesterol: 70 mg/dL (ref 0–99)
NonHDL: 87.43
Total CHOL/HDL Ratio: 3
Triglycerides: 87 mg/dL (ref 0.0–149.0)
VLDL: 17.4 mg/dL (ref 0.0–40.0)

## 2020-11-01 LAB — TSH: TSH: 1.57 u[IU]/mL (ref 0.35–4.50)

## 2020-11-01 LAB — IBC + FERRITIN
Ferritin: 231.7 ng/mL (ref 10.0–291.0)
Iron: 30 ug/dL — ABNORMAL LOW (ref 42–145)
Saturation Ratios: 9.8 % — ABNORMAL LOW (ref 20.0–50.0)
Transferrin: 219 mg/dL (ref 212.0–360.0)

## 2020-12-27 ENCOUNTER — Encounter (INDEPENDENT_AMBULATORY_CARE_PROVIDER_SITE_OTHER): Payer: Self-pay

## 2021-01-26 ENCOUNTER — Encounter: Payer: PRIVATE HEALTH INSURANCE | Admitting: Family Medicine

## 2021-01-26 ENCOUNTER — Other Ambulatory Visit: Payer: Self-pay

## 2021-01-26 DIAGNOSIS — J302 Other seasonal allergic rhinitis: Secondary | ICD-10-CM

## 2021-01-26 MED ORDER — CETIRIZINE HCL 10 MG PO TABS: 10.0000 mg | ORAL_TABLET | Freq: Every day | ORAL | 1 refills | Status: DC

## 2021-02-28 ENCOUNTER — Ambulatory Visit: Payer: PRIVATE HEALTH INSURANCE | Admitting: Family

## 2021-02-28 ENCOUNTER — Encounter: Payer: Self-pay | Admitting: Family

## 2021-02-28 VITALS — BP 148/78 | HR 72 | Temp 95.3°F | Resp 18 | Ht 66.0 in | Wt 252.0 lb

## 2021-02-28 NOTE — Progress Notes (Signed)
Established Patient Office Visit  Subjective:  Patient ID: Laurie Monroe, female    DOB: Dec 25, 1970  Age: 50 y.o. MRN: [id number]  CC: No chief complaint on file.   HPI [place] presents for her wellness visit.   Past Medical History:  Diagnosis Date   Allergy    Anemia    Chicken pox    Epilepsy ([place])    Hx of Epilepsy. No seizures in over 20 years   GERD (gastroesophageal reflux disease)    Leukocytosis 12/28/2015   Seizures ([place])    last episode 20 years ago   Thalassemia trait, alpha 12/28/2015   Tricuspid regurgitation     Past Surgical History:  Procedure Laterality Date   ABDOMINAL HYSTERECTOMY N/A 03/28/2016   Procedure: HYSTERECTOMY ABDOMINAL;  Surgeon: Cheri Fowler, MD;  Location: [facility];  Service: Gynecology;  Laterality: N/A;   BILATERAL SALPINGECTOMY Bilateral 03/28/2016   Procedure: BILATERAL SALPINGECTOMY;  Surgeon: Cheri Fowler, MD;  Location: [facility];  Service: Gynecology;  Laterality: Bilateral;   DILATION AND CURETTAGE OF UTERUS     Heavy Periods   VARICOSE VEIN SURGERY      Family History  Problem Relation Age of Onset   Hypertension Mother    Prostate cancer Father    Hypertension Father    Liver cancer Father    Stomach cancer Father    Heart disease Father    Hypertension Brother        Twin Brother   Heart disease Brother 44       coronary stent    Social History   Socioeconomic History   Marital status: Single    Spouse name: Not on file   Number of children: Not on file   Years of education: Not on file   Highest education level: Not on file  Occupational History   Occupation: CNA    Employer: [employer]  Tobacco Use   Smoking status: Never   Smokeless tobacco: Never  Substance and Sexual Activity   Alcohol use: No    Alcohol/week: 0.0 standard drinks   Drug use: No   Sexual activity: Yes    Birth control/protection: None  Other Topics Concern   Not on file  Social History Narrative   Not on file   Social  Determinants of Health   Financial Resource Strain: Not on file  Food Insecurity: Not on file  Transportation Needs: Not on file  Physical Activity: Not on file  Stress: Not on file  Social Connections: Not on file  Intimate Partner Violence: Not on file    Outpatient Medications Prior to Visit  Medication Sig Dispense Refill   albuterol (VENTOLIN HFA) 108 (90 Base) MCG/ACT inhaler INHALE 2 PUFFS EVERY 6 HOURS AS NEEDED FOR WHEEZING OR SHORTNESS OF BREATH 8.5 g 1   aspirin 81 MG EC tablet TAKE 1 TABLET(81 MG) BY MOUTH DAILY 100 tablet 1   cetirizine (ZYRTEC) 10 MG tablet Take 1 tablet (10 mg total) by mouth daily. 90 tablet 1   furosemide (LASIX) 40 MG tablet TAKE 1 TABLET(40 MG) BY MOUTH DAILY 90 tablet 1   mometasone (NASONEX) 50 MCG/ACT nasal spray Place 2 sprays into the nose at bedtime. As needed. 17 g 3   montelukast (SINGULAIR) 10 MG tablet Take 1 tablet (10 mg total) by mouth at bedtime. 90 tablet 3   MULTIPLE VITAMIN PO Take 1 tablet by mouth daily.      promethazine-dextromethorphan (PROMETHAZINE-DM) 6.25-15 MG/5ML syrup Take 5 mLs  by mouth 4 (four) times daily as needed for cough. 180 mL 0   No facility-administered medications prior to visit.    No Known Allergies  ROS Review of Systems  Constitutional: Negative.   HENT: Negative.    Respiratory: Negative.    Cardiovascular: Negative.   Gastrointestinal: Negative.   Genitourinary: Negative.   Musculoskeletal: Negative.   Skin: Negative.   Allergic/Immunologic: Negative.   Neurological: Negative.   Psychiatric/Behavioral: Negative.       Objective:    Physical Exam Vitals reviewed.  Constitutional:      Appearance: Normal appearance. She is obese.  HENT:     Head: Normocephalic and atraumatic.  Eyes:     Pupils: Pupils are equal, round, and reactive to light.  Cardiovascular:     Rate and Rhythm: Normal rate and regular rhythm.     Pulses: Normal pulses.     Heart sounds: Normal heart sounds.   Pulmonary:     Effort: Pulmonary effort is normal.     Breath sounds: Normal breath sounds.  Musculoskeletal:        General: Normal range of motion.     Cervical back: Normal range of motion.  Skin:    General: Skin is warm and dry.  Neurological:     Mental Status: She is alert.  Psychiatric:        Mood and Affect: Mood normal.        Behavior: Behavior normal.    LMP 03/28/2016 (Exact Date)  Wt Readings from Last 3 Encounters:  10/27/20 261 lb 3.2 oz (118.5 kg)  11/24/19 250 lb (113.4 kg)  10/01/19 261 lb 6.4 oz (118.6 kg)     Health Maintenance Due  Topic Date Due   Pneumococcal Vaccine 24-67 Years old (1 - PCV) Never done   HIV Screening  Never done   Hepatitis C Screening  Never done   MAMMOGRAM  12/02/2016   PAP SMEAR-Modifier  02/05/2020    There are no preventive care reminders to display for this patient.  Lab Results  Component Value Date   TSH 1.57 10/31/2020   Lab Results  Component Value Date   WBC 13.0 (H) 10/31/2020   HGB 12.3 10/31/2020   HCT 38.7 10/31/2020   MCV 65.6 Repeated and verified X2. (L) 10/31/2020   PLT 324.0 10/31/2020   Lab Results  Component Value Date   NA 138 10/31/2020   K 4.0 10/31/2020   CO2 27 10/31/2020   GLUCOSE 68 (L) 10/31/2020   BUN 16 10/31/2020   CREATININE 0.87 10/31/2020   BILITOT 0.4 10/31/2020   ALKPHOS 71 10/31/2020   AST 14 10/31/2020   ALT 12 10/31/2020   PROT 6.9 10/31/2020   ALBUMIN 4.0 10/31/2020   CALCIUM 9.3 10/31/2020   ANIONGAP 7 03/16/2016   GFR 79.22 10/31/2020   Lab Results  Component Value Date   CHOL 137 10/31/2020   Lab Results  Component Value Date   HDL 49.70 10/31/2020   Lab Results  Component Value Date   LDLCALC 70 10/31/2020   Lab Results  Component Value Date   TRIG 87.0 10/31/2020   Lab Results  Component Value Date   CHOLHDL 3 10/31/2020   Lab Results  Component Value Date   HGBA1C 5.2 10/07/2014      Assessment & Plan:   Continue to follow regularly  with your PCP. No immediate needs identified today.    Follow-up: Follow up in clinic as needed.    Jerre Simon,  NP

## 2021-03-24 ENCOUNTER — Telehealth: Payer: PRIVATE HEALTH INSURANCE | Admitting: Family Medicine

## 2021-03-24 ENCOUNTER — Other Ambulatory Visit: Payer: Self-pay

## 2021-03-24 ENCOUNTER — Telehealth (INDEPENDENT_AMBULATORY_CARE_PROVIDER_SITE_OTHER): Payer: PRIVATE HEALTH INSURANCE | Admitting: Internal Medicine

## 2021-03-24 ENCOUNTER — Encounter: Payer: Self-pay | Admitting: Internal Medicine

## 2021-03-24 VITALS — BP 140/80 | Ht 66.0 in | Wt 250.0 lb

## 2021-03-24 DIAGNOSIS — U071 COVID-19: Secondary | ICD-10-CM

## 2021-03-24 MED ORDER — HYDROCODONE BIT-HOMATROP MBR 5-1.5 MG/5ML PO SOLN: 5.0000 mL | Freq: Every evening | ORAL | 0 refills | Status: DC | PRN

## 2021-03-24 MED ORDER — MOLNUPIRAVIR EUA 200MG CAPSULE: 4.0000 | ORAL_CAPSULE | Freq: Two times a day (BID) | ORAL | 0 refills | Status: AC

## 2021-03-24 NOTE — Progress Notes (Signed)
Subjective:    Patient ID: Laurie Monroe, female    DOB: 31-Aug-1971, 50 y.o.   MRN: [id number]  DOS:  03/24/2021 Type of visit - description: Virtual Visit via Telephone    I connected with above mentioned patient  by telephone and verified that I am speaking with the correct person using two identifiers.  THIS ENCOUNTER IS A VIRTUAL VISIT DUE TO COVID-19 - PATIENT WAS NOT SEEN IN THE OFFICE. PATIENT HAS CONSENTED TO VIRTUAL VISIT / TELEMEDICINE VISIT   Location of patient: home  Location of provider: office  Persons participating in the virtual visit: patient, provider   I discussed the limitations, risks, security and privacy concerns of performing an evaluation and management service by telephone and the availability of in person appointments. I also discussed with the patient that there may be a patient responsible charge related to this service. The patient expressed understanding and agreed to proceed.  Acute Symptoms a started 2 days ago with cough, it got gradually more intense and frequent. Cough was dry, she also has some sore throat. Had 2 COVID test, they were negative. Eventually today a COVID test was positive.  Denies chest pain or difficulty breathing No nausea, vomiting.  Had some diarrhea. Had a mild headache, related to cough.  No rash.   Review of Systems See above   Past Medical History:  Diagnosis Date   Allergy    Anemia    Chicken pox    Epilepsy ([place])    Hx of Epilepsy. No seizures in over 20 years   GERD (gastroesophageal reflux disease)    Leukocytosis 12/28/2015   Seizures ([place])    last episode 20 years ago   Thalassemia trait, alpha 12/28/2015   Tricuspid regurgitation     Past Surgical History:  Procedure Laterality Date   ABDOMINAL HYSTERECTOMY N/A 03/28/2016   Procedure: HYSTERECTOMY ABDOMINAL;  Surgeon: Cheri Fowler, MD;  Location: [facility];  Service: Gynecology;  Laterality: N/A;   BILATERAL SALPINGECTOMY Bilateral 03/28/2016    Procedure: BILATERAL SALPINGECTOMY;  Surgeon: Cheri Fowler, MD;  Location: [facility];  Service: Gynecology;  Laterality: Bilateral;   DILATION AND CURETTAGE OF UTERUS     Heavy Periods   VARICOSE VEIN SURGERY      Allergies as of 03/24/2021   No Known Allergies      Medication List        Accurate as of March 24, 2021  3:53 PM. If you have any questions, ask your nurse or doctor.          albuterol 108 (90 Base) MCG/ACT inhaler Commonly known as: VENTOLIN HFA INHALE 2 PUFFS EVERY 6 HOURS AS NEEDED FOR WHEEZING OR SHORTNESS OF BREATH   aspirin 81 MG EC tablet TAKE 1 TABLET(81 MG) BY MOUTH DAILY   cetirizine 10 MG tablet Commonly known as: ZYRTEC Take 1 tablet (10 mg total) by mouth daily.   furosemide 40 MG tablet Commonly known as: LASIX TAKE 1 TABLET(40 MG) BY MOUTH DAILY   HYDROcodone bit-homatropine 5-1.5 MG/5ML syrup Commonly known as: HYCODAN Take 5 mLs by mouth at bedtime as needed for cough. Started by: Kathlene November, MD   molnupiravir EUA 200 mg Caps Take 4 capsules (800 mg total) by mouth 2 (two) times daily for 5 days. Started by: Kathlene November, MD   mometasone 50 MCG/ACT nasal spray Commonly known as: Nasonex Place 2 sprays into the nose at bedtime. As needed.   montelukast 10 MG tablet Commonly known as: SINGULAIR Take  1 tablet (10 mg total) by mouth at bedtime.   MULTIPLE VITAMIN PO Take 1 tablet by mouth daily.   promethazine-dextromethorphan 6.25-15 MG/5ML syrup Commonly known as: PROMETHAZINE-DM Take 5 mLs by mouth 4 (four) times daily as needed for cough.           Objective:   Physical Exam BP 140/80   Ht '5\' 6"'$  (1.676 m)   Wt 250 lb (113.4 kg)   LMP 03/28/2016 (Exact Date)   SpO2 96%   BMI 40.35 kg/m  This was telephone evaluation, she was unable to open her camera. She sounded well, alert oriented x3, in no distress, speaking in complete sentences, mild cough noted    Assessment     [age] y/o female, h/o morbid obesity, GERD,  bronchospasm, presents w/:  Covid 19: Patient is 43, had 3 COVID vaccines, tested positive today. Vital signs are stable.  Oxygen okay. Rec rest-fluids- mucinex DM- tylenol  Observation vs meds, rec molnupiravir  (more effective than paxlovid per internal hospital data). Definitely seek medical attention if she is not gradually improving.       I discussed the assessment and treatment plan with the patient. The patient was provided an opportunity to ask questions and all were answered. The patient agreed with the plan and demonstrated an understanding of the instructions.   The patient was advised to call back or seek an in-person evaluation if the symptoms worsen or if the condition fails to improve as anticipated.  I provided 19 minutes of non-face-to-face time during this encounter.  Kathlene November, MD

## 2021-04-25 ENCOUNTER — Encounter: Payer: Self-pay | Admitting: Family Medicine

## 2021-04-25 ENCOUNTER — Ambulatory Visit: Payer: PRIVATE HEALTH INSURANCE | Admitting: Family Medicine

## 2021-04-25 ENCOUNTER — Other Ambulatory Visit: Payer: Self-pay

## 2021-04-25 VITALS — BP 112/60 | HR 100 | Temp 98.9°F | Ht 66.0 in | Wt 254.6 lb

## 2021-04-25 DIAGNOSIS — R058 Other specified cough: Secondary | ICD-10-CM | POA: Diagnosis not present

## 2021-04-25 MED ORDER — PREDNISONE 10 MG PO TABS: ORAL_TABLET | ORAL | 0 refills | Status: DC

## 2021-04-25 MED ORDER — HYDROCODONE BIT-HOMATROP MBR 5-1.5 MG/5ML PO SOLN: 5.0000 mL | Freq: Every evening | ORAL | 0 refills | Status: DC | PRN

## 2021-04-25 NOTE — Assessment & Plan Note (Signed)
pred taper  Cough meds rto prn

## 2021-04-25 NOTE — Progress Notes (Signed)
Subjective:   By signing my name below, I, Laurie Monroe, attest that this documentation has been prepared under the direction and in the presence of Dr. Roma Schanz, DO. 04/25/2021    Patient ID: Laurie Monroe, female    DOB: Oct 15, 1970, 50 y.o.   MRN: [id number]  Chief Complaint  Patient presents with   post covid cough    Got covid 03/29/21 and the cough get better while on the medications but now worse now that she is off the cough syrup.     HPI Patient is in today for a office visit.  She complains of a dry cough throughout the entire day. She recently contracted Covid-19 and recovered from most of her symptoms except her cough. She has tried OTC cough syrup, 5 ml hydrocodone PRN and found relief. She denies having any chest tightness or wheezing at this time. She has tried tessalon perles previously for a cough and found no relief.  She also complains of chest congestion. She frequently is clearing her throat throughout the day which causes her to cough as well.  Her blood pressure is doing well during this visit. She continues taking 40 mg lasix daily PO and reports no new issues while taking it.   BP Readings from Last 3 Encounters:  04/25/21 112/60  03/24/21 140/80  02/28/21 (!) 148/78    Past Medical History:  Diagnosis Date   Allergy    Anemia    Chicken pox    Epilepsy ([place])    Hx of Epilepsy. 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   Tricuspid regurgitation     Past Surgical History:  Procedure Laterality Date   ABDOMINAL HYSTERECTOMY N/A 03/28/2016   Procedure: HYSTERECTOMY ABDOMINAL;  Surgeon: Cheri Fowler, MD;  Location: [facility];  Service: Gynecology;  Laterality: N/A;   BILATERAL SALPINGECTOMY Bilateral 03/28/2016   Procedure: BILATERAL SALPINGECTOMY;  Surgeon: Cheri Fowler, MD;  Location: [facility];  Service:  Gynecology;  Laterality: Bilateral;   DILATION AND CURETTAGE OF UTERUS     Heavy Periods   VARICOSE VEIN SURGERY      Family History  Problem Relation Age of Onset   Hypertension Mother    Prostate cancer Father    Hypertension Father    Liver cancer Father    Stomach cancer Father    Heart disease Father    Hypertension Brother        Twin Brother   Heart disease Brother 46       coronary stent    Social History   Socioeconomic History   Marital status: Single    Spouse name: Not on file   Number of children: Not on file   Years of education: Not on file   Highest education level: Not on file  Occupational History   Occupation: CNA    Employer: [employer]  Tobacco Use   Smoking status: Never   Smokeless tobacco: Never  Substance and Sexual Activity   Alcohol use: No    Alcohol/week: 0.0 standard drinks   Drug use: No   Sexual activity: Yes    Birth control/protection: None  Other Topics Concern   Not on file  Social History Narrative   Not on file   Social Determinants of Health   Financial Resource Strain: Not on file  Food Insecurity: Not  on file  Transportation Needs: Not on file  Physical Activity: Not on file  Stress: Not on file  Social Connections: Not on file  Intimate Partner Violence: Not on file    Outpatient Medications Prior to Visit  Medication Sig Dispense Refill   albuterol (VENTOLIN HFA) 108 (90 Base) MCG/ACT inhaler INHALE 2 PUFFS EVERY 6 HOURS AS NEEDED FOR WHEEZING OR SHORTNESS OF BREATH 8.5 g 1   cetirizine (ZYRTEC) 10 MG tablet Take 1 tablet (10 mg total) by mouth daily. 90 tablet 1   furosemide (LASIX) 40 MG tablet TAKE 1 TABLET(40 MG) BY MOUTH DAILY 90 tablet 1   mometasone (NASONEX) 50 MCG/ACT nasal spray Place 2 sprays into the nose at bedtime. As needed. 17 g 3   montelukast (SINGULAIR) 10 MG tablet Take 1 tablet (10 mg total) by mouth at bedtime. 90 tablet 3   MULTIPLE VITAMIN PO Take 1 tablet by mouth daily.      aspirin  81 MG EC tablet TAKE 1 TABLET(81 MG) BY MOUTH DAILY (Patient not taking: Reported on 04/25/2021) 100 tablet 1   promethazine-dextromethorphan (PROMETHAZINE-DM) 6.25-15 MG/5ML syrup Take 5 mLs by mouth 4 (four) times daily as needed for cough. (Patient not taking: No sig reported) 180 mL 0   HYDROcodone bit-homatropine (HYCODAN) 5-1.5 MG/5ML syrup Take 5 mLs by mouth at bedtime as needed for cough. (Patient not taking: Reported on 04/25/2021) 90 mL 0   No facility-administered medications prior to visit.    No Known Allergies  Review of Systems  Constitutional:  Negative for fever and malaise/fatigue.  HENT:  Negative for congestion.   Eyes:  Negative for blurred vision.  Respiratory:  Positive for cough. Negative for shortness of breath.        (+)chest congestion  Cardiovascular:  Negative for chest pain, palpitations and leg swelling.  Gastrointestinal:  Negative for abdominal pain, blood in stool and nausea.  Genitourinary:  Negative for dysuria and frequency.  Musculoskeletal:  Negative for falls.  Skin:  Negative for rash.  Neurological:  Negative for dizziness, loss of consciousness and headaches.  Endo/Heme/Allergies:  Negative for environmental allergies.  Psychiatric/Behavioral:  Negative for depression. The patient is not nervous/anxious.       Objective:    Physical Exam Vitals and nursing note reviewed.  Constitutional:      General: She is not in acute distress.    Appearance: Normal appearance. She is not ill-appearing.  HENT:     Head: Normocephalic and atraumatic.     Right Ear: External ear normal.     Left Ear: External ear normal.  Eyes:     Extraocular Movements: Extraocular movements intact.     Pupils: Pupils are equal, round, and reactive to light.  Cardiovascular:     Rate and Rhythm: Normal rate and regular rhythm.     Heart sounds: Normal heart sounds. No murmur heard.   No gallop.  Pulmonary:     Effort: Pulmonary effort is normal. No respiratory  distress.     Breath sounds: Normal breath sounds. No wheezing or rales.  Skin:    General: Skin is warm and dry.  Neurological:     Mental Status: She is alert and oriented to person, place, and time.  Psychiatric:        Behavior: Behavior normal.        Judgment: Judgment normal.    BP 112/60   Pulse 100   Temp 98.9 F (37.2 C) (Oral)   Ht '5\' 6"'$  (  1.676 m)   Wt 254 lb 9.6 oz (115.5 kg)   LMP 03/28/2016 (Exact Date)   SpO2 99%   BMI 41.09 kg/m  Wt Readings from Last 3 Encounters:  04/25/21 254 lb 9.6 oz (115.5 kg)  03/24/21 250 lb (113.4 kg)  02/28/21 252 lb (114.3 kg)    Diabetic Foot Exam - Simple   No data filed    Lab Results  Component Value Date   WBC 13.0 (H) 10/31/2020   HGB 12.3 10/31/2020   HCT 38.7 10/31/2020   PLT 324.0 10/31/2020   GLUCOSE 68 (L) 10/31/2020   CHOL 137 10/31/2020   TRIG 87.0 10/31/2020   HDL 49.70 10/31/2020   LDLCALC 70 10/31/2020   ALT 12 10/31/2020   AST 14 10/31/2020   NA 138 10/31/2020   K 4.0 10/31/2020   CL 104 10/31/2020   CREATININE 0.87 10/31/2020   BUN 16 10/31/2020   CO2 27 10/31/2020   TSH 1.57 10/31/2020   HGBA1C 5.2 10/07/2014   MICROALBUR 0.7 11/21/2015    Lab Results  Component Value Date   TSH 1.57 10/31/2020   Lab Results  Component Value Date   WBC 13.0 (H) 10/31/2020   HGB 12.3 10/31/2020   HCT 38.7 10/31/2020   MCV 65.6 Repeated and verified X2. (L) 10/31/2020   PLT 324.0 10/31/2020   Lab Results  Component Value Date   NA 138 10/31/2020   K 4.0 10/31/2020   CO2 27 10/31/2020   GLUCOSE 68 (L) 10/31/2020   BUN 16 10/31/2020   CREATININE 0.87 10/31/2020   BILITOT 0.4 10/31/2020   ALKPHOS 71 10/31/2020   AST 14 10/31/2020   ALT 12 10/31/2020   PROT 6.9 10/31/2020   ALBUMIN 4.0 10/31/2020   CALCIUM 9.3 10/31/2020   ANIONGAP 7 03/16/2016   GFR 79.22 10/31/2020   Lab Results  Component Value Date   CHOL 137 10/31/2020   Lab Results  Component Value Date   HDL 49.70 10/31/2020    Lab Results  Component Value Date   LDLCALC 70 10/31/2020   Lab Results  Component Value Date   TRIG 87.0 10/31/2020   Lab Results  Component Value Date   CHOLHDL 3 10/31/2020   Lab Results  Component Value Date   HGBA1C 5.2 10/07/2014       Assessment & Plan:   Problem List Items Addressed This Visit   None Visit Diagnoses     Post-viral cough syndrome    -  Primary   Relevant Medications   HYDROcodone bit-homatropine (HYCODAN) 5-1.5 MG/5ML syrup   predniSONE (DELTASONE) 10 MG tablet        Meds ordered this encounter  Medications   HYDROcodone bit-homatropine (HYCODAN) 5-1.5 MG/5ML syrup    Sig: Take 5 mLs by mouth at bedtime as needed for cough.    Dispense:  90 mL    Refill:  0   predniSONE (DELTASONE) 10 MG tablet    Sig: TAKE 3 TABLETS PO QD FOR 3 DAYS THEN TAKE 2 TABLETS PO QD FOR 3 DAYS THEN TAKE 1 TABLET PO QD FOR 3 DAYS THEN TAKE 1/2 TAB PO QD FOR 3 DAYS    Dispense:  20 tablet    Refill:  0    I, Dr. Roma Schanz, DO, personally preformed the services described in this documentation.  All medical record entries made by the scribe were at my direction and in my presence.  I have reviewed the chart and discharge instructions (if applicable) and  agree that the record reflects my personal performance and is accurate and complete. 04/25/2021   I,Laurie Monroe,acting as a scribe for [name], DO.,have documented all relevant documentation on the behalf of [name], DO,as directed by  [name], DO while in the presence of [name], DO.   [name], DO

## 2021-04-25 NOTE — Patient Instructions (Signed)

## 2021-09-05 ENCOUNTER — Other Ambulatory Visit: Payer: Self-pay | Admitting: Family Medicine

## 2021-09-05 DIAGNOSIS — J302 Other seasonal allergic rhinitis: Secondary | ICD-10-CM

## 2021-11-15 ENCOUNTER — Other Ambulatory Visit: Payer: Self-pay | Admitting: Family Medicine

## 2021-11-15 DIAGNOSIS — J302 Other seasonal allergic rhinitis: Secondary | ICD-10-CM

## 2021-12-18 ENCOUNTER — Other Ambulatory Visit: Payer: Self-pay | Admitting: Family Medicine

## 2021-12-18 DIAGNOSIS — J302 Other seasonal allergic rhinitis: Secondary | ICD-10-CM

## 2021-12-19 ENCOUNTER — Other Ambulatory Visit: Payer: Self-pay | Admitting: Family Medicine

## 2021-12-19 DIAGNOSIS — J302 Other seasonal allergic rhinitis: Secondary | ICD-10-CM

## 2021-12-21 ENCOUNTER — Ambulatory Visit: Payer: PRIVATE HEALTH INSURANCE | Admitting: Family Medicine

## 2021-12-21 ENCOUNTER — Encounter: Payer: Self-pay | Admitting: Family Medicine

## 2021-12-21 VITALS — BP 110/78 | HR 92 | Temp 98.6°F | Resp 20 | Ht 66.0 in | Wt 262.0 lb

## 2021-12-21 DIAGNOSIS — M791 Myalgia, unspecified site: Secondary | ICD-10-CM | POA: Diagnosis not present

## 2021-12-21 MED ORDER — MELOXICAM 15 MG PO TABS: 15.0000 mg | ORAL_TABLET | Freq: Every day | ORAL | 3 refills | Status: DC

## 2021-12-21 NOTE — Progress Notes (Signed)
? ?Established Patient Office Visit ? ?Subjective   ?Patient ID: Laurie Monroe, female    DOB: 1971/08/18  Age: 51 y.o. MRN: [id number] ? ?Chief Complaint  ?Patient presents with  ? Body pain  ?  Pt states working has a [employer] and does a lot of heavy lifting. Pt states this past Tuesday she caught a patient from falling. Pt states having body pain prior to the incident. Pt states having burning on the bottom of her right foot.   ? ? ?HPI ? ?Pt is here c/o body aches---- she caught a pt that almost fell when she started having a seizure   she has been struggling with body aches and myalgias but they got worse after .     ?No specific incident comes to mind but she is a cma at [employer] .   ? ?Review of Systems  ?Constitutional:  Negative for fever and malaise/fatigue.  ?HENT:  Negative for congestion.   ?Eyes:  Negative for blurred vision.  ?Respiratory:  Negative for cough and shortness of breath.   ?Cardiovascular:  Negative for chest pain, palpitations and leg swelling.  ?Gastrointestinal:  Negative for vomiting.  ?Musculoskeletal:  Positive for joint pain and myalgias. Negative for back pain and falls.  ?Skin:  Negative for rash.  ?Neurological:  Negative for loss of consciousness and headaches.  ? ?  ?Objective:  ?  ? ?BP 110/78 (BP Location: Left Arm, Patient Position: Sitting, Cuff Size: Large)   Pulse 92   Temp 98.6 ?F (37 ?C) (Oral)   Resp 20   Ht '5\' 6"'$  (1.676 m)   Wt 262 lb (118.8 kg)   LMP 03/28/2016 (Exact Date)   SpO2 97%   BMI 42.29 kg/m?  ?BP Readings from Last 3 Encounters:  ?12/21/21 110/78  ?04/25/21 112/60  ?03/24/21 140/80  ? ?Wt Readings from Last 3 Encounters:  ?12/21/21 262 lb (118.8 kg)  ?04/25/21 254 lb 9.6 oz (115.5 kg)  ?03/24/21 250 lb (113.4 kg)  ? ?SpO2 Readings from Last 3 Encounters:  ?12/21/21 97%  ?04/25/21 99%  ?03/24/21 96%  ? ?  ? ?Physical Exam ?Vitals and nursing note reviewed.  ?Constitutional:   ?   Appearance: She is well-developed.  ?HENT:  ?   Head: Normocephalic and  atraumatic.  ?Eyes:  ?   Conjunctiva/sclera: Conjunctivae normal.  ?Neck:  ?   Thyroid: No thyromegaly.  ?   Vascular: No carotid bruit or JVD.  ?Cardiovascular:  ?   Rate and Rhythm: Normal rate and regular rhythm.  ?   Heart sounds: Normal heart sounds. No murmur heard. ?Pulmonary:  ?   Effort: Pulmonary effort is normal. No respiratory distress.  ?   Breath sounds: Normal breath sounds. No wheezing or rales.  ?Chest:  ?   Chest wall: No tenderness.  ?Musculoskeletal:     ?   General: No swelling, tenderness or signs of injury.  ?   Cervical back: Normal range of motion and neck supple.  ?   Right lower leg: No edema.  ?   Left lower leg: No edema.  ?Neurological:  ?   General: No focal deficit present.  ?   Mental Status: She is alert and oriented to person, place, and time.  ?   Motor: No weakness.  ?   Gait: Gait normal.  ?   Deep Tendon Reflexes: Reflexes normal.  ?Psychiatric:     ?   Mood and Affect: Mood normal.     ?  Behavior: Behavior normal.     ?   Thought Content: Thought content normal.     ?   Judgment: Judgment normal.  ? ? ? ?No results found for any visits on 12/21/21. ? ?Last CBC ?Lab Results  ?Component Value Date  ? WBC 13.0 (H) 10/31/2020  ? HGB 12.3 10/31/2020  ? HCT 38.7 10/31/2020  ? MCV 65.6 Repeated and verified X2. (L) 10/31/2020  ? MCH 21.8 (L) 12/28/2016  ? RDW 14.9 10/31/2020  ? PLT 324.0 10/31/2020  ? ?Last metabolic panel ?Lab Results  ?Component Value Date  ? GLUCOSE 68 (L) 10/31/2020  ? NA 138 10/31/2020  ? K 4.0 10/31/2020  ? CL 104 10/31/2020  ? CO2 27 10/31/2020  ? BUN 16 10/31/2020  ? CREATININE 0.87 10/31/2020  ? GFRNONAA 103 10/26/2016  ? CALCIUM 9.3 10/31/2020  ? PROT 6.9 10/31/2020  ? ALBUMIN 4.0 10/31/2020  ? LABGLOB 3.1 10/26/2016  ? AGRATIO 1.3 10/26/2016  ? BILITOT 0.4 10/31/2020  ? ALKPHOS 71 10/31/2020  ? AST 14 10/31/2020  ? ALT 12 10/31/2020  ? ANIONGAP 7 03/16/2016  ? ?Last lipids ?Lab Results  ?Component Value Date  ? CHOL 137 10/31/2020  ? HDL 49.70 10/31/2020   ? [place] 70 10/31/2020  ? TRIG 87.0 10/31/2020  ? CHOLHDL 3 10/31/2020  ? ?Last hemoglobin A1c ?Lab Results  ?Component Value Date  ? HGBA1C 5.2 10/07/2014  ? ?Last thyroid functions ?Lab Results  ?Component Value Date  ? TSH 1.57 10/31/2020  ? ?Last vitamin D ?No results found for: 25OHVITD2, 25OHVITD3, VD25OH ?  ? ?The 10-year ASCVD risk score (Arnett DK, et al., 2019) is: 2.5% ? ?  ?Assessment & Plan:  ? ?Problem List Items Addressed This Visit   ? ?  ? Unprioritized  ? Myalgia - Primary  ?  Check labs  ?mobic 15 mg 1/2-1 po qd prn  ?rto prn  ? ?  ?  ? Relevant Medications  ? meloxicam (MOBIC) 15 MG tablet  ? Other Relevant Orders  ? CBC with Differential/Platelet  ? Comprehensive metabolic panel  ? Lipid panel  ? TSH  ? VITAMIN D 25 Hydroxy (Vit-D Deficiency, Fractures)  ? Antinuclear Antib (ANA)  ? Rheumatoid Factor  ? Sedimentation rate  ? ? ?Return if symptoms worsen or fail to improve.  ? ? ?[name], DO ? ?

## 2021-12-21 NOTE — Patient Instructions (Signed)
Muscle Pain, Adult Muscle pain, also called myalgia, is a condition in which a person has pain in one or more muscles in the body. Muscle pain may be mild, moderate, or severe. It may feel sharp, achy, or burning. In most cases, the pain lasts only a short time and goes away without treatment. Muscle pain can result from using muscles in a new or different way or after a period of inactivity. It is normal to feel some muscle pain after starting an exercise program. Muscles that have not been used often will be sore at first. What are the causes? This condition is caused by using muscles in a new or different way after a period of inactivity. Other causes may include: Overuse or muscle strain, especially if you are not in shape. This is the most common cause of muscle pain. Injury or bruising. Infectious diseases, including diseases caused by viruses, such as the flu (influenza). Fibromyalgia.This is a long-term, or chronic, condition that causes muscle tenderness, tiredness (fatigue), and headache. Autoimmune or rheumatologic diseases. These are conditions, such as lupus, in which the body's defense system (immunesystem) attacks areas in the body. Certain medicines, including ACE inhibitors and statins. What are the signs or symptoms? The main symptom of this condition is sore or painful muscles, including during activity and when stretching. You may also have slight swelling. How is this diagnosed? This condition is diagnosed with a physical exam. Your health care provider will ask questions about your pain and when it began. If you have not had muscle pain for very long, your health care provider may want to wait before doing much testing. If your muscle pain has lasted a long time, tests may be done right away. In some cases, this may include tests to rule out certain conditions or illnesses. How is this treated? Treatment for this condition depends on the cause. Home care is often enough to  relieve muscle pain. Your health care provider may also prescribe NSAIDs, such as ibuprofen. Follow these instructions at home: Medicines Take over-the-counter and prescription medicines only as told by your health care provider. Ask your health care provider if the medicine prescribed to you requires you to avoid driving or using machinery. Managing pain, swelling, and discomfort     If directed, put ice on the painful area. To do this: Put ice in a plastic bag. Place a towel between your skin and the bag. Leave the ice on for 20 minutes, 2-3 times a day. For the first 2 days of muscle soreness, or if there is swelling: Do not soak in hot baths. Do not use a hot tub, steam room, sauna, heating pad, or other heat source. After 48-72 hours, you may alternate between applying ice and applying heat as told by your health care provider. If directed, apply heat to the affected area as often as told by your health care provider. Use the heat source that your health care provider recommends, such as a moist heat pack or a heating pad. Place a towel between your skin and the heat source. Leave the heat on for 20-30 minutes. Remove the heat if your skin turns bright red. This is especially important if you are unable to feel pain, heat, or cold. You may have a greater risk of getting burned. If you have an injury, raise (elevate) the injured area above the level of your heart while you are sitting or lying down. Activity  If overuse is causing your muscle pain: Slow down   your activities until the pain goes away. Do regular, gentle exercises if you are not usually active. Warm up before exercising. Stretch before and after exercising. This can help lower the risk of muscle pain. Do not continue working out if the pain is severe. Severe pain could mean that you have injured a muscle. Do not lift anything that is heavier than 5-10 lb (2.3-4.5 kg), or the limit that you are told, until your health  care provider says that it is safe. Return to your normal activities as told by your health care provider. Ask your health care provider what activities are safe for you. General instructions Do not use any products that contain nicotine or tobacco, such as cigarettes, e-cigarettes, and chewing tobacco. These can delay healing. If you need help quitting, ask your health care provider. Keep all follow-up visits as told by your health care provider. This is important. Contact a health care provider if you have: Muscle pain that gets worse and medicines do not help. Muscle pain that lasts longer than 3 days. A rash or fever along with muscle pain. Muscle pain after a tick bite. Muscle pain while working out, even though you are in good physical condition. Redness, soreness, or swelling along with muscle pain. Muscle pain after starting a new medicine or changing the dose of a medicine. Get help right away if you have: Trouble breathing. Trouble swallowing. Muscle pain along with a stiff neck, fever, and vomiting. Severe muscle weakness or you cannot move part of your body. These symptoms may represent a serious problem that is an emergency. Do not wait to see if the symptoms will go away. Get medical help right away. Call your local emergency services (911 in the [place] Do not drive yourself to the hospital. Summary Muscle pain usually lasts only a short time and goes away without treatment. This condition is caused by using muscles in a new or different way after a period of inactivity. If your muscle pain lasts longer than 3 days, tell your health care provider. This information is not intended to replace advice given to you by your health care provider. Make sure you discuss any questions you have with your health care provider. Document Revised: 03/10/2021 Document Reviewed: 03/10/2021 Elsevier Patient Education  2023 [employer]  

## 2021-12-21 NOTE — Assessment & Plan Note (Signed)
Check labs  ?mobic 15 mg 1/2-1 po qd prn  ?rto prn  ?

## 2021-12-22 LAB — LIPID PANEL
Cholesterol: 140 mg/dL (ref 0–200)
HDL: 49.7 mg/dL (ref >39.00)
LDL Cholesterol: 77 mg/dL (ref 0–99)
NonHDL: 90.32
Total CHOL/HDL Ratio: 3
Triglycerides: 68 mg/dL (ref 0.0–149.0)
VLDL: 13.6 mg/dL (ref 0.0–40.0)

## 2021-12-22 LAB — CBC WITH DIFFERENTIAL/PLATELET
Basophils Absolute: 0.1 10*3/uL (ref 0.0–0.1)
Basophils Relative: 0.8 % (ref 0.0–3.0)
Eosinophils Absolute: 0.2 10*3/uL (ref 0.0–0.7)
Eosinophils Relative: 1.9 % (ref 0.0–5.0)
HCT: 39.3 % (ref 36.0–46.0)
Hemoglobin: 12.3 g/dL (ref 12.0–15.0)
Lymphocytes Relative: 27.6 % (ref 12.0–46.0)
Lymphs Abs: 3.3 10*3/uL (ref 0.7–4.0)
MCHC: 31.3 g/dL (ref 30.0–36.0)
MCV: 65.6 fl — ABNORMAL LOW (ref 78.0–100.0)
Monocytes Absolute: 1 10*3/uL (ref 0.1–1.0)
Monocytes Relative: 8.2 % (ref 3.0–12.0)
Neutro Abs: 7.2 10*3/uL (ref 1.4–7.7)
Neutrophils Relative %: 61.5 % (ref 43.0–77.0)
Platelets: 302 10*3/uL (ref 150.0–400.0)
RBC: 5.99 Mil/uL — ABNORMAL HIGH (ref 3.87–5.11)
RDW: 14.8 % (ref 11.5–15.5)
WBC: 11.8 10*3/uL — ABNORMAL HIGH (ref 4.0–10.5)

## 2021-12-22 LAB — COMPREHENSIVE METABOLIC PANEL
ALT: 12 U/L (ref 0–35)
AST: 15 U/L (ref 0–37)
Albumin: 4 g/dL (ref 3.5–5.2)
Alkaline Phosphatase: 66 U/L (ref 39–117)
BUN: 15 mg/dL (ref 6–23)
CO2: 25 mEq/L (ref 19–32)
Calcium: 8.9 mg/dL (ref 8.4–10.5)
Chloride: 105 mEq/L (ref 96–112)
Creatinine, Ser: 1.02 mg/dL (ref 0.40–1.20)
GFR: 64.93 mL/min (ref >60.00)
Glucose, Bld: 86 mg/dL (ref 70–99)
Potassium: 4.4 mEq/L (ref 3.5–5.1)
Sodium: 138 mEq/L (ref 135–145)
Total Bilirubin: 0.3 mg/dL (ref 0.2–1.2)
Total Protein: 7 g/dL (ref 6.0–8.3)

## 2021-12-22 LAB — TSH: TSH: 1.45 u[IU]/mL (ref 0.35–5.50)

## 2021-12-22 LAB — RHEUMATOID FACTOR: Rheumatoid fact SerPl-aCnc: <14 IU/mL (ref <14)

## 2021-12-22 LAB — VITAMIN D 25 HYDROXY (VIT D DEFICIENCY, FRACTURES): VITD: 16.38 ng/mL — ABNORMAL LOW (ref 30.00–100.00)

## 2021-12-22 LAB — ANA: Anti Nuclear Antibody (ANA): NEGATIVE

## 2021-12-22 LAB — SEDIMENTATION RATE: Sed Rate: 15 mm/hr (ref 0–20)

## 2021-12-27 ENCOUNTER — Other Ambulatory Visit: Payer: Self-pay

## 2021-12-27 MED ORDER — VITAMIN D (ERGOCALCIFEROL) 1.25 MG (50000 UNIT) PO CAPS: 50000.0000 [IU] | ORAL_CAPSULE | ORAL | 1 refills | Status: DC

## 2022-01-09 ENCOUNTER — Encounter: Payer: Self-pay | Admitting: Family Medicine

## 2022-01-09 ENCOUNTER — Ambulatory Visit (INDEPENDENT_AMBULATORY_CARE_PROVIDER_SITE_OTHER): Payer: PRIVATE HEALTH INSURANCE | Admitting: Family Medicine

## 2022-01-09 VITALS — BP 124/72 | HR 78 | Temp 98.4°F | Resp 18 | Ht 66.0 in | Wt 263.2 lb

## 2022-01-09 DIAGNOSIS — E559 Vitamin D deficiency, unspecified: Secondary | ICD-10-CM

## 2022-01-09 DIAGNOSIS — Z1159 Encounter for screening for other viral diseases: Secondary | ICD-10-CM | POA: Diagnosis not present

## 2022-01-09 DIAGNOSIS — Z0000 Encounter for general adult medical examination without abnormal findings: Secondary | ICD-10-CM

## 2022-01-09 DIAGNOSIS — I10 Essential (primary) hypertension: Secondary | ICD-10-CM

## 2022-01-09 NOTE — Patient Instructions (Signed)

## 2022-01-09 NOTE — Progress Notes (Signed)
? ?Subjective:  ? ?By signing my name below, I, [name], attest that this documentation has been prepared under the direction and in the presence of [name], DO. 01/09/2022 ? ? ? Patient ID: Laurie Monroe, female    DOB: [birth year], 51 y.o.   MRN: [id number] ? ?Chief Complaint  ?Patient presents with  ? Annual Exam  ?  Pt states not fasting   ? ? ?HPI ?Patient is in today for a comprehensive physical exam.  ? ?She has not started taking vitamin D supplements since her last lab work due to not being informed she needed to take it. ?She does not see her cardiologist regularly.  ?She denies having any fever, new muscle pain, new joint pain, new moles, congestion, sinus pain, sore throat, chest pain, palpations, cough, SOB, wheezing, n/v/d, constipation, blood in stool, dysuria, frequency, hematuria, or headaches at this time. ?She has no change in family medical history. She has no recent surgical procedures.  ?She has 2 moderna and 1 pfizer Covid-19 vaccines.  ?She does not participates in regular exercise at this time.  ?She is due for vision care. Her last visit was 2 years ago. She is planning on scheduling an appointment.  ?She is UTD on dental care.  ? ? ?Past Medical History:  ?Diagnosis Date  ? Allergy   ? Anemia   ? Chicken pox   ? Epilepsy ([place])   ? Hx of Epilepsy. No seizures in over 20 years  ? GERD (gastroesophageal reflux disease)   ? Leukocytosis 12/28/2015  ? Seizures ([place])   ? last episode 20 years ago  ? Thalassemia trait, alpha 12/28/2015  ? Tricuspid regurgitation   ? ? ?Past Surgical History:  ?Procedure Laterality Date  ? ABDOMINAL HYSTERECTOMY N/A 03/28/2016  ? Procedure: HYSTERECTOMY ABDOMINAL;  Surgeon: Cheri Fowler, MD;  Location: [facility];  Service: Gynecology;  Laterality: N/A;  ? BILATERAL SALPINGECTOMY Bilateral 03/28/2016  ? Procedure: BILATERAL SALPINGECTOMY;  Surgeon: Cheri Fowler, MD;  Location: [facility];  Service: Gynecology;  Laterality: Bilateral;  ? DILATION AND  CURETTAGE OF UTERUS    ? Heavy Periods  ? VARICOSE VEIN SURGERY    ? ? ?Family History  ?Problem Relation Age of Onset  ? Hypertension Mother   ? Prostate cancer Father   ? Hypertension Father   ? Liver cancer Father   ? Stomach cancer Father   ? Heart disease Father   ? Hypertension Brother   ?     Twin Brother  ? Heart disease Brother 36  ?     coronary stent  ? ? ?Social History  ? ?Socioeconomic History  ? Marital status: Single  ?  Spouse name: Not on file  ? Number of children: Not on file  ? Years of education: Not on file  ? Highest education level: Not on file  ?Occupational History  ? Occupation: CNA  ?  Employer: [employer]  ?Tobacco Use  ? Smoking status: Never  ? Smokeless tobacco: Never  ?Substance and Sexual Activity  ? Alcohol use: No  ?  Alcohol/week: 0.0 standard drinks  ? Drug use: No  ? Sexual activity: Yes  ?  Birth control/protection: None  ?Other Topics Concern  ? Not on file  ?Social History Narrative  ? Exercise ---- no except walking at work   ? Lives with her daughter   ? ?Social Determinants of Health  ? ?Financial Resource Strain: Not on file  ?Food Insecurity: Not on file  ?  Transportation Needs: Not on file  ?Physical Activity: Not on file  ?Stress: Not on file  ?Social Connections: Not on file  ?Intimate Partner Violence: Not on file  ? ? ?Outpatient Medications Prior to Visit  ?Medication Sig Dispense Refill  ? albuterol (VENTOLIN HFA) 108 (90 Base) MCG/ACT inhaler INHALE 2 PUFFS EVERY 6 HOURS AS NEEDED FOR WHEEZING OR SHORTNESS OF BREATH 8.5 g 1  ? cetirizine (ZYRTEC) 10 MG tablet TAKE 1 TABLET(10 MG) BY MOUTH DAILY 90 tablet 1  ? furosemide (LASIX) 40 MG tablet TAKE 1 TABLET(40 MG) BY MOUTH DAILY 90 tablet 1  ? meloxicam (MOBIC) 15 MG tablet Take 1 tablet (15 mg total) by mouth daily. 30 tablet 3  ? mometasone (NASONEX) 50 MCG/ACT nasal spray Place 2 sprays into the nose at bedtime. As needed. 17 g 3  ? montelukast (SINGULAIR) 10 MG tablet TAKE 1 TABLET(10 MG) BY MOUTH AT BEDTIME  30 tablet 0  ? MULTIPLE VITAMIN PO Take 1 tablet by mouth daily.     ? HYDROcodone bit-homatropine (HYCODAN) 5-1.5 MG/5ML syrup Take 5 mLs by mouth at bedtime as needed for cough. (Patient not taking: Reported on 01/09/2022) 90 mL 0  ? predniSONE (DELTASONE) 10 MG tablet TAKE 3 TABLETS PO QD FOR 3 DAYS THEN TAKE 2 TABLETS PO QD FOR 3 DAYS THEN TAKE 1 TABLET PO QD FOR 3 DAYS THEN TAKE 1/2 TAB PO QD FOR 3 DAYS (Patient not taking: Reported on 01/09/2022) 20 tablet 0  ? Vitamin D, Ergocalciferol, (DRISDOL) 1.25 MG (50000 UNIT) CAPS capsule Take 1 capsule (50,000 Units total) by mouth every 7 (seven) days. (Patient not taking: Reported on 01/09/2022) 12 capsule 1  ? ?No facility-administered medications prior to visit.  ? ? ?No Known Allergies ? ?Review of Systems  ?Constitutional:  Negative for fever and malaise/fatigue.  ?HENT:  Negative for congestion.   ?Eyes:  Negative for blurred vision.  ?Respiratory:  Negative for shortness of breath.   ?Cardiovascular:  Negative for chest pain, palpitations and leg swelling.  ?Gastrointestinal:  Negative for abdominal pain, blood in stool and nausea.  ?Genitourinary:  Negative for dysuria and frequency.  ?Musculoskeletal:  Negative for falls.  ?Skin:  Negative for rash.  ?Neurological:  Negative for dizziness, loss of consciousness and headaches.  ?Endo/Heme/Allergies:  Negative for environmental allergies.  ?Psychiatric/Behavioral:  Negative for depression. The patient is not nervous/anxious.   ? ?   ?Objective:  ?  ?Physical Exam ?Vitals and nursing note reviewed.  ?Constitutional:   ?   General: She is not in acute distress. ?   Appearance: She is well-developed. She is not diaphoretic.  ?HENT:  ?   Head: Normocephalic and atraumatic.  ?   Right Ear: External ear normal.  ?   Left Ear: External ear normal.  ?   Nose: Nose normal.  ?Eyes:  ?   General:     ?   Right eye: No discharge.     ?   Left eye: No discharge.  ?   Conjunctiva/sclera: Conjunctivae normal.  ?   Pupils: Pupils  are equal, round, and reactive to light.  ?Neck:  ?   Thyroid: No thyromegaly.  ?   Vascular: No carotid bruit or JVD.  ?Cardiovascular:  ?   Rate and Rhythm: Normal rate and regular rhythm.  ?   Heart sounds: Normal heart sounds. No murmur heard. ?Pulmonary:  ?   Effort: Pulmonary effort is normal. No respiratory distress.  ?   Breath sounds:  Normal breath sounds. No wheezing or rales.  ?Chest:  ?   Chest wall: No tenderness.  ?Abdominal:  ?   General: Bowel sounds are normal. There is no distension.  ?   Palpations: Abdomen is soft. There is no mass.  ?   Tenderness: There is no abdominal tenderness. There is no guarding or rebound.  ?Genitourinary: ?   Vagina: Normal. No vaginal discharge.  ?   Rectum: Guaiac result negative.  ?Musculoskeletal:     ?   General: No tenderness. Normal range of motion.  ?   Cervical back: Normal range of motion and neck supple.  ?Lymphadenopathy:  ?   Cervical: No cervical adenopathy.  ?Skin: ?   General: Skin is warm and dry.  ?   Findings: No erythema or rash.  ?Neurological:  ?   Mental Status: She is alert and oriented to person, place, and time.  ?   Cranial Nerves: No cranial nerve deficit.  ?   Deep Tendon Reflexes: Reflexes are normal and symmetric.  ?Psychiatric:     ?   Behavior: Behavior normal.     ?   Thought Content: Thought content normal.     ?   Judgment: Judgment normal.  ? ? ?BP 124/72 (BP Location: Left Arm, Patient Position: Sitting, Cuff Size: Large)   Pulse 78   Temp 98.4 ?F (36.9 ?C) (Oral)   Resp 18   Ht '5\' 6"'$  (1.676 m)   Wt 263 lb 3.2 oz (119.4 kg)   LMP 03/28/2016 (Exact Date)   SpO2 96%   BMI 42.48 kg/m?  ?Wt Readings from Last 3 Encounters:  ?01/09/22 263 lb 3.2 oz (119.4 kg)  ?12/21/21 262 lb (118.8 kg)  ?04/25/21 254 lb 9.6 oz (115.5 kg)  ? ? ?Diabetic Foot Exam - Simple   ?No data filed ?  ? ?Lab Results  ?Component Value Date  ? WBC 11.8 (H) 12/21/2021  ? HGB 12.3 12/21/2021  ? HCT 39.3 12/21/2021  ? PLT 302.0 12/21/2021  ? GLUCOSE 86  12/21/2021  ? CHOL 140 12/21/2021  ? TRIG 68.0 12/21/2021  ? HDL 49.70 12/21/2021  ? [place] 77 12/21/2021  ? ALT 12 12/21/2021  ? AST 15 12/21/2021  ? NA 138 12/21/2021  ? K 4.4 12/21/2021  ? CL 105 12/21/2021  ? CREAT

## 2022-01-09 NOTE — Assessment & Plan Note (Signed)
Pt did not want to try medication ?She will work on diet and exercise ?

## 2022-01-09 NOTE — Assessment & Plan Note (Signed)
Well controlled, no changes to meds. Encouraged heart healthy diet such as the DASH diet and exercise as tolerated.  °

## 2022-01-09 NOTE — Assessment & Plan Note (Signed)
ghm utd ?Check labs se ?See avs ?

## 2022-01-10 LAB — COMPREHENSIVE METABOLIC PANEL
ALT: 12 U/L (ref 0–35)
AST: 14 U/L (ref 0–37)
Albumin: 3.8 g/dL (ref 3.5–5.2)
Alkaline Phosphatase: 68 U/L (ref 39–117)
BUN: 9 mg/dL (ref 6–23)
CO2: 27 mEq/L (ref 19–32)
Calcium: 8.4 mg/dL (ref 8.4–10.5)
Chloride: 107 mEq/L (ref 96–112)
Creatinine, Ser: 0.71 mg/dL (ref 0.40–1.20)
GFR: 100.25 mL/min (ref >60.00)
Glucose, Bld: 98 mg/dL (ref 70–99)
Potassium: 3.7 mEq/L (ref 3.5–5.1)
Sodium: 140 mEq/L (ref 135–145)
Total Bilirubin: 0.4 mg/dL (ref 0.2–1.2)
Total Protein: 6.6 g/dL (ref 6.0–8.3)

## 2022-01-10 LAB — LIPID PANEL
Cholesterol: 137 mg/dL (ref 0–200)
HDL: 45.9 mg/dL (ref >39.00)
LDL Cholesterol: 70 mg/dL (ref 0–99)
NonHDL: 90.89
Total CHOL/HDL Ratio: 3
Triglycerides: 102 mg/dL (ref 0.0–149.0)
VLDL: 20.4 mg/dL (ref 0.0–40.0)

## 2022-01-10 LAB — CBC WITH DIFFERENTIAL/PLATELET
Basophils Absolute: 0.1 10*3/uL (ref 0.0–0.1)
Basophils Relative: 1 % (ref 0.0–3.0)
Eosinophils Absolute: 0.3 10*3/uL (ref 0.0–0.7)
Eosinophils Relative: 3.6 % (ref 0.0–5.0)
HCT: 37.6 % (ref 36.0–46.0)
Hemoglobin: 11.8 g/dL — ABNORMAL LOW (ref 12.0–15.0)
Lymphocytes Relative: 30.3 % (ref 12.0–46.0)
Lymphs Abs: 2.9 10*3/uL (ref 0.7–4.0)
MCHC: 31.4 g/dL (ref 30.0–36.0)
MCV: 66.5 fl — ABNORMAL LOW (ref 78.0–100.0)
Monocytes Absolute: 0.7 10*3/uL (ref 0.1–1.0)
Monocytes Relative: 7.6 % (ref 3.0–12.0)
Neutro Abs: 5.4 10*3/uL (ref 1.4–7.7)
Neutrophils Relative %: 57.5 % (ref 43.0–77.0)
Platelets: 299 10*3/uL (ref 150.0–400.0)
RBC: 5.66 Mil/uL — ABNORMAL HIGH (ref 3.87–5.11)
RDW: 14.8 % (ref 11.5–15.5)
WBC: 9.4 10*3/uL (ref 4.0–10.5)

## 2022-01-10 LAB — HEPATITIS C ANTIBODY
Hepatitis C Ab: NONREACTIVE
SIGNAL TO CUT-OFF: 0.07 (ref <1.00)

## 2022-01-10 LAB — TSH: TSH: 1.22 u[IU]/mL (ref 0.35–5.50)

## 2022-01-10 LAB — VITAMIN D 25 HYDROXY (VIT D DEFICIENCY, FRACTURES): VITD: 16.43 ng/mL — ABNORMAL LOW (ref 30.00–100.00)

## 2022-01-15 ENCOUNTER — Other Ambulatory Visit: Payer: Self-pay

## 2022-01-15 MED ORDER — VITAMIN D (ERGOCALCIFEROL) 1.25 MG (50000 UNIT) PO CAPS: 50000.0000 [IU] | ORAL_CAPSULE | ORAL | 1 refills | Status: AC

## 2022-01-22 ENCOUNTER — Other Ambulatory Visit: Payer: Self-pay | Admitting: Family Medicine

## 2022-01-22 DIAGNOSIS — J302 Other seasonal allergic rhinitis: Secondary | ICD-10-CM

## 2022-01-23 NOTE — Telephone Encounter (Signed)
Pt called to check status of refill- rx sent.

## 2022-03-22 ENCOUNTER — Other Ambulatory Visit: Payer: Self-pay | Admitting: Family Medicine

## 2022-03-22 DIAGNOSIS — J302 Other seasonal allergic rhinitis: Secondary | ICD-10-CM

## 2022-04-11 ENCOUNTER — Encounter (INDEPENDENT_AMBULATORY_CARE_PROVIDER_SITE_OTHER): Payer: Self-pay

## 2022-05-15 ENCOUNTER — Ambulatory Visit: Payer: PRIVATE HEALTH INSURANCE | Admitting: Nurse Practitioner

## 2022-05-15 ENCOUNTER — Encounter: Payer: Self-pay | Admitting: Nurse Practitioner

## 2022-05-15 VITALS — BP 110/78 | HR 84

## 2022-05-15 DIAGNOSIS — Z789 Other specified health status: Secondary | ICD-10-CM

## 2022-05-15 NOTE — Progress Notes (Signed)
Office Visit   Subjective:  Patient ID: Laurie Monroe, female    DOB: [birth year]  Age: 51 y.o. MRN: [id number]  CC: wellness exam  HPI Sebastian presents for wellness exam visit for insurance benefit.  Patient has a PCP: Dr. Etter Sjogren  PMH significant for: CVD, GERD Last labs per PCP were completed: May 2023  Health Maintenance:  Colonoscopy: August 2021, repeat in 10 years Mammogram: June 2023 Pap: June 2023, hx of partial hysterectomy.     Smoker: Never  Immunizations:  Shingrix-  will obtain next year.  COVID- 3 vaccines total.  Tdap: 2016.  Lifestyle: Diet- no specific diet Exercise- none     Past Medical History:  Diagnosis Date   Allergy    Anemia    Chicken pox    Epilepsy ([place])    Hx of Epilepsy. No seizures in over 20 years   GERD (gastroesophageal reflux disease)    Leukocytosis 12/28/2015   Seizures ([place])    last episode 20 years ago   Thalassemia trait, alpha 12/28/2015   Tricuspid regurgitation     Past Surgical History:  Procedure Laterality Date   ABDOMINAL HYSTERECTOMY N/A 03/28/2016   Procedure: HYSTERECTOMY ABDOMINAL;  Surgeon: Cheri Fowler, MD;  Location: [facility];  Service: Gynecology;  Laterality: N/A;   BILATERAL SALPINGECTOMY Bilateral 03/28/2016   Procedure: BILATERAL SALPINGECTOMY;  Surgeon: Cheri Fowler, MD;  Location: [facility];  Service: Gynecology;  Laterality: Bilateral;   DILATION AND CURETTAGE OF UTERUS     Heavy Periods   VARICOSE VEIN SURGERY      Outpatient Medications Prior to Visit  Medication Sig Dispense Refill   albuterol (VENTOLIN HFA) 108 (90 Base) MCG/ACT inhaler INHALE 2 PUFFS EVERY 6 HOURS AS NEEDED FOR WHEEZING OR SHORTNESS OF BREATH 8.5 g 1   cetirizine (ZYRTEC) 10 MG tablet TAKE 1 TABLET(10 MG) BY MOUTH DAILY 90 tablet 1   furosemide (LASIX) 40 MG tablet TAKE 1 TABLET(40 MG) BY MOUTH DAILY 90 tablet 1   meloxicam (MOBIC) 15 MG tablet Take 1 tablet (15 mg total) by mouth daily. 30 tablet 3   mometasone (NASONEX)  50 MCG/ACT nasal spray Place 2 sprays into the nose at bedtime. As needed. 17 g 3   montelukast (SINGULAIR) 10 MG tablet Take 1 tablet (10 mg total) by mouth at bedtime. 90 tablet 1   MULTIPLE VITAMIN PO Take 1 tablet by mouth daily.      Vitamin D, Ergocalciferol, (DRISDOL) 1.25 MG (50000 UNIT) CAPS capsule Take 1 capsule (50,000 Units total) by mouth every 7 (seven) days. 12 capsule 1   No facility-administered medications prior to visit.    ROS Review of Systems  Constitutional:  Negative for fatigue.  Respiratory:  Positive for shortness of breath (sometimes).   Cardiovascular:  Positive for leg swelling (sometimes). Negative for chest pain.  Gastrointestinal:  Negative for constipation and diarrhea.  Musculoskeletal:  Negative for myalgias.  Neurological:  Negative for headaches.    Objective:  BP 110/78   Pulse 84   LMP 03/28/2016 (Exact Date)   SpO2 98%   Physical Exam Constitutional:      General: She is not in acute distress. HENT:     Head: Normocephalic.  Cardiovascular:     Rate and Rhythm: Normal rate and regular rhythm.     Pulses: Normal pulses.     Heart sounds: Normal heart sounds.  Pulmonary:     Effort: Pulmonary effort is normal.  Musculoskeletal:  General: Normal range of motion.     Right lower leg: No edema.     Left lower leg: No edema.  Skin:    General: Skin is warm.  Neurological:     General: No focal deficit present.     Mental Status: She is alert and oriented to person, place, and time.  Psychiatric:        Mood and Affect: Mood normal.        Behavior: Behavior normal.     Assessment & Plan:    Tiffany was seen today for wellness exam.  Diagnoses and all orders for this visit:  Participant in health and wellness plan Adult wellness physical was conducted today. Importance of diet and exercise were discussed in detail.  We reviewed immunizations and gave recommendations regarding current immunization needed for age.   Preventative health exams needed: up to date.   Patient was advised yearly wellness exam    No orders of the defined types were placed in this encounter.   No orders of the defined types were placed in this encounter.   Follow-up: As needed

## 2022-07-25 ENCOUNTER — Telehealth: Payer: Self-pay | Admitting: Family Medicine

## 2022-07-25 NOTE — Telephone Encounter (Signed)
Nurse Assessment Nurse: Martyn Ehrich RN, Felicia Date/Time (Eastern Time): 07/25/2022 3:27:54 PM Confirm and document reason for call. If symptomatic, describe symptoms. ---Pt is having tingling that comes and goes in both hands off and on for 2 weeks. She has numbness in her R pinky finger onset today. She wants appointment Monday. When she wakes in the am she has had tingling in both hands (but not numbness) for 2 weeks. No fever. Does the patient have any new or worsening symptoms? ---Yes Will a triage be completed? ---Yes Related visit to physician within the last 2 weeks? ---No Does the PT have any chronic conditions? (i.e. diabetes, asthma, this includes High risk factors for pregnancy, etc.) ---Yes List chronic conditions. ---overwt Is the patient pregnant or possibly pregnant? (Ask all females between the ages of 23-55) ---No Is this a behavioral health or substance abuse call? ---No Guidelines Guideline Title Affirmed Question Affirmed Notes Nurse Date/Time (Eastern Time) Neurologic Deficit [1] Numbness (i.e., loss of sensation) of the face, arm / hand, or leg / foot on one [place], RN, Felicia 95/05/3266 1:24:58 PM PLEASE NOTE: All timestamps contained within this report are represented as [place] Standard Time. CONFIDENTIALTY NOTICE: This fax transmission is intended only for the addressee. It contains information that is legally privileged, confidential or otherwise protected from use or disclosure. If you are not the intended recipient, you are strictly prohibited from reviewing, disclosing, copying using or disseminating any of this information or taking any action in reliance on or regarding this information. If you have received this fax in error, please notify us immediately by telephone so that we can arrange for its return to [place]. Phone: [phone], Toll-Free: [phone], Fax: [phone] Page: 2 of 2 Call Id: [id number] Guidelines Guideline Title Affirmed Question  Affirmed Notes Nurse Date/Time Eilene Ghazi Time) side of the body AND [2] sudden onset AND [3] present now Disp. Time Eilene Ghazi Time) Disposition Final User 07/25/2022 3:26:04 PM Send to Urgent Clarnce Flock 07/25/2022 3:36:12 PM Call EMS 911 Now Yes Gaddy, RN, [facility] 68/34/1960 2:29:79 PM 911 Outcome Documentation Martyn Ehrich, RN, [place] Reason: she declined to call EMS wants MD appt on her day off Monday 07/25/2022 3:45:44 PM Call Completed Martyn Ehrich RN, [place] Final Disposition 07/25/2022 3:36:12 PM Call EMS 911 Now Yes Gaddy, RN, [place] Caller Disagree/Comply Disagree Caller Understands Yes PreDisposition InappropriateToAsk Care Advice Given Per Guideline CALL EMS 911 NOW: * Immediate medical attention is needed. You need to hang up and call 911 (or an ambulance). * Triager Discretion: I'll call you back in a few minutes to be sure you were able to reach them. Comments User: Daphene Calamity, RN Date/Time Eilene Ghazi Time): 07/25/2022 3:36:59 PM she feels the numbness is new in R pinkey and the tingling was not new User: Daphene Calamity, RN Date/Time (Eastern Time): 07/25/2022 3:40:50 PM this is very tricky - she did have tingling on and off on both hands for 2 weeks but she says the numbness in the R pinkey is new, not sure if that makes sense. Will call the office and report situation so they can call her back per profile directives User: Daphene Calamity, RN Date/Time Eilene Ghazi Time): 07/25/2022 3:45:31 PM reported to [facility] and she will call pt and report to the MD that is available today. Told her the pt said multiple times the numbness in R pinky is new but it is complex bc she is having tingling on both hands off and on for 2 weeks

## 2022-07-25 NOTE — Telephone Encounter (Signed)
Spoke with triage patient declined calling 911 or going to ED Tingling in hands has been going on for 2 weeks , R pinky became numb today , pt is able to walk and doesn't have 1 sided weakness Pt is scheduled to see Dr.Wendling 07/30/22 per pt request to see a provider on Monday

## 2022-07-25 NOTE — Telephone Encounter (Signed)
Pt stated she has noticed some ongoing tingling and numbness in her right hand and sometimes her left. Transferred to triage for nurse eval.

## 2022-07-30 ENCOUNTER — Encounter: Payer: Self-pay | Admitting: Family Medicine

## 2022-07-30 ENCOUNTER — Ambulatory Visit: Payer: PRIVATE HEALTH INSURANCE | Admitting: Family Medicine

## 2022-07-30 VITALS — BP 118/82 | HR 82 | Temp 98.3°F | Ht 66.0 in | Wt 261.0 lb

## 2022-07-30 DIAGNOSIS — G5603 Carpal tunnel syndrome, bilateral upper limbs: Secondary | ICD-10-CM

## 2022-07-30 NOTE — Progress Notes (Addendum)
Musculoskeletal Exam  Patient: Laurie Monroe DOB: [birth year]  DOS: 07/30/2022  SUBJECTIVE:  Chief Complaint:   Chief Complaint  Patient presents with   both hands tingling. Numbness in right pinky   Foot Pain    Left foot in the mornings, gradually goes away.     Laurie Monroe is a 51 y.o.  female for evaluation and treatment of tingling in both hands.   Onset:  2 weeks ago. No inj or change in activity.  Location: hands Character:  tingling  Progression of issue:  unchanged Associated symptoms: Slight weakness; mainly affects at night Treatment: to date has been OTC NSAIDS and prescription NSAIDS.    Past Medical History:  Diagnosis Date   Allergy    Anemia    Chicken pox    Epilepsy ([place])    Hx of Epilepsy. 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   Tricuspid regurgitation     Objective: VITAL SIGNS: BP 118/82 (BP Location: Left Arm, Patient Position: Sitting, Cuff Size: Large)   Pulse 82   Temp 98.3 F (36.8 C) (Oral)   Ht '5\' 6"'$  (1.676 m)   Wt 261 lb (118.4 kg)   LMP 03/28/2016 (Exact Date)   SpO2 98%   BMI 42.13 kg/m  Constitutional: Well formed, well developed. No acute distress. Thorax & Lungs: No accessory muscle use Musculoskeletal: hands.   Normal active range of motion: yes.   Normal passive range of motion: yes Tenderness to palpation: no Deformity: no Ecchymosis: no Tests positive: none Tests negative: Tinel's, Phalen's b/l Neurologic: Normal sensory function. Sensation intact to pinprick.  No focal deficits noted. Psychiatric: Normal mood. Age appropriate judgment and insight. Alert & oriented x 3.    Assessment:  Bilateral carpal tunnel syndrome  Plan: Wrist braces to wear at night. I think this is mild given lack of + PE testing and relatively little daytime s/s's. F/u prn.  The patient voiced  understanding and agreement to the plan.   [place], DO 07/30/22  1:10 PM

## 2022-07-30 NOTE — Patient Instructions (Signed)
Wear the braces at night.  Stretch the wrists.  Let me know if things are worsening.

## 2022-08-03 ENCOUNTER — Other Ambulatory Visit: Payer: Self-pay | Admitting: Family Medicine

## 2022-08-03 DIAGNOSIS — J302 Other seasonal allergic rhinitis: Secondary | ICD-10-CM

## 2022-09-04 ENCOUNTER — Telehealth (INDEPENDENT_AMBULATORY_CARE_PROVIDER_SITE_OTHER): Payer: PRIVATE HEALTH INSURANCE | Admitting: Medical

## 2022-09-04 ENCOUNTER — Encounter: Payer: Self-pay | Admitting: Medical

## 2022-09-04 ENCOUNTER — Telehealth: Payer: PRIVATE HEALTH INSURANCE | Admitting: Family Medicine

## 2022-09-04 ENCOUNTER — Other Ambulatory Visit: Payer: Self-pay | Admitting: Medical

## 2022-09-04 DIAGNOSIS — U071 COVID-19: Secondary | ICD-10-CM

## 2022-09-04 DIAGNOSIS — J4521 Mild intermittent asthma with (acute) exacerbation: Secondary | ICD-10-CM

## 2022-09-04 DIAGNOSIS — R059 Cough, unspecified: Secondary | ICD-10-CM | POA: Diagnosis not present

## 2022-09-04 DIAGNOSIS — J302 Other seasonal allergic rhinitis: Secondary | ICD-10-CM | POA: Diagnosis not present

## 2022-09-04 MED ORDER — BUDESONIDE-FORMOTEROL FUMARATE 160-4.5 MCG/ACT IN AERO: 2.0000 | INHALATION_SPRAY | Freq: Two times a day (BID) | RESPIRATORY_TRACT | 12 refills | Status: DC

## 2022-09-04 MED ORDER — HYDROCODONE BIT-HOMATROP MBR 5-1.5 MG/5ML PO SOLN: 5.0000 mL | Freq: Four times a day (QID) | ORAL | 0 refills | Status: DC | PRN

## 2022-09-04 MED ORDER — ALBUTEROL SULFATE HFA 108 (90 BASE) MCG/ACT IN AERS: INHALATION_SPRAY | RESPIRATORY_TRACT | 1 refills | Status: DC

## 2022-09-04 MED ORDER — MONTELUKAST SODIUM 10 MG PO TABS: ORAL_TABLET | ORAL | 1 refills | Status: DC

## 2022-09-04 MED ORDER — FLUTICASONE PROPIONATE 50 MCG/ACT NA SUSP: 2.0000 | Freq: Every day | NASAL | 0 refills | Status: DC

## 2022-09-04 MED ORDER — NIRMATRELVIR/RITONAVIR (PAXLOVID)TABLET: 3.0000 | ORAL_TABLET | Freq: Two times a day (BID) | ORAL | 0 refills | Status: AC

## 2022-09-04 MED ORDER — CETIRIZINE HCL 10 MG PO TABS: ORAL_TABLET | ORAL | 1 refills | Status: DC

## 2022-09-04 NOTE — Progress Notes (Signed)
   Subjective:    Patient ID: Laurie Monroe, female    DOB: [birth year], 52 y.o.   MRN: [id number]  HPI Virtual Visit via Video Note  I connected with Laurie Monroe on 09/04/22 at  9:20 AM EST by a video enabled telemedicine application and verified that I am speaking with the correct person using two identifiers.  Location: Patient: home Provider: office   I discussed the limitations of evaluation and management by telemedicine and the availability of in person appointments. The patient expressed understanding and agreed to proceed.  History of Present Illness:   Pt got symptoms new year eve. She states got scratch throat and worked yesterday after coworker tested +. Last night low grade temperature, fatigue, cough, bodyaches and wheezing.   Pt has asthma and needs refill of inhaler. Also need refill of her allergy meds. Pt states usually uses inhaler very infrequently.  Pt states in past when got covid got antiviral and symptoms got better quickly. She hopes to get antiviral. Pt had prior covid vaccines 4 in total.  Pt has old prescrirption of promethazine with codeine but did not stop her cough.   Observations/Objective: General-no acute distress, pleasant, oriented. Lungs- on inspection lungs appear unlabored. Neck- no tracheal deviation or jvd on inspection. Neuro- gross motor function appears intact.    Assessment and Plan:  Patient Instructions  Covid infection in patient with asthma.Recent flare with moderate symptoms on day 2 of symptoms. She request antiviral.  Rx flonase nasal spray, hycodan for cough, symbicort inhaler and refilling your albuterol  Rx paxolvid antiviral. Rx advisement given. Explained EAU authorization.  Keep checking your 02 sat %. Discussed what are good numbers. If decreasing and worsening then be seen in the ED.  Counseled on possible secondary infections.  Follow up 7 days or sooner if needed.        Mackie Pai, PA-C  Follow  Up Instructions:    I discussed the assessment and treatment plan with the patient. The patient was provided an opportunity to ask questions and all were answered. The patient agreed with the plan and demonstrated an understanding of the instructions.   The patient was advised to call back or seek an in-person evaluation if the symptoms worsen or if the condition fails to improve as anticipated.     Mackie Pai, PA-C    Review of Systems     Objective:   Physical Exam        Assessment & Plan:

## 2022-09-04 NOTE — Patient Instructions (Addendum)
Covid infection in patient with asthma.Recent flare with moderate symptoms on day 2 of symptoms. She request antiviral.  Rx flonase nasal spray, hycodan for cough, symbicort inhaler and refilling your albuterol  Rx paxolvid antiviral. Rx advisement given. Explained EAU authorization.  Keep checking your 02 sat %. Discussed what are good numbers. If decreasing and worsening then be seen in the ED.  Counseled on possible secondary infections.  Follow up 7 days or sooner if needed.

## 2022-09-05 ENCOUNTER — Encounter: Payer: Self-pay | Admitting: Family

## 2022-09-19 ENCOUNTER — Telehealth: Payer: Self-pay | Admitting: Family Medicine

## 2022-09-19 NOTE — Telephone Encounter (Signed)
Pt seen virtually on 09/04/22 with [place]. Please advise

## 2022-09-19 NOTE — Telephone Encounter (Signed)
Pt called stating that the cough syrup that she had been prescribed isn't working to treat the lingering cough off COVID. Pt would like to know if there is anything else that can be sent in to help with this.

## 2022-09-20 ENCOUNTER — Other Ambulatory Visit: Payer: Self-pay | Admitting: Family Medicine

## 2022-09-20 DIAGNOSIS — R053 Chronic cough: Secondary | ICD-10-CM

## 2022-09-20 MED ORDER — PREDNISONE 10 MG PO TABS: ORAL_TABLET | ORAL | 0 refills | Status: DC

## 2022-09-20 NOTE — Telephone Encounter (Signed)
Spoke w/ Pt-inform pred taper sent- to come into office if not improving. Pt verbalized understanding.

## 2022-10-01 ENCOUNTER — Other Ambulatory Visit: Payer: Self-pay | Admitting: Family Medicine

## 2022-10-01 DIAGNOSIS — J302 Other seasonal allergic rhinitis: Secondary | ICD-10-CM

## 2022-10-02 ENCOUNTER — Ambulatory Visit (HOSPITAL_BASED_OUTPATIENT_CLINIC_OR_DEPARTMENT_OTHER)
Admission: RE | Admit: 2022-10-02 | Discharge: 2022-10-02 | Disposition: A | Discharge status: Home / Self Care | Payer: PRIVATE HEALTH INSURANCE | Source: Ambulatory Visit | Attending: Family Medicine | Admitting: Family Medicine

## 2022-10-02 ENCOUNTER — Ambulatory Visit: Payer: PRIVATE HEALTH INSURANCE | Admitting: Family Medicine

## 2022-10-02 ENCOUNTER — Encounter: Payer: Self-pay | Admitting: Family Medicine

## 2022-10-02 VITALS — BP 120/80 | HR 95 | Temp 99.0°F | Resp 18 | Ht 66.0 in | Wt 261.8 lb

## 2022-10-02 DIAGNOSIS — R051 Acute cough: Secondary | ICD-10-CM | POA: Insufficient documentation

## 2022-10-02 MED ORDER — AZITHROMYCIN 250 MG PO TABS: ORAL_TABLET | ORAL | 0 refills | Status: AC

## 2022-10-02 NOTE — Patient Instructions (Signed)

## 2022-10-02 NOTE — Progress Notes (Signed)
Subjective:   By signing my name below, I, Laurie Monroe, attest that this documentation has been prepared under the direction and in the presence of Laurie Held, DO. 10/02/2022   Patient ID: Laurie Monroe, female    DOB: 1970-10-28, 52 y.o.   MRN: [id number]  Chief Complaint  Patient presents with   Cough   Follow-up    Cough Pertinent negatives include no chest pain, fever, headaches, rash or shortness of breath. There is no history of environmental allergies.   Patient is in today for a office visit.   She continues coughing. She denies congestion but is having phlegm with her cough. Her fever has resolved since last visit. She has finished her prednisone course and found it did not help her cough. She continues using zyrtec daily but does not use Flonase.  She also complains of fatigue since recovering from Covid-19.  She is inquiring if she can take omega XL which is a fish oil supplements to help manage her joint pain.    Past Medical History:  Diagnosis Date   Allergy    Anemia    Chicken pox    Epilepsy ([place])    Hx of Epilepsy. No seizures in over 20 years   GERD (gastroesophageal reflux disease)    Leukocytosis 12/28/2015   Seizures ([place])    last episode 20 years ago   Thalassemia trait, alpha 12/28/2015   Tricuspid regurgitation     Past Surgical History:  Procedure Laterality Date   ABDOMINAL HYSTERECTOMY N/A 03/28/2016   Procedure: HYSTERECTOMY ABDOMINAL;  Surgeon: Cheri Fowler, MD;  Location: [facility];  Service: Gynecology;  Laterality: N/A;   BILATERAL SALPINGECTOMY Bilateral 03/28/2016   Procedure: BILATERAL SALPINGECTOMY;  Surgeon: Cheri Fowler, MD;  Location: [facility];  Service: Gynecology;  Laterality: Bilateral;   DILATION AND CURETTAGE OF UTERUS     Heavy Periods   VARICOSE VEIN SURGERY      Family History  Problem Relation Age of Onset   Hypertension Mother    Prostate cancer Father    Hypertension Father    Liver cancer Father     Stomach cancer Father    Heart disease Father    Hypertension Brother        Twin Brother   Heart disease Brother 47       coronary stent    Social History   Socioeconomic History   Marital status: Single    Spouse name: Not on file   Number of children: Not on file   Years of education: Not on file   Highest education level: Not on file  Occupational History   Occupation: CNA    Employer: [employer]  Tobacco Use   Smoking status: Never   Smokeless tobacco: Never  Substance and Sexual Activity   Alcohol use: No    Alcohol/week: 0.0 standard drinks of alcohol   Drug use: No   Sexual activity: Yes    Birth control/protection: None  Other Topics Concern   Not on file  Social History Narrative   Exercise ---- no except walking at work    Lives with her daughter    Social Determinants of Health   Financial Resource Strain: Not on file  Food Insecurity: Not on file  Transportation Needs: Not on file  Physical Activity: Not on file  Stress: Not on file  Social Connections: Not on file  Intimate Partner Violence: Not on file    Outpatient Medications Prior to Visit  Medication Sig Dispense Refill   albuterol (VENTOLIN HFA) 108 (90 Base) MCG/ACT inhaler INHALE 2 PUFFS EVERY 6 HOURS AS NEEDED FOR WHEEZING OR SHORTNESS OF BREATH 8.5 g 1   budesonide-formoterol (SYMBICORT) 160-4.5 MCG/ACT inhaler Inhale 2 puffs into the lungs 2 (two) times daily. 1 each 12   cetirizine (ZYRTEC) 10 MG tablet TAKE 1 TABLET(10 MG) BY MOUTH DAILY 90 tablet 1   furosemide (LASIX) 40 MG tablet TAKE 1 TABLET(40 MG) BY MOUTH DAILY 90 tablet 1   mometasone (NASONEX) 50 MCG/ACT nasal spray Place 2 sprays into the nose at bedtime. As needed. 17 g 3   montelukast (SINGULAIR) 10 MG tablet TAKE 1 TABLET(10 MG) BY MOUTH AT BEDTIME 90 tablet 1   MULTIPLE VITAMIN PO Take 1 tablet by mouth daily.      Vitamin D, Ergocalciferol, (DRISDOL) 1.25 MG (50000 UNIT) CAPS capsule Take 1 capsule (50,000 Units total)  by mouth every 7 (seven) days. 12 capsule 1   fluticasone (FLONASE) 50 MCG/ACT nasal spray SHAKE LIQUID AND USE 2 SPRAYS IN EACH NOSTRIL DAILY 48 g 2   HYDROcodone bit-homatropine (HYCODAN) 5-1.5 MG/5ML syrup Take 5 mLs by mouth every 6 (six) hours as needed for cough. 120 mL 0   meloxicam (MOBIC) 15 MG tablet Take 1 tablet (15 mg total) by mouth daily. 30 tablet 3   predniSONE (DELTASONE) 10 MG tablet TAKE 3 TABLETS PO QD FOR 3 DAYS THEN TAKE 2 TABLETS PO QD FOR 3 DAYS THEN TAKE 1 TABLET PO QD FOR 3 DAYS THEN TAKE 1/2 TAB PO QD FOR 3 DAYS (Patient not taking: Reported on 10/02/2022) 20 tablet 0   No facility-administered medications prior to visit.    No Known Allergies  Review of Systems  Constitutional:  Positive for malaise/fatigue. Negative for fever.  HENT:  Negative for congestion.   Eyes:  Negative for blurred vision.  Respiratory:  Positive for cough and sputum production. Negative for shortness of breath.   Cardiovascular:  Negative for chest pain, palpitations and leg swelling.  Gastrointestinal:  Negative for abdominal pain, blood in stool and nausea.  Genitourinary:  Negative for dysuria and frequency.  Musculoskeletal:  Negative for falls.  Skin:  Negative for rash.  Neurological:  Negative for dizziness, loss of consciousness and headaches.  Endo/Heme/Allergies:  Negative for environmental allergies.  Psychiatric/Behavioral:  Negative for depression. The patient is not nervous/anxious.        Objective:    Physical Exam Vitals and nursing note reviewed.  Constitutional:      General: She is not in acute distress.    Appearance: Normal appearance. She is not ill-appearing.  HENT:     Head: Normocephalic and atraumatic.     Right Ear: External ear normal.     Left Ear: External ear normal.  Eyes:     Extraocular Movements: Extraocular movements intact.     Pupils: Pupils are equal, round, and reactive to light.  Cardiovascular:     Rate and Rhythm: Normal rate  and regular rhythm.     Heart sounds: Normal heart sounds. No murmur heard.    No gallop.  Pulmonary:     Effort: Pulmonary effort is normal. No respiratory distress.     Breath sounds: Normal breath sounds. No wheezing or rales.  Skin:    General: Skin is warm and dry.  Neurological:     Mental Status: She is alert and oriented to person, place, and time.  Psychiatric:  Judgment: Judgment normal.     BP 120/80 (BP Location: Right Arm, Patient Position: Sitting, Cuff Size: Large)   Pulse 95   Temp 99 F (37.2 C) (Oral)   Resp 18   Ht '5\' 6"'$  (1.676 m)   Wt 261 lb 12.8 oz (118.8 kg)   LMP 03/28/2016 (Exact Date)   SpO2 95%   BMI 42.26 kg/m  Wt Readings from Last 3 Encounters:  10/02/22 261 lb 12.8 oz (118.8 kg)  07/30/22 261 lb (118.4 kg)  01/09/22 263 lb 3.2 oz (119.4 kg)       Assessment & Plan:  Acute cough -     Azithromycin; Take 2 tablets on day 1, then 1 tablet daily on days 2 through 5  Dispense: 6 tablet; Refill: 0 -     DG Chest 2 View; Future    I, Laurie Held, DO, personally preformed the services described in this documentation.  All medical record entries made by the scribe were at my direction and in my presence.  I have reviewed the chart and discharge instructions (if applicable) and agree that the record reflects my personal performance and is accurate and complete. 10/02/2022   I,Laurie Monroe,acting as a scribe for Laurie Held, DO.,have documented all relevant documentation on the behalf of Laurie Held, DO,as directed by  Laurie Held, DO while in the presence of Laurie Held, DO.   Laurie Held, DO

## 2023-04-09 ENCOUNTER — Ambulatory Visit: Payer: PRIVATE HEALTH INSURANCE | Admitting: Nurse Practitioner

## 2023-04-10 ENCOUNTER — Other Ambulatory Visit: Payer: Self-pay | Admitting: Medical

## 2023-04-10 ENCOUNTER — Other Ambulatory Visit: Payer: Self-pay | Admitting: Family Medicine

## 2023-04-10 DIAGNOSIS — J302 Other seasonal allergic rhinitis: Secondary | ICD-10-CM

## 2023-04-23 ENCOUNTER — Ambulatory Visit: Payer: PRIVATE HEALTH INSURANCE | Admitting: Nurse Practitioner

## 2023-04-23 ENCOUNTER — Encounter: Payer: Self-pay | Admitting: Nurse Practitioner

## 2023-04-23 VITALS — BP 138/88 | HR 93 | Ht 66.0 in | Wt 250.0 lb

## 2023-04-23 DIAGNOSIS — Z789 Other specified health status: Secondary | ICD-10-CM

## 2023-04-23 MED ORDER — ALBUTEROL SULFATE HFA 108 (90 BASE) MCG/ACT IN AERS
INHALATION_SPRAY | RESPIRATORY_TRACT | 1 refills | Status: AC
Start: 2023-04-23 — End: ?

## 2023-04-23 NOTE — Progress Notes (Signed)
[facility]  Subjective:  Patient ID: Laurie Monroe, female    DOB: [birth year]  Age: 52 y.o. MRN: [id number]  CC: wellness exam   HPI Laurie Monroe presents for wellness exam visit for insurance benefit.  Patient has a PCP: Dr. Zola Button PMH significant for:  PMHx of anemia and seizures. Hx of allergies, uses inhaler as needed. Some leg swelling/ uses lasix as needed. Last labs per PCP were completed: May 2023   Health Maintenance:  Colonoscopy: August 2021, repeat in 10 years.  Mammogram: July 2024 Pap: up to date     Smoker:Never  Immunizations:  Shingrix-  interested in this.  COVID- x3 Flu: yearly Tdap: due 2026   Lifestyle: Diet- no particular diet.  Exercise- active at work but no dedicated exercise routine    Would also like some refills of lasix and albuterol.    Past Medical History:  Diagnosis Date   Allergy    Anemia    Chicken pox    Epilepsy ([facility])    Hx of Epilepsy. 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   Tricuspid regurgitation     Past Surgical History:  Procedure Laterality Date   ABDOMINAL HYSTERECTOMY N/A 03/28/2016   Procedure: HYSTERECTOMY ABDOMINAL;  Surgeon: Lavina Hamman, MD;  Location: [facility];  Service: Gynecology;  Laterality: N/A;   BILATERAL SALPINGECTOMY Bilateral 03/28/2016   Procedure: BILATERAL SALPINGECTOMY;  Surgeon: Lavina Hamman, MD;  Location: [facility];  Service: Gynecology;  Laterality: Bilateral;   DILATION AND CURETTAGE OF UTERUS     Heavy Periods   VARICOSE VEIN SURGERY      Outpatient Medications Prior to Visit  Medication Sig Dispense Refill   albuterol (VENTOLIN HFA) 108 (90 Base) MCG/ACT inhaler INHALE 2 PUFFS EVERY 6 HOURS AS NEEDED FOR WHEEZING OR SHORTNESS OF BREATH 8.5 g 1   cetirizine (ZYRTEC) 10 MG tablet TAKE 1 TABLET(10 MG) BY MOUTH DAILY 90  tablet 1   furosemide (LASIX) 40 MG tablet TAKE 1 TABLET(40 MG) BY MOUTH DAILY 90 tablet 1   mometasone (NASONEX) 50 MCG/ACT nasal spray Place 2 sprays into the nose at bedtime. As needed. 17 g 3   montelukast (SINGULAIR) 10 MG tablet TAKE 1 TABLET(10 MG) BY MOUTH AT BEDTIME 90 tablet 1   MULTIPLE VITAMIN PO Take 1 tablet by mouth daily.      Vitamin D, Ergocalciferol, (DRISDOL) 1.25 MG (50000 UNIT) CAPS capsule Take 1 capsule (50,000 Units total) by mouth every 7 (seven) days. 12 capsule 1   budesonide-formoterol (SYMBICORT) 160-4.5 MCG/ACT inhaler Inhale 2 puffs into the lungs 2 (two) times daily. 1 each 12   No facility-administered medications prior to visit.    ROS Review of Systems  Constitutional:  Negative for fatigue and unexpected weight change.  HENT:  Negative for hearing loss.   Eyes:  Negative for visual disturbance.  Respiratory:  Negative for shortness of breath.   Cardiovascular:  Positive for leg swelling. Negative for chest pain.  Gastrointestinal:  Negative for constipation, diarrhea, nausea and vomiting.  Musculoskeletal:  Negative for back pain and joint swelling.  Neurological:  Negative for dizziness, seizures and headaches.    Objective:  BP 138/88   Pulse 93   Ht 5\' 6"  (1.676 m)   Wt 250 lb (113.4 kg)   LMP 03/28/2016 (Exact Date)  SpO2 98%   BMI 40.35 kg/m   Physical Exam Constitutional:      General: She is not in acute distress. HENT:     Head: Normocephalic.  Eyes:     Pupils: Pupils are equal, round, and reactive to light.  Cardiovascular:     Rate and Rhythm: Normal rate and regular rhythm.     Heart sounds: Normal heart sounds.  Pulmonary:     Effort: Pulmonary effort is normal.  Abdominal:     General: Abdomen is flat. Bowel sounds are normal.     Palpations: Abdomen is soft.  Musculoskeletal:        General: Normal range of motion.     Right lower leg: Edema present.     Left lower leg: Edema (non pitting) present.  Neurological:      General: No focal deficit present.     Mental Status: She is alert.      Assessment & Plan:    Laurie Monroe was seen today for wellness exam.  Diagnoses and all orders for this visit:  Participant in health and wellness plan  Adult wellness physical was conducted today. Importance of diet and exercise were discussed in detail.  We reviewed immunizations and gave recommendations regarding current immunization needed for age. Would like Shingrix, she will call at a later time to schedule this with me.  Preventative health exams are up to date.  Patient was advised yearly wellness exam ad follow-up with PCP for management/ refill of lasix. Also due to annual labs.    No orders of the defined types were placed in this encounter.   No orders of the defined types were placed in this encounter.   Follow-up: as needed.

## 2023-05-14 ENCOUNTER — Encounter: Payer: Self-pay | Admitting: Family Medicine

## 2023-05-14 ENCOUNTER — Ambulatory Visit (INDEPENDENT_AMBULATORY_CARE_PROVIDER_SITE_OTHER): Payer: PRIVATE HEALTH INSURANCE | Admitting: Family Medicine

## 2023-05-14 VITALS — BP 120/88 | HR 94 | Temp 98.7°F | Resp 18 | Ht 66.0 in | Wt 269.4 lb

## 2023-05-14 DIAGNOSIS — M79604 Pain in right leg: Secondary | ICD-10-CM | POA: Diagnosis not present

## 2023-05-14 DIAGNOSIS — J4521 Mild intermittent asthma with (acute) exacerbation: Secondary | ICD-10-CM

## 2023-05-14 DIAGNOSIS — D509 Iron deficiency anemia, unspecified: Secondary | ICD-10-CM

## 2023-05-14 DIAGNOSIS — M79605 Pain in left leg: Secondary | ICD-10-CM | POA: Diagnosis not present

## 2023-05-14 DIAGNOSIS — I10 Essential (primary) hypertension: Secondary | ICD-10-CM

## 2023-05-14 DIAGNOSIS — E559 Vitamin D deficiency, unspecified: Secondary | ICD-10-CM | POA: Diagnosis not present

## 2023-05-14 DIAGNOSIS — Z0000 Encounter for general adult medical examination without abnormal findings: Secondary | ICD-10-CM

## 2023-05-14 DIAGNOSIS — R600 Localized edema: Secondary | ICD-10-CM

## 2023-05-14 MED ORDER — FUROSEMIDE 40 MG PO TABS: ORAL_TABLET | ORAL | 1 refills | Status: AC

## 2023-05-14 NOTE — Progress Notes (Unsigned)
Established Patient Office Visit  Subjective   Patient ID: Laurie Monroe, female    DOB: 05/07/1971  Age: 52 y.o. MRN: [id number]  Chief Complaint  Patient presents with   Annual Exam    Pt states fasting     HPI Discussed the use of AI scribe software for clinical note transcription with the patient, who gave verbal consent to proceed.  History of Present Illness   The patient presents for a physical and blood work, expressing concern about recent weight gain, suspected fluid retention, and increased consumption of sweets. She denies a personal history of diabetes but notes an aunt with the condition. She reports swelling and pain in her legs, particularly when not wearing compression stockings. The pain is described as being on the sides and front of the legs, extending down to the feet, and is present upon standing and walking. The patient also mentions fatigue, suspecting that her iron levels may be low due to a history of requiring iron infusions for anemia related to thalassemia.  The patient's asthma is managed with Singulair, Zyrtec, and as-needed albuterol. She reports no recent use of Lasix, despite previous prescriptions. She also mentions plantar fasciitis, managed with specific footwear recommended by a podiatrist.  The patient works long hours in a skilled nursing facility, often skipping lunch due to the workload. She expresses concern about a potential shift to 12-hour workdays. She also mentions a recent loss of her mother to a brain tumor.      Patient Active Problem List   Diagnosis Date Noted   Vitamin D deficiency 05/15/2023   Pain in both lower extremities 05/15/2023   Mild intermittent asthma with acute exacerbation 05/15/2023   Preventative health care 01/09/2022   Myalgia 12/21/2021   Post-viral cough syndrome 04/25/2021   Cough 10/30/2020   Bilateral edema of lower extremity 10/30/2020   Morbid obesity ([facility]) 10/30/2020   Reactive airway disease 08/28/2018    Viral upper respiratory tract infection 06/26/2017   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   Pulmonary hypertension ([facility]) 03/24/2017   Iron deficiency anemia 07/13/2016   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   Tricuspid regurgitation    Past Surgical History:  Procedure Laterality Date   ABDOMINAL HYSTERECTOMY N/A 03/28/2016   Procedure: HYSTERECTOMY ABDOMINAL;  Surgeon: Lavina Hamman, MD;  Location: [facility];  Service: Gynecology;  Laterality: N/A;   BILATERAL SALPINGECTOMY Bilateral 03/28/2016   Procedure: BILATERAL SALPINGECTOMY;  Surgeon: Lavina Hamman, MD;  Location: [facility];  Service: Gynecology;  Laterality: Bilateral;   DILATION AND CURETTAGE OF UTERUS     Heavy Periods   VARICOSE VEIN SURGERY     Social History   Tobacco Use   Smoking status: Never  Smokeless tobacco: Never  Substance Use Topics   Alcohol use: No    Alcohol/week: 0.0 standard drinks of alcohol   Drug use: No   Social History   Socioeconomic History   Marital status: Single    Spouse name: Not on file   Number of children: Not on file   Years of education: Not on file   Highest education level: Not on file  Occupational History   Occupation: CNA    Employer: [employer]   Tobacco Use   Smoking status: Never   Smokeless tobacco: Never  Substance and Sexual Activity   Alcohol use: No    Alcohol/week: 0.0 standard drinks of alcohol   Drug use: No   Sexual activity: Not Currently    Partners: Male    Birth control/protection: None  Other Topics Concern   Not on file  Social History Narrative   Exercise ---- no except walking at work    Lives with her daughter    Social Determinants of Health   Financial Resource Strain: Not on file  Food Insecurity: Not on file  Transportation Needs: Not on file  Physical Activity: Not on file  Stress: Not on file  Social Connections: Not on file  Intimate Partner Violence: Not on file   Family Status  Relation Name Status   Mother  Deceased at age 5   Father  Alive   Sister  Alive   Brother  Alive   Brother  Alive   Daughter  Alive  No partnership data on file   Family History  Problem Relation Age of Onset   Hypertension Mother    Brain cancer Mother    Prostate cancer Father    Hypertension Father    Liver cancer Father    Stomach cancer Father    Heart disease Father    Hypertension Brother        Twin Brother   Heart disease Brother 22       coronary stent   No Known Allergies    Review of Systems  Constitutional:  Negative for chills, fever and malaise/fatigue.  HENT:  Negative for congestion and hearing loss.   Eyes:  Negative for blurred vision and discharge.  Respiratory:  Negative for cough, sputum production and shortness of breath.   Cardiovascular:  Negative for chest pain, palpitations and leg swelling.  Gastrointestinal:  Negative for abdominal pain, blood in stool, constipation, diarrhea, heartburn, nausea and vomiting.  Genitourinary:  Negative for dysuria, frequency, hematuria and urgency.  Musculoskeletal:  Negative for back pain, falls and myalgias.  Skin:  Negative for rash.  Neurological:  Negative for dizziness, sensory change, loss of consciousness, weakness and  headaches.  Endo/Heme/Allergies:  Negative for environmental allergies. Does not bruise/bleed easily.  Psychiatric/Behavioral:  Negative for depression and suicidal ideas. The patient is not nervous/anxious and does not have insomnia.       Objective:     BP 120/88 (BP Location: Left Arm, Patient Position: Sitting, Cuff Size: Large)   Pulse 94   Temp 98.7 F (37.1 C) (Oral)   Resp 18   Ht 5\' 6"  (1.676 m)   Wt 269 lb 6.4 oz (122.2 kg)   LMP 03/28/2016 (Exact Date)   SpO2 99%   BMI 43.48 kg/m  BP Readings from Last 3 Encounters:  05/14/23 120/88  04/23/23 138/88  10/02/22 120/80   Wt Readings from Last 3 Encounters:  05/14/23 269 lb 6.4 oz (122.2 kg)  04/23/23 250 lb (113.4  kg)  10/02/22 261 lb 12.8 oz (118.8 kg)   SpO2 Readings from Last 3 Encounters:  05/14/23 99%  04/23/23 98%  10/02/22 95%      Physical Exam Vitals and nursing note reviewed.  Constitutional:      General: She is not in acute distress.    Appearance: Normal appearance. She is well-developed.  HENT:     Head: Normocephalic and atraumatic.  Eyes:     General: No scleral icterus.       Right eye: No discharge.        Left eye: No discharge.  Cardiovascular:     Rate and Rhythm: Normal rate and regular rhythm.     Heart sounds: No murmur heard. Pulmonary:     Effort: Pulmonary effort is normal. No respiratory distress.     Breath sounds: Normal breath sounds.  Musculoskeletal:        General: Normal range of motion.     Cervical back: Normal range of motion and neck supple.     Right lower leg: No edema.     Left lower leg: No edema.  Skin:    General: Skin is warm and dry.  Neurological:     Mental Status: She is alert and oriented to person, place, and time.  Psychiatric:        Mood and Affect: Mood normal.        Behavior: Behavior normal.        Thought Content: Thought content normal.        Judgment: Judgment normal.      Results for orders placed or performed in visit on  05/14/23  Iron, TIBC and Ferritin Panel  Result Value Ref Range   Iron 65 45 - 160 mcg/dL   TIBC 161 096 - 045 mcg/dL (calc)   %SAT 22 16 - 45 % (calc)   Ferritin 231 16 - 232 ng/mL    Last CBC Lab Results  Component Value Date   WBC 9.4 01/09/2022   HGB 11.8 (L) 01/09/2022   HCT 37.6 01/09/2022   MCV 66.5 (L) 01/09/2022   MCH 21.8 (L) 12/28/2016   RDW 14.8 01/09/2022   PLT 299.0 01/09/2022   Last metabolic panel Lab Results  Component Value Date   GLUCOSE 98 01/09/2022   NA 140 01/09/2022   K 3.7 01/09/2022   CL 107 01/09/2022   CO2 27 01/09/2022   BUN 9 01/09/2022   CREATININE 0.71 01/09/2022   GFR 100.25 01/09/2022   CALCIUM 8.4 01/09/2022   PROT 6.6 01/09/2022   ALBUMIN 3.8 01/09/2022   LABGLOB 3.1 10/26/2016   AGRATIO 1.3 10/26/2016   BILITOT 0.4 01/09/2022   ALKPHOS 68 01/09/2022   AST 14 01/09/2022   ALT 12 01/09/2022   ANIONGAP 7 03/16/2016   Last lipids Lab Results  Component Value Date   CHOL 137 01/09/2022   HDL 45.90 01/09/2022   LDLCALC 70 01/09/2022   TRIG 102.0 01/09/2022   CHOLHDL 3 01/09/2022   Last hemoglobin A1c Lab Results  Component Value Date   HGBA1C 5.2 10/07/2014   Last thyroid functions Lab Results  Component Value Date   TSH 1.22 01/09/2022   Last vitamin D Lab Results  Component Value Date   VD25OH 16.43 (L) 01/09/2022   Last vitamin B12 and Folate No results found for: "VITAMINB12", "FOLATE"    The 10-year ASCVD risk score (Arnett DK, et al., 2019) is: 2.4%    Assessment & Plan:   Problem List  Items Addressed This Visit       Unprioritized   Iron deficiency anemia   Relevant Orders   Iron, TIBC and Ferritin Panel (Completed)   HTN (hypertension)   Relevant Medications   furosemide (LASIX) 40 MG tablet   Vitamin D deficiency   Relevant Orders   VITAMIN D 25 Hydroxy (Vit-D Deficiency, Fractures)   Preventative health care - Primary    Ghm utd Check labs   See [facility] Health Maintenance  Topic Date Due    HIV Screening  Never done   Zoster Vaccines- Shingrix (1 of 2) Never done   COVID-19 Vaccine (4 - 2023-24 season) 05/05/2023   INFLUENZA VACCINE  12/02/2023 (Originally 04/04/2023)   MAMMOGRAM  03/12/2024   DTaP/Tdap/Td (2 - Td or Tdap) 07/02/2025   Colonoscopy  04/14/2030   Hepatitis C Screening  Completed   HPV VACCINES  Aged Out   PAP SMEAR-Modifier  Discontinued         Relevant Orders   Lipid panel   CBC with Differential/Platelet   Comprehensive metabolic panel   Hemoglobin A1c   TSH   Pain in both lower extremities    Pain with walking  Check art doppler  Check labs      Relevant Orders   Magnesium   VAS [place] ABI WITH/WO TBI   Morbid obesity ([facility])    Discussed diet and exercise       Mild intermittent asthma with acute exacerbation    Stable        Bilateral edema of lower extremity    Restart lasix Compression stockings Elevated feet when sitting       Relevant Medications   furosemide (LASIX) 40 MG tablet  Assessment and Plan    Lower Extremity Edema Patient reports swelling and pain in lower extremities, particularly when not wearing compression stockings. No current diuretic therapy. -Order Lasix for fluid retention. -Order lower extremity ultrasound to assess for circulation issues.  Asthma Controlled with Singulair, Zyrtec, and Albuterol as needed. -Continue current regimen.  Iron Deficiency Anemia History of iron infusions, but patient reports no improvement in symptoms. Possible Thalassemia. -Order complete blood count and iron studies to assess current status.  Risk of Diabetes Patient reports increased consumption of sweets and weight gain. No personal history of diabetes, but family history present. -Order A1c to assess for prediabetes or diabetes.  Hyperlipidemia Patient requests cholesterol check. -Order lipid panel as part of routine physical.  General Health Maintenance -Advise patient to receive flu shot and shingles vaccine  on a day off work. -Check Vitamin D levels as patient reports taking Vitamin D3 2000 units daily. -Schedule follow-up appointment to discuss lab results and ultrasound findings.     \  No follow-ups on file.    Donato Schultz, DO

## 2023-05-15 DIAGNOSIS — E559 Vitamin D deficiency, unspecified: Secondary | ICD-10-CM | POA: Insufficient documentation

## 2023-05-15 DIAGNOSIS — M79604 Pain in right leg: Secondary | ICD-10-CM | POA: Insufficient documentation

## 2023-05-15 DIAGNOSIS — J4521 Mild intermittent asthma with (acute) exacerbation: Secondary | ICD-10-CM | POA: Insufficient documentation

## 2023-05-15 LAB — IRON,TIBC AND FERRITIN PANEL
%SAT: 22 % (ref 16–45)
Ferritin: 231 ng/mL (ref 16–232)
Iron: 65 ug/dL (ref 45–160)
TIBC: 293 ug/dL (ref 250–450)

## 2023-05-15 LAB — LIPID PANEL
Cholesterol: 141 mg/dL (ref 0–200)
HDL: 57.6 mg/dL (ref >39.00)
LDL Cholesterol: 71 mg/dL (ref 0–99)
NonHDL: 83.36
Total CHOL/HDL Ratio: 2
Triglycerides: 60 mg/dL (ref 0.0–149.0)
VLDL: 12 mg/dL (ref 0.0–40.0)

## 2023-05-15 LAB — CBC WITH DIFFERENTIAL/PLATELET
Basophils Absolute: 0.1 10*3/uL (ref 0.0–0.1)
Basophils Relative: 0.8 % (ref 0.0–3.0)
Eosinophils Absolute: 0.2 10*3/uL (ref 0.0–0.7)
Eosinophils Relative: 1.8 % (ref 0.0–5.0)
HCT: 38.8 % (ref 36.0–46.0)
Hemoglobin: 12 g/dL (ref 12.0–15.0)
Lymphocytes Relative: 26.6 % (ref 12.0–46.0)
Lymphs Abs: 2.8 10*3/uL (ref 0.7–4.0)
MCHC: 30.9 g/dL (ref 30.0–36.0)
MCV: 66.4 fl — ABNORMAL LOW (ref 78.0–100.0)
Monocytes Absolute: 0.7 10*3/uL (ref 0.1–1.0)
Monocytes Relative: 6.8 % (ref 3.0–12.0)
Neutro Abs: 6.8 10*3/uL (ref 1.4–7.7)
Neutrophils Relative %: 64 % (ref 43.0–77.0)
Platelets: 325 10*3/uL (ref 150.0–400.0)
RBC: 5.84 Mil/uL — ABNORMAL HIGH (ref 3.87–5.11)
RDW: 15.3 % (ref 11.5–15.5)
WBC: 10.5 10*3/uL (ref 4.0–10.5)

## 2023-05-15 LAB — COMPREHENSIVE METABOLIC PANEL
ALT: 15 U/L (ref 0–35)
AST: 17 U/L (ref 0–37)
Albumin: 4 g/dL (ref 3.5–5.2)
Alkaline Phosphatase: 76 U/L (ref 39–117)
BUN: 12 mg/dL (ref 6–23)
CO2: 28 meq/L (ref 19–32)
Calcium: 9.4 mg/dL (ref 8.4–10.5)
Chloride: 104 meq/L (ref 96–112)
Creatinine, Ser: 0.78 mg/dL (ref 0.40–1.20)
GFR: 88.72 mL/min (ref >60.00)
Glucose, Bld: 68 mg/dL — ABNORMAL LOW (ref 70–99)
Potassium: 4 meq/L (ref 3.5–5.1)
Sodium: 141 meq/L (ref 135–145)
Total Bilirubin: 0.6 mg/dL (ref 0.2–1.2)
Total Protein: 6.8 g/dL (ref 6.0–8.3)

## 2023-05-15 LAB — MAGNESIUM: Magnesium: 1.7 mg/dL (ref 1.5–2.5)

## 2023-05-15 LAB — TSH: TSH: 1.08 u[IU]/mL (ref 0.35–5.50)

## 2023-05-15 LAB — HEMOGLOBIN A1C: Hgb A1c MFr Bld: 5.9 % (ref 4.6–6.5)

## 2023-05-15 LAB — VITAMIN D 25 HYDROXY (VIT D DEFICIENCY, FRACTURES): VITD: 38.96 ng/mL (ref 30.00–100.00)

## 2023-05-15 NOTE — Assessment & Plan Note (Signed)
Stable

## 2023-05-15 NOTE — Assessment & Plan Note (Signed)
Pain with walking  Check art doppler  Check labs

## 2023-05-15 NOTE — Assessment & Plan Note (Signed)
Discussed diet and exercise 

## 2023-05-15 NOTE — Assessment & Plan Note (Signed)
Restart lasix Compression stockings Elevated feet when sitting

## 2023-05-15 NOTE — Assessment & Plan Note (Signed)
Ghm utd Check labs   See [facility] Health Maintenance  Topic Date Due   HIV Screening  Never done   Zoster Vaccines- Shingrix (1 of 2) Never done   COVID-19 Vaccine (4 - 2023-24 season) 05/05/2023   INFLUENZA VACCINE  12/02/2023 (Originally 04/04/2023)   MAMMOGRAM  03/12/2024   DTaP/Tdap/Td (2 - Td or Tdap) 07/02/2025   Colonoscopy  04/14/2030   Hepatitis C Screening  Completed   HPV VACCINES  Aged Out   PAP SMEAR-Modifier  Discontinued

## 2023-05-17 ENCOUNTER — Ambulatory Visit (HOSPITAL_COMMUNITY): Payer: PRIVATE HEALTH INSURANCE

## 2023-05-28 ENCOUNTER — Ambulatory Visit (HOSPITAL_COMMUNITY)
Admission: RE | Admit: 2023-05-28 | Payer: PRIVATE HEALTH INSURANCE | Source: Ambulatory Visit | Attending: Family Medicine | Admitting: Family Medicine

## 2023-06-06 ENCOUNTER — Ambulatory Visit (HOSPITAL_COMMUNITY)
Admission: RE | Admit: 2023-06-06 | Discharge: 2023-06-06 | Disposition: A | Discharge status: Home / Self Care | Payer: PRIVATE HEALTH INSURANCE | Source: Ambulatory Visit | Attending: Internal Medicine | Admitting: Internal Medicine

## 2023-06-06 DIAGNOSIS — M79604 Pain in right leg: Secondary | ICD-10-CM

## 2023-06-06 DIAGNOSIS — M79605 Pain in left leg: Secondary | ICD-10-CM

## 2023-06-07 LAB — VAS US LOWER EXT ART SEG MULTI (SEGMENTALS & LE RAYNAUDS)
Left ABI: 1.01
Right ABI: 0.97

## 2023-06-10 ENCOUNTER — Telehealth: Payer: Self-pay | Admitting: Family Medicine

## 2023-06-10 NOTE — Telephone Encounter (Signed)
Pt called and would like to discuss lab results with nurse. Please advise.

## 2023-06-11 NOTE — Telephone Encounter (Signed)
Pt wanted to go over her ultrasound results not labs. States her legs are still swollen and hurting. She would like to know if pcp can call anything in to help. Please advise.

## 2023-06-12 ENCOUNTER — Other Ambulatory Visit: Payer: Self-pay

## 2023-06-12 DIAGNOSIS — M79605 Pain in left leg: Secondary | ICD-10-CM

## 2023-06-12 NOTE — Telephone Encounter (Signed)
Spoke with patient. Pt states having pain all the time and only takes lasix on the weekends or when she is off. Per patient maybe twice a week. Stat D Dimer ordered. Pt coming in for lab tomorrow.

## 2023-06-12 NOTE — Telephone Encounter (Signed)
Pt seen on 9/10 for swelling in the legs. Please advise

## 2023-06-13 ENCOUNTER — Other Ambulatory Visit (INDEPENDENT_AMBULATORY_CARE_PROVIDER_SITE_OTHER): Payer: PRIVATE HEALTH INSURANCE

## 2023-06-13 DIAGNOSIS — M79605 Pain in left leg: Secondary | ICD-10-CM | POA: Diagnosis not present

## 2023-06-13 DIAGNOSIS — M79604 Pain in right leg: Secondary | ICD-10-CM

## 2023-06-13 LAB — D-DIMER, QUANTITATIVE: D-Dimer, Quant: 0.37 ug{FEU}/mL (ref <0.50)

## 2023-06-14 NOTE — Telephone Encounter (Signed)
Spoke with patient. Pt advised to take lasix daily for a week and if no changes to swelling or pain to schedule a OV.

## 2023-07-17 ENCOUNTER — Ambulatory Visit (HOSPITAL_COMMUNITY)
Admission: EM | Admit: 2023-07-17 | Discharge: 2023-07-17 | Disposition: A | Discharge status: Home / Self Care | Payer: PRIVATE HEALTH INSURANCE | Attending: Emergency Medicine | Admitting: Emergency Medicine

## 2023-07-17 ENCOUNTER — Ambulatory Visit (INDEPENDENT_AMBULATORY_CARE_PROVIDER_SITE_OTHER): Payer: PRIVATE HEALTH INSURANCE

## 2023-07-17 ENCOUNTER — Encounter (HOSPITAL_COMMUNITY): Payer: Self-pay

## 2023-07-17 ENCOUNTER — Encounter: Payer: Self-pay | Admitting: Family

## 2023-07-17 DIAGNOSIS — J069 Acute upper respiratory infection, unspecified: Secondary | ICD-10-CM

## 2023-07-17 LAB — POC COVID19/FLU A&B COMBO
Covid Antigen, POC: NEGATIVE
Influenza A Antigen, POC: NEGATIVE
Influenza B Antigen, POC: NEGATIVE

## 2023-07-17 MED ORDER — ACETAMINOPHEN 325 MG PO TABS
975.0000 mg | ORAL_TABLET | Freq: Once | ORAL | Status: AC
  Administered 2023-07-17: 975 mg via ORAL

## 2023-07-17 MED ORDER — ACETAMINOPHEN 325 MG PO TABS
ORAL_TABLET | ORAL | Status: AC
  Filled 2023-07-17: qty 3

## 2023-07-17 NOTE — ED Triage Notes (Addendum)
Pt reports SOB,diarrhea and nausea that started yesterday.Pt states her right foot is bothering her however she is waiting to have an MRI.

## 2023-07-17 NOTE — Discharge Instructions (Addendum)
Your COVID and flu testing were negative.  Your chest x-ray did not show any obvious pneumonia, the radiologist interprets this differently we will contact you in follow-up.   Take 500 mg of Tylenol every 6-8 hours as needed for fever.  If you have any breakthrough fever you can take 600 mg of ibuprofen.  Please ensure you are staying well-hydrated with at least 64 ounces of water, you can add an electrolyte solution like Gatorade, Pedialyte or ginger ale to help settle your stomach.  You can do soups and broths and other bland things like toast, rice, applesauce, and bananas.   Symptoms should improve over the next 5 to 7 days.  If no improvement or any changes please return to clinic.

## 2023-07-17 NOTE — ED Provider Notes (Signed)
[facility]    CSN: [id number] Arrival date & time: 07/17/23  1602      History   Chief Complaint Chief Complaint  Patient presents with   Shortness of Breath   Cough   Nausea   Diarrhea    HPI Laurie Monroe is a 52 y.o. female.   Patient presents to clinic reporting generalized bodyaches, weakness, nonproductive cough, shortness of breath, wheezing, diarrhea, nausea and vomiting that started yesterday.  She is febrile in clinic today.  She took Zicam this morning.  She was recently exposed to someone who was sick, reports they tested negative for COVID and flu.  She is having continued left lower leg pain.  She is awaiting an MRI for posterior tibial tendon dysfunction.  She is in a cam boot.  No recent injuries.  She denies sore throat.  She does have some mild congestion.  No emesis today.  No nausea currently.  No diarrhea today.   The history is provided by the patient and medical records.  Shortness of Breath Cough Diarrhea   Past Medical History:  Diagnosis Date   Allergy    Anemia    Chicken pox    Epilepsy (HCC)    Hx of Epilepsy. 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   Tricuspid regurgitation     Patient Active Problem List   Diagnosis Date Noted   Vitamin D deficiency 05/15/2023   Pain in both lower extremities 05/15/2023   Mild intermittent asthma with acute exacerbation 05/15/2023   Preventative health care 01/09/2022   Myalgia 12/21/2021   Post-viral cough syndrome 04/25/2021   Cough 10/30/2020   Bilateral edema of lower extremity 10/30/2020   Morbid obesity (HCC) 10/30/2020   Reactive airway disease 08/28/2018   Viral upper respiratory tract infection 06/26/2017   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   Pulmonary hypertension (HCC) 03/24/2017   Iron deficiency anemia  07/13/2016   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   S/P abdominal hysterectomy 03/28/2016   Leukocytosis 12/28/2015   Thalassemia trait, alpha 12/28/2015   HTN (hypertension) 11/07/2015   Elevated BP 10/10/2015   GERD (gastroesophageal reflux disease) 10/10/2015   Chest pain 10/10/2015   Edema 10/10/2015   Lower extremity edema 10/10/2015   Chronic venous insufficiency 04/18/2015   Onychomycosis 09/01/2013   Pain in toe 09/01/2013   Seizures (HCC) 07/08/2012    Past Surgical History:  Procedure Laterality Date   ABDOMINAL HYSTERECTOMY N/A 03/28/2016   Procedure: HYSTERECTOMY ABDOMINAL;  Surgeon: Lavina Hamman, MD;  Location: [facility];  Service: Gynecology;  Laterality: N/A;   BILATERAL SALPINGECTOMY Bilateral 03/28/2016   Procedure: BILATERAL SALPINGECTOMY;  Surgeon: Lavina Hamman, MD;  Location: [facility];  Service: Gynecology;  Laterality: Bilateral;   DILATION AND CURETTAGE OF UTERUS     Heavy Periods   VARICOSE VEIN SURGERY      OB History   No obstetric history on file.      Home Medications    Prior to Admission medications   Medication Sig Start Date End Date Taking? Authorizing Provider  albuterol (VENTOLIN HFA) 108 (90 Base) MCG/ACT inhaler INHALE 2 PUFFS EVERY 6 HOURS AS NEEDED FOR WHEEZING OR SHORTNESS OF BREATH 04/23/23  Yes Gloris Ham, NP  cetirizine (ZYRTEC) 10 MG tablet TAKE 1 TABLET(10 MG) BY MOUTH DAILY 04/10/23  Yes Lowne  Florina Ou R, DO  mometasone (NASONEX) 50 MCG/ACT nasal spray Place 2 sprays into the nose at bedtime. As needed. 10/27/20  Yes Seabron Spates R, DO  montelukast (SINGULAIR) 10 MG tablet TAKE 1 TABLET(10 MG) BY MOUTH AT BEDTIME 04/10/23  Yes Saguier, Ramon Dredge, PA-C  MULTIPLE VITAMIN PO Take 1 tablet by mouth daily.    Yes [provider]  Vitamin D, Ergocalciferol, (DRISDOL) 1.25 MG (50000 UNIT) CAPS capsule Take 1 capsule (50,000 Units total) by mouth every 7 (seven) days. 01/15/22  Yes Seabron Spates R, DO  furosemide  (LASIX) 40 MG tablet TAKE 1 TABLET(40 MG) BY MOUTH DAILY 05/14/23   Donato Schultz, DO    Family History Family History  Problem Relation Age of Onset   Hypertension Mother    Brain cancer Mother    Prostate cancer Father    Hypertension Father    Liver cancer Father    Stomach cancer Father    Heart disease Father    Hypertension Brother        Twin Brother   Heart disease Brother 52       coronary stent    Social History Social History   Tobacco Use   Smoking status: Never   Smokeless tobacco: Never  Substance Use Topics   Alcohol use: No    Alcohol/week: 0.0 standard drinks of alcohol   Drug use: No     Allergies   Patient has no known allergies.   Review of Systems Review of Systems  Per HPI   Physical Exam Triage Vital Signs ED Triage Vitals  Encounter Vitals Group     BP 07/17/23 1727 (!) 167/67     Systolic BP Percentile --      Diastolic BP Percentile --      Pulse Rate 07/17/23 1727 (!) 125     Resp 07/17/23 1727 18     Temp 07/17/23 1727 (!) 101.3 F (38.5 C)     Temp Source 07/17/23 1727 Oral     SpO2 07/17/23 1732 100 %     Weight --      Height --      Head Circumference --      Peak Flow --      Pain Score 07/17/23 1731 5     Pain Loc --      Pain Education --      Exclude from Growth Chart --    No data found.  Updated Vital Signs BP (!) 167/67 (BP Location: Left Arm)   Pulse (!) 125   Temp (!) 101.3 F (38.5 C) (Oral)   Resp 18   LMP 03/28/2016 (Exact Date)   SpO2 100%   Visual Acuity Right Eye Distance:   Left Eye Distance:   Bilateral Distance:    Right Eye Near:   Left Eye Near:    Bilateral Near:     Physical Exam Vitals and nursing note reviewed.  Constitutional:      Appearance: Normal appearance. She is well-developed.  HENT:     Head: Normocephalic and atraumatic.     Right Ear: External ear normal.     Left Ear: External ear normal.     Nose: Congestion present. No rhinorrhea.     Mouth/Throat:      Mouth: Mucous membranes are moist.     Pharynx: Posterior oropharyngeal erythema present.  Eyes:     Conjunctiva/sclera: Conjunctivae normal.  Cardiovascular:     Rate and Rhythm: Regular  rhythm. Tachycardia present.     Heart sounds: Normal heart sounds. No murmur heard. Pulmonary:     Effort: Pulmonary effort is normal.     Breath sounds: Normal breath sounds. No decreased breath sounds or wheezing.  Musculoskeletal:        General: Normal range of motion.  Skin:    General: Skin is warm and dry.  Neurological:     General: No focal deficit present.     Mental Status: She is alert.  Psychiatric:        Mood and Affect: Mood normal.      UC Treatments / Results  Labs (all labs ordered are listed, but only abnormal results are displayed) Labs Reviewed  POC COVID19/FLU A&B COMBO    EKG   Radiology No results found.  Procedures Procedures (including critical care time)  Medications Ordered in UC Medications  acetaminophen (TYLENOL) tablet 975 mg (975 mg Oral Given 07/17/23 1802)    Initial Impression / Assessment and Plan / UC Course  I have reviewed the triage vital signs and the nursing notes.  Pertinent labs & imaging results that were available during my care of the patient were reviewed by me and considered in my medical decision making (see chart for details).  Vitals and triage reviewed, patient is hemodynamically stable.  Lungs are vesicular, heart with regular rate and rhythm, she is tachycardic.  Temperature improved with Tylenol administration. COVID and flu testing negative.  X-ray does not show any acute abnormalities, awaiting official radiology overread.  Symptomatic management for viral URI discussed, we will contact you if radiology results require follow-up.  Plan of care, follow-up care and return precautions given, no questions at this time.  Work note provided.     Final Clinical Impressions(s) / UC Diagnoses   Final diagnoses:  Viral  URI with cough     Discharge Instructions      Your COVID and flu testing were negative.  Your chest x-ray did not show any obvious pneumonia, the radiologist interprets this differently we will contact you in follow-up.   Take 500 mg of Tylenol every 6-8 hours as needed for fever.  If you have any breakthrough fever you can take 600 mg of ibuprofen.  Please ensure you are staying well-hydrated with at least 64 ounces of water, you can add an electrolyte solution like Gatorade, Pedialyte or ginger ale to help settle your stomach.  You can do soups and broths and other bland things like toast, rice, applesauce, and bananas.   Symptoms should improve over the next 5 to 7 days.  If no improvement or any changes please return to clinic.      ED Prescriptions   None    PDMP not reviewed this encounter.   Antonia Jicha, [place], [place] 07/17/23 [phone]

## 2023-07-23 ENCOUNTER — Telehealth: Payer: Self-pay | Admitting: Family Medicine

## 2023-07-23 NOTE — Telephone Encounter (Signed)
Pt seen on 11/13 at [facility] for sxs. Note in Epic. Please advise

## 2023-07-23 NOTE — Telephone Encounter (Signed)
Pt called to request if PCP could send in cough medicine for her sxs. She stated she went to UC the other day but sxs has not improved. Please call and advise.

## 2023-07-24 NOTE — Telephone Encounter (Signed)
If she wasn't seen by one of [place], I make patients follow up with our office before considering prescribing. Ty.

## 2023-07-24 NOTE — Telephone Encounter (Signed)
Pt called back to follow up on request for cough medicine. Pt was advised that her message was routed to her provider but she has not been in the office. Pt wants to know if someone else can review and send something in. Please advise pt

## 2023-07-25 ENCOUNTER — Encounter: Payer: Self-pay | Admitting: Family

## 2023-07-25 ENCOUNTER — Ambulatory Visit: Payer: PRIVATE HEALTH INSURANCE | Admitting: Physician Assistant

## 2023-07-25 ENCOUNTER — Encounter: Payer: Self-pay | Admitting: Physician Assistant

## 2023-07-25 VITALS — BP 130/82 | HR 96 | Temp 98.7°F | Ht 66.0 in | Wt 266.5 lb

## 2023-07-25 DIAGNOSIS — J45909 Unspecified asthma, uncomplicated: Secondary | ICD-10-CM | POA: Diagnosis not present

## 2023-07-25 MED ORDER — DOXYCYCLINE HYCLATE 100 MG PO TABS: 100.0000 mg | ORAL_TABLET | Freq: Two times a day (BID) | ORAL | 0 refills | Status: AC

## 2023-07-25 MED ORDER — PREDNISONE 20 MG PO TABS: 20.0000 mg | ORAL_TABLET | Freq: Every day | ORAL | 0 refills | Status: DC

## 2023-07-25 MED ORDER — PROMETHAZINE-DM 6.25-15 MG/5ML PO SYRP: 5.0000 mL | ORAL_SOLUTION | Freq: Four times a day (QID) | ORAL | 0 refills | Status: DC | PRN

## 2023-07-25 MED ORDER — MOMETASONE FUROATE 50 MCG/ACT NA SUSP: 2.0000 | Freq: Every day | NASAL | 3 refills | Status: AC

## 2023-07-25 NOTE — Progress Notes (Signed)
Established patient visit   Patient: Laurie Monroe   DOB: 11/30/1970   52 y.o. Female  MRN: [id number] Visit Date: 07/25/2023  Today's healthcare provider: Alfredia Ferguson, PA-C   Cc. Cough, fatigue  Subjective     Pt was seen at urgent care 07/17/23 for a URI, chest xray negative, covid/flu testing negative. Today, continued fatigue, cough ,congestion. Using her albuterol inhaler more often, but denies SOB, wheezing.    Medications: Outpatient Medications Prior to Visit  Medication Sig   albuterol (VENTOLIN HFA) 108 (90 Base) MCG/ACT inhaler INHALE 2 PUFFS EVERY 6 HOURS AS NEEDED FOR WHEEZING OR SHORTNESS OF BREATH   cetirizine (ZYRTEC) 10 MG tablet TAKE 1 TABLET(10 MG) BY MOUTH DAILY   furosemide (LASIX) 40 MG tablet TAKE 1 TABLET(40 MG) BY MOUTH DAILY   montelukast (SINGULAIR) 10 MG tablet TAKE 1 TABLET(10 MG) BY MOUTH AT BEDTIME   MULTIPLE VITAMIN PO Take 1 tablet by mouth daily.    Vitamin D, Ergocalciferol, (DRISDOL) 1.25 MG (50000 UNIT) CAPS capsule Take 1 capsule (50,000 Units total) by mouth every 7 (seven) days.   [DISCONTINUED] mometasone (NASONEX) 50 MCG/ACT nasal spray Place 2 sprays into the nose at bedtime. As needed.   No facility-administered medications prior to visit.    Review of Systems  Constitutional:  Positive for fatigue. Negative for fever.  HENT:  Positive for congestion, postnasal drip, rhinorrhea, sinus pressure and sore throat.   Respiratory:  Positive for cough. Negative for shortness of breath.   Cardiovascular:  Negative for chest pain and leg swelling.  Gastrointestinal:  Negative for abdominal pain.  Neurological:  Positive for headaches. Negative for dizziness.       Objective    BP 130/82   Pulse 96   Temp 98.7 F (37.1 C) (Oral)   Ht 5\' 6"  (1.676 m)   Wt 266 lb 8 oz (120.9 kg)   LMP 03/28/2016 (Exact Date)   SpO2 97%   BMI 43.01 kg/m    Physical Exam Constitutional:      General: She is awake.     Appearance: She is  well-developed.  HENT:     Head: Normocephalic.     Right Ear: Tympanic membrane normal.     Left Ear: Tympanic membrane normal.     Mouth/Throat:     Pharynx: Posterior oropharyngeal erythema present. No oropharyngeal exudate.  Eyes:     Conjunctiva/sclera: Conjunctivae normal.  Cardiovascular:     Rate and Rhythm: Normal rate and regular rhythm.     Heart sounds: Normal heart sounds.  Pulmonary:     Effort: Pulmonary effort is normal.     Breath sounds: Normal breath sounds.  Skin:    General: Skin is warm.  Neurological:     Mental Status: She is alert and oriented to person, place, and time.  Psychiatric:        Attention and Perception: Attention normal.        Mood and Affect: Mood normal.        Speech: Speech normal.        Behavior: Behavior is cooperative.     No results found for any visits on 07/25/23.  Assessment & Plan    Acute asthmatic bronchitis -     Doxycycline Hyclate; Take 1 tablet (100 mg total) by mouth 2 (two) times daily for 7 days.  Dispense: 14 tablet; Refill: 0 -     predniSONE; Take 1 tablet (20 mg total) by mouth daily  with breakfast.  Dispense: 5 tablet; Refill: 0 -     Promethazine-DM; Take 5 mLs by mouth 4 (four) times daily as needed for cough.  Dispense: 118 mL; Refill: 0 -     Mometasone Furoate; Place 2 sprays into the nose at bedtime. As needed.  Dispense: 17 g; Refill: 3  Advised rest, hydration, tylenol/ibuprofen, otc mucinex Rx doxycycline bid x 7 days, prednisone 20 mg x 5 days -- encouraged pt to use albuterol inhaler q 4-6 hr prn  Rx promethazine-dm cough syrup and refilled nasal spray  Return if symptoms worsen or fail to improve.       Alfredia Ferguson, PA-C  [facility] [phone] (phone) [phone] (fax)  [facility]

## 2023-07-26 NOTE — Telephone Encounter (Signed)
Pt seen by Lillia Abed yesterday and received medication.

## 2023-08-05 NOTE — Telephone Encounter (Signed)
Patient is scheduled for a f/u with [facility] tomorrow 12/3 @ 4

## 2023-08-05 NOTE — Telephone Encounter (Signed)
Pt called back and stated she is still having a bad cough and would like more medication sent in. Please call and advise. She specifically requested for a nurse to call.

## 2023-08-06 ENCOUNTER — Ambulatory Visit: Payer: PRIVATE HEALTH INSURANCE | Admitting: Family Medicine

## 2023-08-06 ENCOUNTER — Encounter: Payer: Self-pay | Admitting: Family Medicine

## 2023-08-06 VITALS — BP 120/88 | HR 101 | Temp 99.2°F | Resp 18 | Ht 66.0 in | Wt 260.0 lb

## 2023-08-06 DIAGNOSIS — J40 Bronchitis, not specified as acute or chronic: Secondary | ICD-10-CM | POA: Diagnosis not present

## 2023-08-06 MED ORDER — PREDNISONE 10 MG PO TABS: ORAL_TABLET | ORAL | 0 refills | Status: DC

## 2023-08-06 MED ORDER — AMOXICILLIN-POT CLAVULANATE 875-125 MG PO TABS: 1.0000 | ORAL_TABLET | Freq: Two times a day (BID) | ORAL | 0 refills | Status: DC

## 2023-08-06 MED ORDER — METHYLPREDNISOLONE ACETATE 80 MG/ML IJ SUSP
80.0000 mg | Freq: Once | INTRAMUSCULAR | Status: AC
  Administered 2023-08-06: 80 mg via INTRAMUSCULAR

## 2023-08-08 NOTE — Progress Notes (Signed)
Established Patient Office Visit  Subjective   Patient ID: Laurie Monroe, female    DOB: 08-30-1971  Age: 52 y.o. MRN: [id number]  Chief Complaint  Patient presents with   Cough    Pt states no improvement; still coughing    Follow-up    HPI Discussed the use of AI scribe software for clinical note transcription with the patient, who gave verbal consent to proceed.  History of Present Illness   The patient, with a history of posterior tibial tendon dysfunction, presented with a persistent cough that has not improved despite previous treatments. She initially sought care at an emergency room where she was diagnosed with a viral infection and was not prescribed any medications. Subsequently, she was seen by a nurse practitioner who prescribed doxycycline, prednisone, and cough syrup. Despite these interventions, the patient reported no improvement in her symptoms.  The patient described her cough as constant throughout the day, with clear sputum production. She denied chest tightness but reported headaches due to the intensity of the cough. The patient also noted a runny nose, which she was managing with Nasonex and daily Zyrtec. She reported no postnasal drainage or nasal congestion. The patient's cough was not alleviated by over-the-counter Robitussin or the prescribed cough syrup, though the latter did aid in sleep. The patient also reported using an inhaler, which provided some relief.  The patient had a low-grade fever and had lost her voice due to the severity of the cough. She denied any wheezing. The patient had previously been prescribed Tessalon Perles, but reported that they were ineffective. She had also been given a five-day course of prednisone and a seven-day course of doxycycline, neither of which improved her symptoms.      Patient Active Problem List   Diagnosis Date Noted   Vitamin D deficiency 05/15/2023   Pain in both lower extremities 05/15/2023   Mild intermittent  asthma with acute exacerbation 05/15/2023   Preventative health care 01/09/2022   Myalgia 12/21/2021   Post-viral cough syndrome 04/25/2021   Cough 10/30/2020   Bilateral edema of lower extremity 10/30/2020   Morbid obesity (HCC) 10/30/2020   Reactive airway disease 08/28/2018   Viral upper respiratory tract infection 06/26/2017   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   Pulmonary hypertension (HCC) 03/24/2017   Iron deficiency anemia 07/13/2016   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   Tricuspid regurgitation    Past Surgical History:  Procedure Laterality Date   ABDOMINAL HYSTERECTOMY N/A 03/28/2016   Procedure: HYSTERECTOMY ABDOMINAL;  Surgeon: Lavina Hamman, MD;  Location: [facility];  Service: Gynecology;  Laterality: N/A;   BILATERAL SALPINGECTOMY Bilateral 03/28/2016   Procedure: BILATERAL SALPINGECTOMY;  Surgeon: Lavina Hamman, MD;  Location: [facility]  ORS;  Service: Gynecology;  Laterality: Bilateral;   DILATION AND CURETTAGE OF UTERUS     Heavy Periods   VARICOSE VEIN SURGERY     Social History   Tobacco Use   Smoking status: Never   Smokeless tobacco: Never  Substance Use Topics   Alcohol use: No    Alcohol/week: 0.0 standard drinks of alcohol   Drug  use: No   Social History   Socioeconomic History   Marital status: Single    Spouse name: Not on file   Number of children: Not on file   Years of education: Not on file   Highest education level: Not on file  Occupational History   Occupation: CNA    Employer: [employer]  Tobacco Use   Smoking status: Never   Smokeless tobacco: Never  Substance and Sexual Activity   Alcohol use: No    Alcohol/week: 0.0 standard drinks of alcohol   Drug use: No   Sexual activity: Not Currently    Partners: Male    Birth control/protection: None  Other Topics Concern   Not on file  Social History Narrative   Exercise ---- no except walking at work    Lives with her daughter    Social Determinants of Health   Financial Resource Strain: Not on file  Food Insecurity: Not on file  Transportation Needs: Not on file  Physical Activity: Not on file  Stress: Not on file  Social Connections: Not on file  Intimate Partner Violence: Not on file   Family Status  Relation Name Status   Mother  Deceased at age 72   Father  Alive   Sister  Alive   Brother  Alive   Brother  Alive   Daughter  Alive  No partnership data on file   Family History  Problem Relation Age of Onset   Hypertension Mother    Brain cancer Mother    Prostate cancer Father    Hypertension Father    Liver cancer Father    Stomach cancer Father    Heart disease Father    Hypertension Brother        Twin Brother   Heart disease Brother 70       coronary stent   No Known Allergies    Review of Systems  Constitutional:  Negative for fever and malaise/fatigue.  HENT:  Negative for congestion.   Eyes:  Negative for blurred vision.  Respiratory:  Positive for cough. Negative for shortness of breath.   Cardiovascular:  Negative for chest pain, palpitations and leg swelling.  Gastrointestinal:  Negative for abdominal pain, blood in stool, nausea and vomiting.  Genitourinary:  Negative for dysuria and frequency.   Musculoskeletal:  Negative for back pain and falls.  Skin:  Negative for rash.  Neurological:  Negative for dizziness, loss of consciousness and headaches.  Endo/Heme/Allergies:  Negative for environmental allergies.  Psychiatric/Behavioral:  Negative for depression. The patient is not nervous/anxious.       Objective:     BP 120/88 (BP Location: Left Arm, Patient Position: Sitting, Cuff Size: Large)   Pulse (!) 101   Temp 99.2 F (37.3 C) (Oral)   Resp 18   Ht 5\' 6"  (1.676 m)   Wt 260 lb (117.9 kg)   LMP 03/28/2016 (Exact Date)   SpO2 94%   BMI 41.97 kg/m  BP Readings from Last 3 Encounters:  08/06/23 120/88  07/25/23 130/82  07/17/23 (!) 167/67   Wt Readings from Last 3  Encounters:  08/06/23 260 lb (117.9 kg)  07/25/23 266 lb 8 oz (120.9 kg)  05/14/23 269 lb 6.4 oz (122.2 kg)   SpO2 Readings from Last 3 Encounters:  08/06/23 94%  07/25/23 97%  07/17/23 100%      Physical Exam Vitals and nursing note reviewed.  Constitutional:      General: She is not in acute distress.    Appearance: Normal appearance. She is well-developed.  HENT:     Head: Normocephalic and atraumatic.  Eyes:     General: No scleral icterus.       Right eye: No discharge.        Left eye: No discharge.  Cardiovascular:     Rate and Rhythm: Normal rate and regular rhythm.     Heart sounds: No murmur heard. Pulmonary:     Effort: Pulmonary effort is normal. No respiratory distress.     Breath sounds: Wheezing present.  Musculoskeletal:        General: Normal range of motion.     Cervical back: Normal range of motion and neck supple.     Right lower leg: No edema.     Left lower leg: No edema.  Skin:    General: Skin is warm and dry.  Neurological:     Mental Status: She is alert and oriented to person, place, and time.  Psychiatric:        Mood and Affect: Mood normal.        Behavior: Behavior normal.        Thought Content: Thought content normal.        Judgment: Judgment  normal.      No results found for any visits on 08/06/23.  Last CBC Lab Results  Component Value Date   WBC 10.5 05/14/2023   HGB 12.0 05/14/2023   HCT 38.8 05/14/2023   MCV 66.4 Repeated and verified X2. (L) 05/14/2023   MCH 21.8 (L) 12/28/2016   RDW 15.3 05/14/2023   PLT 325.0 05/14/2023   Last metabolic panel Lab Results  Component Value Date   GLUCOSE 68 (L) 05/14/2023   NA 141 05/14/2023   K 4.0 05/14/2023   CL 104 05/14/2023   CO2 28 05/14/2023   BUN 12 05/14/2023   CREATININE 0.78 05/14/2023   GFR 88.72 05/14/2023   CALCIUM 9.4 05/14/2023   PROT 6.8 05/14/2023   ALBUMIN 4.0 05/14/2023   LABGLOB 3.1 10/26/2016   AGRATIO 1.3 10/26/2016   BILITOT 0.6 05/14/2023   ALKPHOS 76 05/14/2023   AST 17 05/14/2023   ALT 15 05/14/2023   ANIONGAP 7 03/16/2016   Last lipids Lab Results  Component Value Date   CHOL 141 05/14/2023   HDL 57.60 05/14/2023   LDLCALC 71 05/14/2023   TRIG 60.0 05/14/2023   CHOLHDL 2 05/14/2023   Last hemoglobin A1c Lab Results  Component Value Date   HGBA1C 5.9 05/14/2023   Last thyroid functions Lab Results  Component Value Date   TSH 1.08 05/14/2023   Last vitamin D Lab Results  Component Value Date   VD25OH 38.96 05/14/2023   Last vitamin B12 and Folate No results found for: "VITAMINB12", "FOLATE"    The 10-year ASCVD risk score (Arnett DK, et al., 2019) is: 1.8%    Assessment & Plan:   Problem List Items Addressed This Visit   None Visit Diagnoses     Bronchitis    -  Primary   Relevant Medications   predniSONE (DELTASONE) 10 MG tablet  amoxicillin-clavulanate (AUGMENTIN) 875-125 MG tablet   methylPREDNISolone acetate (DEPO-MEDROL) injection 80 mg (Completed)   Other Relevant Orders   CBC with Differential/Platelet   Comprehensive metabolic panel     Assessment and Plan    Chronic Cough   She exhibits a persistent cough with clear sputum production, unresponsive to doxycycline, prednisone, and  promethazine DM, without wheezing or chest tightness. The differential diagnosis includes post-viral cough, allergic rhinitis, or an undiagnosed infection. We discussed the risks and benefits of current versus new treatments and the potential need for a chest CT if there is no improvement by Thursday. We will administer an intramuscular injection and start antibiotics tonight and prednisone tomorrow. Augmentin and a prednisone taper are prescribed, along with promethazine DM at night. For daytime, Delsym or Mucinex DM is recommended. She should report progress by Thursday; a chest CT will be considered if there is no improvement.  Allergic Rhinitis   She continues to experience nasal congestion and rhinorrhea despite using Nasonex and daily Zyrtec. We discussed the continued use of these medications and the potential need for further evaluation if symptoms persist. Nasonex and daily Zyrtec will be continued.  Follow-up   A follow-up via MyChart on Thursday to report progress is scheduled. A chest CT will be ordered if there is no improvement by Thursday.        No follow-ups on file.    Donato Schultz, DO

## 2023-08-20 ENCOUNTER — Other Ambulatory Visit: Payer: Self-pay | Admitting: Family Medicine

## 2023-08-20 ENCOUNTER — Encounter: Payer: Self-pay | Admitting: Family

## 2023-08-20 ENCOUNTER — Encounter: Payer: Self-pay | Admitting: Family Medicine

## 2023-08-20 ENCOUNTER — Telehealth: Payer: Self-pay | Admitting: Family Medicine

## 2023-08-20 MED ORDER — FLUCONAZOLE 150 MG PO TABS: 150.0000 mg | ORAL_TABLET | Freq: Every day | ORAL | 2 refills | Status: DC

## 2023-08-20 NOTE — Telephone Encounter (Signed)
Pt notified via mychart

## 2023-08-20 NOTE — Telephone Encounter (Signed)
Pt finished up antibiotic and believes she has developed a yeast infection. Please send medication in to [facility] on [address]

## 2023-08-20 NOTE — Telephone Encounter (Signed)
Error

## 2023-08-22 ENCOUNTER — Ambulatory Visit: Payer: PRIVATE HEALTH INSURANCE | Admitting: Family Medicine

## 2023-09-08 ENCOUNTER — Ambulatory Visit (HOSPITAL_COMMUNITY)
Admission: EM | Admit: 2023-09-08 | Discharge: 2023-09-08 | Disposition: A | Discharge status: Home / Self Care | Payer: PRIVATE HEALTH INSURANCE | Attending: Physician Assistant | Admitting: Physician Assistant

## 2023-09-08 ENCOUNTER — Encounter: Payer: Self-pay | Admitting: Family

## 2023-09-08 ENCOUNTER — Encounter (HOSPITAL_COMMUNITY): Payer: Self-pay | Admitting: Emergency Medicine

## 2023-09-08 DIAGNOSIS — R197 Diarrhea, unspecified: Secondary | ICD-10-CM | POA: Diagnosis present

## 2023-09-08 LAB — CLOSTRIDIUM DIFFICILE BY PCR, REFLEXED: Toxigenic C. Difficile by PCR: POSITIVE — AB

## 2023-09-08 LAB — C DIFFICILE QUICK SCREEN W PCR REFLEX
C Diff antigen: POSITIVE — AB
C Diff toxin: NEGATIVE

## 2023-09-08 LAB — POC COVID19/FLU A&B COMBO
Covid Antigen, POC: NEGATIVE
Influenza A Antigen, POC: NEGATIVE
Influenza B Antigen, POC: NEGATIVE

## 2023-09-08 MED ORDER — DICYCLOMINE HCL 20 MG PO TABS: 20.0000 mg | ORAL_TABLET | Freq: Three times a day (TID) | ORAL | 0 refills | Status: DC

## 2023-09-08 MED ORDER — ONDANSETRON 4 MG PO TBDP
4.0000 mg | ORAL_TABLET | Freq: Once | ORAL | Status: AC
  Administered 2023-09-08: 4 mg via ORAL

## 2023-09-08 MED ORDER — ONDANSETRON 4 MG PO TBDP
ORAL_TABLET | ORAL | Status: AC
  Filled 2023-09-08: qty 1

## 2023-09-08 MED ORDER — ONDANSETRON HCL 4 MG PO TABS: 4.0000 mg | ORAL_TABLET | Freq: Three times a day (TID) | ORAL | 0 refills | Status: DC | PRN

## 2023-09-08 NOTE — ED Provider Notes (Signed)
 [facility]   MRN: [id number] DOB: 12-19-1970  Subjective:   Laurie Monroe is a 53 y.o. female presenting for 11 days of diarrhea. Pt reports anytime she eats or drinks, it just goes right through her. She reports this weekend having at least 8 bowel movements yesterday, and at least 5 today prior to visit here. All liquid. No blood. Crampy abdominal pain. Some nausea, no vomiting. Some cough that has been lingering since the middle of November. Tired feeling. Denies body aches or fevers or chills. No dizziness. No headaches. No usual issues with her GI system. Reports coworkers have had 'stomach virus' recently. Colonoscopy 04/14/20 was normal. She did complete course of augmentin  last month for bronchitis cough.  No current facility-administered medications for this encounter.  Current Outpatient Medications:    dicyclomine  (BENTYL ) 20 MG tablet, Take 1 tablet (20 mg total) by mouth 3 (three) times daily before meals for 10 days., Disp: 30 tablet, Rfl: 0   ondansetron  (ZOFRAN ) 4 MG tablet, Take 1 tablet (4 mg total) by mouth every 8 (eight) hours as needed for nausea or vomiting., Disp: 20 tablet, Rfl: 0   albuterol  (VENTOLIN  HFA) 108 (90 Base) MCG/ACT inhaler, INHALE 2 PUFFS EVERY 6 HOURS AS NEEDED FOR WHEEZING OR SHORTNESS OF BREATH, Disp: 8.5 g, Rfl: 1   amoxicillin -clavulanate (AUGMENTIN ) 875-125 MG tablet, Take 1 tablet by mouth 2 (two) times daily. (Patient not taking: Reported on 09/08/2023), Disp: 20 tablet, Rfl: 0   cetirizine  (ZYRTEC ) 10 MG tablet, TAKE 1 TABLET(10 MG) BY MOUTH DAILY, Disp: 90 tablet, Rfl: 1   fluconazole  (DIFLUCAN ) 150 MG tablet, Take 1 tablet (150 mg total) by mouth daily. May repeat in 2 days as needed (Patient not taking: Reported on 09/08/2023), Disp: 3 tablet, Rfl: 2   furosemide  (LASIX ) 40 MG tablet, TAKE 1 TABLET(40 MG) BY MOUTH DAILY, Disp: 90 tablet, Rfl: 1   mometasone  (NASONEX ) 50 MCG/ACT nasal spray, Place 2 sprays into the nose at bedtime.  As needed., Disp: 17 g, Rfl: 3   montelukast  (SINGULAIR ) 10 MG tablet, TAKE 1 TABLET(10 MG) BY MOUTH AT BEDTIME, Disp: 90 tablet, Rfl: 1   MULTIPLE VITAMIN PO, Take 1 tablet by mouth daily. , Disp: , Rfl:    predniSONE  (DELTASONE ) 10 MG tablet, TAKE 3 TABLETS PO QD FOR 3 DAYS THEN TAKE 2 TABLETS PO QD FOR 3 DAYS THEN TAKE 1 TABLET PO QD FOR 3 DAYS THEN TAKE 1/2 TAB PO QD FOR 3 DAYS (Patient not taking: Reported on 09/08/2023), Disp: 20 tablet, Rfl: 0   promethazine -dextromethorphan (PROMETHAZINE -DM) 6.25-15 MG/5ML syrup, Take 5 mLs by mouth 4 (four) times daily as needed for cough. (Patient not taking: Reported on 09/08/2023), Disp: 118 mL, Rfl: 0   Vitamin D , Ergocalciferol , (DRISDOL ) 1.25 MG (50000 UNIT) CAPS capsule, Take 1 capsule (50,000 Units total) by mouth every 7 (seven) days., Disp: 12 capsule, Rfl: 1   No Known Allergies  Past Medical History:  Diagnosis Date   Allergy    Anemia    Chicken pox    Epilepsy (HCC)    Hx of Epilepsy. 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   Tricuspid regurgitation      Past Surgical History:  Procedure Laterality Date   ABDOMINAL HYSTERECTOMY N/A 03/28/2016   Procedure: HYSTERECTOMY ABDOMINAL;  Surgeon: Krystal  Meisinger, MD;  Location: [facility];  Service: Gynecology;  Laterality: N/A;   BILATERAL SALPINGECTOMY Bilateral 03/28/2016   Procedure: BILATERAL SALPINGECTOMY;  Surgeon: Krystal Deaner, MD;  Location: [facility];  Service: Gynecology;  Laterality: Bilateral;   DILATION AND CURETTAGE OF UTERUS     Heavy Periods   VARICOSE VEIN SURGERY      Family History  Problem Relation Age of Onset   Hypertension Mother    Brain cancer Mother    Prostate cancer Father    Hypertension Father    Liver cancer Father    Stomach cancer Father    Heart disease Father    Hypertension Brother        Twin Brother   Heart  disease Brother 43       coronary stent    Social History   Tobacco Use   Smoking status: Never   Smokeless tobacco: Never  Substance Use Topics   Alcohol use: No    Alcohol/week: 0.0 standard drinks of alcohol   Drug use: No    ROS REFER TO HPI FOR PERTINENT POSITIVES AND NEGATIVES   Objective:   Vitals: BP 124/82 (BP Location: Right Arm)   Pulse 92   Temp 100.3 F (37.9 C) (Oral)   Resp 17   LMP 03/28/2016 (Exact Date)   SpO2 97%   Physical Exam Vitals and nursing note reviewed.  Constitutional:      Appearance: Normal appearance. She is normal weight. She is ill-appearing. She is not toxic-appearing.  HENT:     Head: Normocephalic and atraumatic.     Right Ear: Tympanic membrane, ear canal and external ear normal.     Left Ear: Tympanic membrane, ear canal and external ear normal.     Nose: Nose normal.     Mouth/Throat:     Mouth: Mucous membranes are moist.  Eyes:     Extraocular Movements: Extraocular movements intact.     Conjunctiva/sclera: Conjunctivae normal.     Pupils: Pupils are equal, round, and reactive to light.  Cardiovascular:     Rate and Rhythm: Normal rate and regular rhythm.     Pulses: Normal pulses.     Heart sounds: Normal heart sounds.  Pulmonary:     Effort: Pulmonary effort is normal.     Breath sounds: Normal breath sounds.  Abdominal:     General: Abdomen is flat. Bowel sounds are normal.     Palpations: Abdomen is soft.     Tenderness: There is generalized abdominal tenderness. There is no right CVA tenderness, left CVA tenderness, guarding or rebound. Negative signs include Murphy's sign and McBurney's sign.  Musculoskeletal:        General: Normal range of motion.     Cervical back: Normal range of motion and neck supple.  Skin:    General: Skin is warm and dry.  Neurological:     General: No focal deficit present.     Mental Status: She is alert and oriented to person, place, and time.  Psychiatric:        Mood and  Affect: Mood normal.        Behavior: Behavior normal.        Thought Content: Thought content normal.        Judgment: Judgment normal.     Results for orders placed or performed during the hospital encounter of 09/08/23 (from the past 24 hours)  POC Covid19/Flu A&B Antigen     Status: None   Collection Time: 09/08/23  3:14 PM  Result Value Ref Range   Influenza A Antigen, POC Negative Negative   Influenza B Antigen, POC Negative Negative   Covid Antigen, POC Negative Negative    Assessment and Plan :   PDMP not reviewed this encounter.  1. Diarrhea, unspecified type    53 year old female with diarrhea for the past 11 days.  She was able to provide a stool sample for us  today, we will send this out for testing and treat accordingly.  Her COVID-19 and flu test were both negative.  She was reassured of this.  She was given prescriptions for dicyclomine  and Zofran  to take at home as directed.  Generalized abdominal tenderness noted on exam, certainly not an acute abdomen.  Shared decision making, did not feel that imaging was warranted at this time.  I am suspicious of possible C. difficile infection as she did complete a course of Augmentin  last month.  She is going to follow-up with her PCP.  Recommended that she stay well-hydrated, BRAT diet. Vital signs were stable on exam today. Strict ER precautions discussed.     AllwardtMardy HERO, PA-C 09/08/23 1526

## 2023-09-08 NOTE — ED Triage Notes (Signed)
 Pt c/o "intense abdominal pain" followed by diarrhea  since christmas.  She has been taking imodium at home with no relief

## 2023-09-08 NOTE — Discharge Instructions (Signed)
 Good to meet you today.  Your COVID and flu tests were both negative.  I suspect you have a gastrointestinal infection.  We have sent a stool sample out and will let you know of your results if anything comes back positive.  You may take Bentyl  as directed at home for abdominal cramping.  You may take Zofran  as needed for nausea.  Please try to keep well-hydrated.  Recommend BRAT diet (bananas, rice, applesauce, toast) until symptoms resolve.  You need to follow-up with your primary care provider this week.  You may take Tylenol  or ibuprofen  to help with discomfort.  Very low threshold to present to the emergency department if any severe abdominal pain, blood in the stool or vomit, lightheadedness, or sudden worsening symptoms.

## 2023-09-09 ENCOUNTER — Telehealth: Payer: Self-pay

## 2023-09-09 ENCOUNTER — Other Ambulatory Visit: Payer: Self-pay | Admitting: Family Medicine

## 2023-09-09 ENCOUNTER — Encounter: Payer: Self-pay | Admitting: Family

## 2023-09-09 DIAGNOSIS — A0472 Enterocolitis due to Clostridium difficile, not specified as recurrent: Secondary | ICD-10-CM

## 2023-09-09 MED ORDER — DIFICID 200 MG PO TABS: 200.0000 mg | ORAL_TABLET | Freq: Two times a day (BID) | ORAL | 0 refills | Status: DC

## 2023-09-09 NOTE — Telephone Encounter (Signed)
-----   Message from Laurie Monroe sent at 09/09/2023 10:22 AM EST ----- Pt was in er for diarrhea----  + c diff I sent abx in to pharmacy-- please inform pt by phone

## 2023-09-09 NOTE — Progress Notes (Unsigned)
   Established Patient Office Visit  Subjective   Patient ID: Ryelynn Guedea Malcomb, female    DOB: 05-Jan-1971  Age: 53 y.o. MRN: [id number]  No chief complaint on file.   HPI  {History (Optional):23778}  ROS    Objective:     LMP 03/28/2016 (Exact Date)  {Vitals History (Optional):23777}  Physical Exam   No results found for any visits on 09/09/23.  {Labs (Optional):23779}  The 10-year ASCVD risk score (Arnett DK, et al., 2019) is: 2.1%    Assessment & Plan:   Problem List Items Addressed This Visit   None Visit Diagnoses       C. difficile diarrhea    -  Primary   Relevant Medications   fidaxomicin  (DIFICID ) 200 MG TABS tablet       No follow-ups on file.    Eyob Godlewski R Lowne Chase, DO

## 2023-09-09 NOTE — Telephone Encounter (Signed)
 Called patient and LVM letting them know a medication was sent in for them. If they have any questions to give [place] a call

## 2023-09-10 ENCOUNTER — Ambulatory Visit (INDEPENDENT_AMBULATORY_CARE_PROVIDER_SITE_OTHER): Payer: PRIVATE HEALTH INSURANCE | Admitting: Family Medicine

## 2023-09-10 VITALS — BP 132/72 | HR 76 | Temp 98.8°F | Resp 18 | Ht 66.0 in | Wt 257.0 lb

## 2023-09-10 DIAGNOSIS — A0472 Enterocolitis due to Clostridium difficile, not specified as recurrent: Secondary | ICD-10-CM

## 2023-09-10 DIAGNOSIS — R051 Acute cough: Secondary | ICD-10-CM | POA: Diagnosis not present

## 2023-09-10 MED ORDER — PREDNISONE 10 MG PO TABS: ORAL_TABLET | ORAL | 0 refills | Status: DC

## 2023-09-10 NOTE — Assessment & Plan Note (Signed)
 Cxr neg Pred taper  Cough med as needed

## 2023-09-10 NOTE — Progress Notes (Signed)
 Established Patient Office Visit  Subjective   Patient ID: Laurie Monroe, female    DOB: Jun 10, 1971  Age: 53 y.o. MRN: [id number]  Chief Complaint  Patient presents with   Follow-up    HPI Pt here to f/u er for diarrhea.  + c diff   pt had difficulty getting med --- ID recommended fidaxomicin  --- she was able to finally get it today.  Pt was seen in =[facility] and stool cultures done  Pt still with diarrhea ,   no abd pain No other compiants except she still has the cough that is contant She had been on Augmentin  when diarrhea started  Patient Active Problem List   Diagnosis Date Noted   C. 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 No seizures in over 20 years   GERD (gastroesophageal reflux disease)    Leukocytosis 12/28/2015   Seizures (HCC)    last episode 20 years  ago   Thalassemia trait, alpha 12/28/2015   Tricuspid regurgitation    Past Surgical History:  Procedure Laterality Date   ABDOMINAL HYSTERECTOMY N/A 03/28/2016   Procedure: HYSTERECTOMY ABDOMINAL;  Surgeon: Krystal Deaner, MD;  Location: [facility];  Service: Gynecology;  Laterality: N/A;   BILATERAL SALPINGECTOMY Bilateral 03/28/2016   Procedure: BILATERAL SALPINGECTOMY;  Surgeon: Krystal Deaner, MD;  Location: [facility];  Service: Gynecology;  Laterality: Bilateral;   DILATION AND CURETTAGE OF UTERUS     Heavy Periods   VARICOSE VEIN SURGERY     Social History   Tobacco Use   Smoking status: Never   Smokeless tobacco: Never  Substance Use Topics   Alcohol use: No    Alcohol/week: 0.0 standard drinks of alcohol   Drug use: No   Social History   Socioeconomic History   Marital status: Single    Spouse name: Not on file   Number of children: Not on file   Years of education: Not on file   Highest education level: Not on file  Occupational History   Occupation: CNA    Employer: [employer]  Tobacco Use   Smoking status: Never   Smokeless tobacco: Never  Substance and Sexual Activity   Alcohol use: No    Alcohol/week: 0.0 standard drinks of alcohol   Drug use: No   Sexual activity: Not Currently    Partners: Male    Birth control/protection:  None  Other Topics Concern   Not on file  Social History Narrative   Exercise ---- no except walking at work    Lives with her daughter    Social Drivers of Corporate Investment Banker Strain: Not on file  Food Insecurity: Not on file  Transportation Needs: Not on file  Physical Activity: Not on file  Stress: Not on file  Social Connections: Not on file  Intimate Partner Violence: Not on file   Family Status  Relation Name Status   Mother  Deceased at age 33   Father  Alive   Sister  Alive   Brother  Alive   Brother  Alive   Daughter  Alive  No partnership data on file   Family History  Problem Relation Age of Onset    Hypertension Mother    Brain cancer Mother    Prostate cancer Father    Hypertension Father    Liver cancer Father    Stomach cancer Father    Heart disease Father    Hypertension Brother        Twin Brother   Heart disease Brother 28       coronary stent   No Known Allergies    Review of Systems  Constitutional:  Negative for fever.  HENT:  Negative for congestion.   Eyes:  Negative for blurred vision.  Respiratory:  Negative for cough.   Cardiovascular:  Negative for chest pain and palpitations.  Gastrointestinal:  Positive for diarrhea. Negative for abdominal pain and vomiting.  Musculoskeletal:  Negative for back pain.  Skin:  Negative for rash.  Neurological:  Negative for loss of consciousness and headaches.      Objective:     BP 132/72   Pulse 76   Temp 98.8 F (37.1 C)   Resp 18   Ht 5' 6 (1.676 m)   Wt 257 lb (116.6 kg)   LMP 03/28/2016 (Exact Date)   BMI 41.48 kg/m  BP Readings from Last 3 Encounters:  09/10/23 132/72  09/08/23 124/82  08/06/23 120/88   Wt Readings from Last 3 Encounters:  09/10/23 257 lb (116.6 kg)  08/06/23 260 lb (117.9 kg)  07/25/23 266 lb 8 oz (120.9 kg)   SpO2 Readings from Last 3 Encounters:  09/08/23 97%  08/06/23 94%  07/25/23 97%      Physical Exam Vitals and nursing note reviewed.  Constitutional:      General: She is not in acute distress.    Appearance: Normal appearance. She is well-developed.  HENT:     Head: Normocephalic and atraumatic.  Eyes:     General: No scleral icterus.       Right eye: No discharge.        Left eye: No discharge.  Cardiovascular:     Rate and Rhythm: Normal rate and regular rhythm.     Heart sounds: No murmur heard. Pulmonary:     Effort: Pulmonary effort is normal. No respiratory distress.     Breath sounds: Normal breath sounds.  Musculoskeletal:        General: Normal range of motion.     Cervical back: Normal range of motion and neck supple.     Right lower leg: No  edema.     Left lower leg: No edema.  Skin:    General: Skin is warm and dry.  Neurological:     Mental Status: She is alert and oriented to person, place, and time.  Psychiatric:  Mood and Affect: Mood normal.        Behavior: Behavior normal.        Thought Content: Thought content normal.        Judgment: Judgment normal.      No results found for any visits on 09/10/23.  Last CBC Lab Results  Component Value Date   WBC 10.5 05/14/2023   HGB 12.0 05/14/2023   HCT 38.8 05/14/2023   MCV 66.4 Repeated and verified X2. (L) 05/14/2023   MCH 21.8 (L) 12/28/2016   RDW 15.3 05/14/2023   PLT 325.0 05/14/2023   Last metabolic panel Lab Results  Component Value Date   GLUCOSE 68 (L) 05/14/2023   NA 141 05/14/2023   K 4.0 05/14/2023   CL 104 05/14/2023   CO2 28 05/14/2023   BUN 12 05/14/2023   CREATININE 0.78 05/14/2023   GFR 88.72 05/14/2023   CALCIUM 9.4 05/14/2023   PROT 6.8 05/14/2023   ALBUMIN 4.0 05/14/2023   LABGLOB 3.1 10/26/2016   AGRATIO 1.3 10/26/2016   BILITOT 0.6 05/14/2023   ALKPHOS 76 05/14/2023   AST 17 05/14/2023   ALT 15 05/14/2023   ANIONGAP 7 03/16/2016   Last lipids Lab Results  Component Value Date   CHOL 141 05/14/2023   HDL 57.60 05/14/2023   LDLCALC 71 05/14/2023   TRIG 60.0 05/14/2023   CHOLHDL 2 05/14/2023   Last hemoglobin A1c Lab Results  Component Value Date   HGBA1C 5.9 05/14/2023   Last thyroid functions Lab Results  Component Value Date   TSH 1.08 05/14/2023   Last vitamin D  Lab Results  Component Value Date   VD25OH 38.96 05/14/2023   Last vitamin B12 and Folate No results found for: VITAMINB12, FOLATE    The 10-year ASCVD risk score (Arnett DK, et al., 2019) is: 2.6%    Assessment & Plan:   Problem List Items Addressed This Visit       Unprioritized   Cough   Cxr neg Pred taper  Cough med as needed       Relevant Medications   predniSONE  (DELTASONE ) 10 MG tablet   C. difficile diarrhea -  Primary   Fidaxomicin  --- will start today If no improvement refer GI  ?ID        No follow-ups on file.    Shakiyla Kook R Lowne Chase, DO

## 2023-09-10 NOTE — Assessment & Plan Note (Signed)
 Fidaxomicin --- will start today If no improvement refer GI  ?ID

## 2023-09-11 ENCOUNTER — Encounter: Payer: Self-pay | Admitting: Family Medicine

## 2023-09-11 ENCOUNTER — Encounter: Payer: Self-pay | Admitting: Family

## 2023-09-14 LAB — STOOL CULTURE: E coli, Shiga toxin Assay: NEGATIVE

## 2023-09-14 LAB — STOOL CULTURE REFLEX - RSASHR

## 2023-09-14 LAB — STOOL CULTURE REFLEX - CMPCXR

## 2023-10-18 ENCOUNTER — Ambulatory Visit: Payer: PRIVATE HEALTH INSURANCE | Admitting: Family Medicine

## 2023-10-18 ENCOUNTER — Encounter: Payer: Self-pay | Admitting: Family Medicine

## 2023-10-18 ENCOUNTER — Ambulatory Visit (HOSPITAL_BASED_OUTPATIENT_CLINIC_OR_DEPARTMENT_OTHER)
Admission: RE | Admit: 2023-10-18 | Discharge: 2023-10-18 | Disposition: A | Discharge status: Home / Self Care | Payer: Self-pay | Source: Ambulatory Visit | Attending: Family Medicine | Admitting: Family Medicine

## 2023-10-18 ENCOUNTER — Encounter: Payer: Self-pay | Admitting: Family

## 2023-10-18 VITALS — BP 136/88 | HR 80 | Temp 98.6°F | Resp 18 | Ht 66.0 in | Wt 269.2 lb

## 2023-10-18 DIAGNOSIS — I10 Essential (primary) hypertension: Secondary | ICD-10-CM

## 2023-10-18 DIAGNOSIS — Z8249 Family history of ischemic heart disease and other diseases of the circulatory system: Secondary | ICD-10-CM

## 2023-10-18 DIAGNOSIS — G8929 Other chronic pain: Secondary | ICD-10-CM | POA: Insufficient documentation

## 2023-10-18 DIAGNOSIS — M25561 Pain in right knee: Secondary | ICD-10-CM

## 2023-10-18 MED ORDER — MELOXICAM 15 MG PO TABS: ORAL_TABLET | ORAL | 2 refills | Status: DC

## 2023-10-18 MED ORDER — METHOCARBAMOL 500 MG PO TABS: 500.0000 mg | ORAL_TABLET | Freq: Four times a day (QID) | ORAL | 1 refills | Status: DC

## 2023-10-18 MED ORDER — ZEPBOUND 2.5 MG/0.5ML ~~LOC~~ SOAJ: 2.5000 mg | SUBCUTANEOUS | 0 refills | Status: DC

## 2023-10-18 NOTE — Progress Notes (Signed)
Established Patient Office Visit  Subjective   Patient ID: Laurie Monroe, female    DOB: 1970/10/25  Age: 53 y.o. MRN: [id number]  Chief Complaint  Patient presents with   Knee Pain    Right knee pain, x1 week, no falls or injuries     HPI Discussed the use of AI scribe software for clinical note transcription with the patient, who gave verbal consent to proceed.  History of Present Illness   Laurie Monroe is a 53 year old female who presents with right knee pain.  She has been experiencing right knee pain for about a week. 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 The pain is described as throbbing at night and is not linked to any specific injury or fall. It is exacerbated by activities such as climbing stairs, with more difficulty going up than down. Ibuprofen provides some relief but is not long-lasting, and she sometimes wakes up in pain despite taking it. She perceives intermittent swelling in the knee, although she is unsure due to her leg size. She has a history of hopping to the bathroom in the morning due to knee discomfort, which resolves once she moves around.  Her work environment is stressful due to being understaffed, requiring her to work extra hard. She is on her feet constantly as a CNA, which may contribute to her knee pain.  Family history reveals that her twin brother and father have heart issues, with her father having a defibrillator. Her mother had both knees replaced before she passed away.      Patient Active Problem List   Diagnosis Date Noted   C. 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   Tricuspid regurgitation    Past Surgical History:  Procedure Laterality Date   ABDOMINAL HYSTERECTOMY N/A 03/28/2016   Procedure: HYSTERECTOMY ABDOMINAL;  Surgeon: Lavina Hamman, MD;  Location: [facility];  Service: Gynecology;  Laterality: N/A;   BILATERAL SALPINGECTOMY Bilateral 03/28/2016   Procedure: BILATERAL SALPINGECTOMY;  Surgeon: Tawanna Cooler  Meisinger, MD;  Location: [facility];  Service: Gynecology;  Laterality: Bilateral;   DILATION AND CURETTAGE OF UTERUS     Heavy Periods   VARICOSE VEIN SURGERY     Social History   Tobacco Use   Smoking status: Never   Smokeless tobacco: Never  Substance Use Topics   Alcohol use: No    Alcohol/week: 0.0 standard drinks of alcohol   Drug use: No   Social History   Socioeconomic History   Marital status: Single    Spouse name: Not on file   Number of children: Not  on file   Years of education: Not on file   Highest education level: Not on file  Occupational History   Occupation: CNA    Employer: [employer]  Tobacco Use   Smoking status: Never   Smokeless tobacco: Never  Substance and Sexual Activity   Alcohol use: No    Alcohol/week: 0.0 standard drinks of alcohol   Drug use: No   Sexual activity: Not Currently    Partners: Male    Birth control/protection: None  Other Topics Concern   Not on file  Social History Narrative   Exercise ---- no except walking at work    Lives with her daughter    Social Drivers of Corporate investment banker Strain: Not on file  Food Insecurity: Not on file  Transportation Needs: Not on file  Physical Activity: Not on file  Stress: Not on file  Social Connections: Not on file  Intimate Partner Violence: Not on file   Family Status  Relation Name Status   Mother  Deceased at age 33   Father  Alive   Sister  Alive   Brother  Alive   Brother  Alive   Daughter  Alive  No partnership data on file   Family History  Problem Relation Age of Onset   Hypertension Mother    Brain cancer Mother    Prostate cancer Father    Hypertension Father    Liver cancer Father    Stomach cancer Father    Heart disease Father    Hypertension Brother        Twin Brother   Heart disease Brother 32       coronary stent   No Known Allergies    Review of Systems  Constitutional:  Negative for fever and malaise/fatigue.  HENT:  Negative for congestion.   Eyes:  Negative for blurred vision.  Respiratory:  Negative for cough and shortness of breath.   Cardiovascular:  Negative for chest pain, palpitations and leg swelling.  Gastrointestinal:  Negative for abdominal pain, blood in stool, nausea and vomiting.  Genitourinary:  Negative for dysuria and frequency.  Musculoskeletal:  Positive for joint pain. Negative for back pain and falls.  Skin:  Negative for rash.  Neurological:  Negative for dizziness, loss  of consciousness and headaches.  Endo/Heme/Allergies:  Negative for environmental allergies.  Psychiatric/Behavioral:  Negative for depression. The patient is not nervous/anxious.       Objective:     BP 136/88 (BP Location: Left Arm, Patient Position: Sitting)   Pulse 80   Temp 98.6 F (37 C) (Oral)   Resp 18   Ht 5\' 6"  (1.676 m)   Wt 269 lb 3.2 oz (122.1 kg)   LMP 03/28/2016 (Exact Date)   SpO2 100%   BMI 43.45 kg/m  BP Readings from Last 3 Encounters:  10/18/23 136/88  09/10/23 132/72  09/08/23 124/82  Wt Readings from Last 3 Encounters:  10/18/23 269 lb 3.2 oz (122.1 kg)  09/10/23 257 lb (116.6 kg)  08/06/23 260 lb (117.9 kg)   SpO2 Readings from Last 3 Encounters:  10/18/23 100%  09/08/23 97%  08/06/23 94%      Physical Exam Vitals and nursing note reviewed.  Constitutional:      General: She is not in acute distress.    Appearance: Normal appearance. She is well-developed.  HENT:     Head: Normocephalic and atraumatic.  Eyes:     General: No scleral icterus.       Right eye: No discharge.        Left eye: No discharge.  Cardiovascular:     Rate and Rhythm: Normal rate and regular rhythm.     Heart sounds: No murmur heard. Pulmonary:     Effort: Pulmonary effort is normal. No respiratory distress.     Breath sounds: Normal breath sounds.  Musculoskeletal:        General: Swelling and tenderness present. Normal range of motion.     Cervical back: Normal range of motion and neck supple.     Right lower leg: No edema.     Left lower leg: No edema.  Skin:    General: Skin is warm and dry.  Neurological:     Mental Status: She is alert and oriented to person, place, and time.  Psychiatric:        Mood and Affect: Mood normal.        Behavior: Behavior normal.        Thought Content: Thought content normal.        Judgment: Judgment normal.      No results found for any visits on 10/18/23.  Last CBC Lab Results  Component Value Date   WBC  10.5 05/14/2023   HGB 12.0 05/14/2023   HCT 38.8 05/14/2023   MCV 66.4 Repeated and verified X2. (L) 05/14/2023   MCH 21.8 (L) 12/28/2016   RDW 15.3 05/14/2023   PLT 325.0 05/14/2023   Last metabolic panel Lab Results  Component Value Date   GLUCOSE 68 (L) 05/14/2023   NA 141 05/14/2023   K 4.0 05/14/2023   CL 104 05/14/2023   CO2 28 05/14/2023   BUN 12 05/14/2023   CREATININE 0.78 05/14/2023   GFR 88.72 05/14/2023   CALCIUM 9.4 05/14/2023   PROT 6.8 05/14/2023   ALBUMIN 4.0 05/14/2023   LABGLOB 3.1 10/26/2016   AGRATIO 1.3 10/26/2016   BILITOT 0.6 05/14/2023   ALKPHOS 76 05/14/2023   AST 17 05/14/2023   ALT 15 05/14/2023   ANIONGAP 7 03/16/2016   Last lipids Lab Results  Component Value Date   CHOL 141 05/14/2023   HDL 57.60 05/14/2023   LDLCALC 71 05/14/2023   TRIG 60.0 05/14/2023   CHOLHDL 2 05/14/2023   Last hemoglobin A1c Lab Results  Component Value Date   HGBA1C 5.9 05/14/2023   Last thyroid functions Lab Results  Component Value Date   TSH 1.08 05/14/2023   Last vitamin D Lab Results  Component Value Date   VD25OH 38.96 05/14/2023   Last vitamin B12 and Folate No results found for: "VITAMINB12", "FOLATE"    The 10-year ASCVD risk score (Arnett DK, et al., 2019) is: 3%    Assessment & Plan:   Problem List Items Addressed This Visit       Unprioritized   HTN (hypertension)   Relevant Medications   tirzepatide (ZEPBOUND) 2.5 MG/0.5ML  Pen   Morbid obesity (HCC)   Relevant Medications   tirzepatide (ZEPBOUND) 2.5 MG/0.5ML Pen   tirzepatide (ZEPBOUND) 2.5 MG/0.5ML Pen   Other Visit Diagnoses       Chronic pain of right knee    -  Primary   Relevant Medications   methocarbamol (ROBAXIN) 500 MG tablet   meloxicam (MOBIC) 15 MG tablet   Other Relevant Orders   DG Knee Complete 4 Views Right     Family history of early CAD       Relevant Medications   tirzepatide (ZEPBOUND) 2.5 MG/0.5ML Pen     Assessment and Plan    Right Knee  Pain Intermittent right knee pain has persisted for one week, worsening with weight-bearing activities. The pain is throbbing, particularly at night, accompanied by mild swelling. There is no history of trauma. 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 Differential diagnosis includes osteoarthritis and possible fluid accumulation. Methocarbamol for muscle relaxation and meloxicam for anti-inflammatory effects were discussed, noting methocarbamol's potential sedative effects on work performance. Rest, elevation, and icing the knee were emphasized. A work absence extension and specialist referral will be considered if no improvement occurs. A right knee x-ray is ordered. Methocarbamol is prescribed up to four times daily, and meloxicam once daily. Ice the knee for 15-20 minutes at night. A work absence note is provided for today, with advice to rest and elevate the knee over the weekend. If symptoms persist by Monday, consider extending work absence and specialist referral.  Obesity Acknowledgment of overweight status and its potential impact on knee pain was made. Weight loss medication and insurance coverage were discussed. Emphasis was placed on dietary modifications and physical activity as tolerated. Weight loss strategies and potential medications will be discussed with insurance coverage. Dietary modifications and physical activity are encouraged as tolerated.  General Health Maintenance With a family history of heart disease, the importance of weight management for overall health was discussed. Regular follow-up for cardiovascular risk assessment is encouraged, along with lifestyle modifications for weight management.  Follow-up X-ray results will be followed up by Monday. Referral to a specialist and extension of work absence will be considered if symptoms persist.        No follow-ups on file.    Donato Schultz, DO

## 2023-10-18 NOTE — Patient Instructions (Signed)
Chronic Knee Pain, Adult Knee pain that lasts longer than 3 months is called chronic knee pain. You may have pain in one or both knees. Symptoms of chronic knee pain may also include swelling and stiffness. Many conditions can cause chronic knee pain. The most common cause is wear and tear of your knee joint as you get older. Other possible causes include: A disease that causes inflammation of the knee, such as rheumatoid arthritis. This usually affects both knees. A condition called inflammatory arthritis, such as gout. An injury to the knee that causes arthritis. An injury to the knee that damages the ligaments. Ligaments are tissues that connect bones to each other. Runner's knee or pain behind the kneecap. Treatment for chronic knee pain depends on the cause. The main treatments for chronic knee pain are: Doing exercises to help your knee move better and get stronger, called physical therapy. Losing weight if you are overweight. This condition may also be treated with medicines, injections, a knee sleeve or brace, and by using crutches. You health care provider may also recommend rest, ice, pressure (compression), and elevation, also called RICE therapy. Follow these instructions at home: If you have a knee sleeve or brace that can be taken off:  Wear the knee sleeve or brace as told by your provider. Take it off only if your provider says that you can. Check the skin around it every day. Tell your provider if you see problems. Loosen the knee sleeve or brace if your toes tingle, are numb, or turn cold and blue. Keep the knee sleeve or brace clean and dry. Bathing If the knee sleeve or brace is not waterproof: Do not let it get wet. Cover it when you take a bath or shower. Use a cover that does not let any water in. Managing pain, stiffness, and swelling     If told, put heat on the area. Do this as often as told. Use the heat source that your provider recommends, such as a moist  heat pack or a heating pad. If you have a knee sleeve or brace that you can take off, remove it as told. Place a towel between your skin and the heat source. Leave the heat on for 20-30 minutes. If told, put ice on the area. If you have a knee sleeve or brace that you can take off, remove it as told. Put ice in a plastic bag. Place a towel between your skin and the bag. Leave the ice on for 20 minutes, 2-3 times a day. If your skin turns bright red, remove the ice or heat right away to prevent skin damage. The risk of damage is higher if you cannot feel pain, heat, or cold. Move your toes often to reduce stiffness and swelling. Raise the injured area above the level of your heart while you are sitting or lying down. Use a pillow to support your foot as needed. Activity Avoid activities where both feet leave the ground at the same time. Avoid running, jumping rope, and doing jumping jacks. Follow the exercise plan that your provider made for you. Your provider may suggest that you: Avoid activities that make knee pain worse. This may mean that you need to change your exercise routines, sports, or job duties. Wear shoes with cushioned soles. Avoid sports that require running and sudden changes in direction. Do physical therapy. Physical therapy helps your knee move better and get stronger. Exercise as told. Do exercises that increase balance and strength, such as  tai chi and yoga. Do not stand or walk on your injured knee until you're told it's okay. Use crutches as told. Return to normal activities when you're told. Ask what things are safe for you to do. General instructions Take your medicines only as told by your provider. If you are overweight, work with your provider and an expert in healthy eating called a dietitian to set goals to lose weight. Losing even a little weight can reduce knee pain. Being overweight can make your knee hurt more. Do not smoke, vape, or use products with  nicotine or tobacco in them. If you need help quitting, talk with your provider. Keep all follow-up visits. Your provider will monitor your pain and try other treatments if needed. Contact a health care provider if: You have knee pain that is not getting better or gets worse. You are not able to do your exercises due to knee pain. Get help right away if: Your knee swells and the swelling becomes worse. You cannot move your knee. You have severe knee pain. This information is not intended to replace advice given to you by your health care provider. Make sure you discuss any questions you have with your health care provider. Document Revised: 05/23/2023 Document Reviewed: 10/15/2022 Elsevier Patient Education  2024 [employer].

## 2023-10-20 ENCOUNTER — Other Ambulatory Visit: Payer: Self-pay | Admitting: Medical

## 2023-10-20 DIAGNOSIS — J302 Other seasonal allergic rhinitis: Secondary | ICD-10-CM

## 2023-10-22 ENCOUNTER — Other Ambulatory Visit: Payer: Self-pay | Admitting: Medical

## 2023-10-22 DIAGNOSIS — J302 Other seasonal allergic rhinitis: Secondary | ICD-10-CM

## 2023-10-23 ENCOUNTER — Other Ambulatory Visit: Payer: Self-pay | Admitting: Family Medicine

## 2023-10-23 DIAGNOSIS — J302 Other seasonal allergic rhinitis: Secondary | ICD-10-CM

## 2023-11-01 ENCOUNTER — Telehealth: Payer: Self-pay

## 2023-11-01 ENCOUNTER — Encounter: Payer: Self-pay | Admitting: Family Medicine

## 2023-11-01 NOTE — Telephone Encounter (Signed)
 Reading room called. They are switching the order to STAT. Imaging not read yet

## 2023-11-01 NOTE — Telephone Encounter (Signed)
 Copied from CRM [id number]. Topic: Clinical - Lab/Test Results >> Oct 31, 2023  4:51 PM Melissa C wrote: Reason for CRM: patient had x-ray done on her knee a few weeks ago and is unable to pull up results on mychart. Would like someone to give her a call with results please. Thank you

## 2023-11-05 ENCOUNTER — Telehealth: Payer: Self-pay

## 2023-11-05 NOTE — Telephone Encounter (Signed)
 Copied from CRM [id number]. Topic: Clinical - Lab/Test Results >> Nov 05, 2023  4:06 PM Laurie Monroe wrote: Reason for CRM: Patient is needing her xray results and is requesting a callback.

## 2023-11-06 NOTE — Telephone Encounter (Signed)
Pt viewed via Mychart 

## 2024-01-23 ENCOUNTER — Encounter: Payer: Self-pay | Admitting: Nurse Practitioner

## 2024-01-23 ENCOUNTER — Ambulatory Visit: Payer: Self-pay | Admitting: Nurse Practitioner

## 2024-01-23 VITALS — BP 132/78 | HR 80

## 2024-01-23 DIAGNOSIS — L309 Dermatitis, unspecified: Secondary | ICD-10-CM

## 2024-01-23 NOTE — Progress Notes (Signed)
 Acute Office Visit  Subjective:     Patient ID: Laurie Monroe, female    DOB: 05-26-1971, 54 y.o.   MRN: [id number]  Chief Complaint  Patient presents with   Rash    Patient is in today for c/o of rash that started this week, noticed Monday. Only uses lotion on skin (bath and body work- japanese cherry blossom) Rash is only present in right dorsal forearm, itches. Has not tried anything over the counter to help symptoms.  Denies any changes in detergents or soaps. Uses Dove soap and tide.   Review of Systems  Skin:  Positive for itching and rash.        Objective:    BP 132/78 (BP Location: Left Arm, Patient Position: Sitting, Cuff Size: Large)   Pulse 80   LMP 03/28/2016 (Exact Date)   SpO2 97%    Physical Exam HENT:     Head: Normocephalic.  Pulmonary:     Effort: Pulmonary effort is normal.  Skin:    Findings: Rash present.     Comments: Small maculopapular rash localized to dorsal forearm. Skin in non erythematous. No drainge noted. Skin is dry  Neurological:     Mental Status: She is alert.     No results found for any visits on 01/23/24.      Assessment & Plan:   Problem List Items Addressed This Visit   None Visit Diagnoses       Dermatitis    -  Primary     Recommended trying otc hydrocortisone cream at site. Avoid irritants or products with fragrances. Can try ointments such as Aquaphor to help moisturize site.  Consider prescription strength steroid cream if symptoms persists despite use of hydrocortisone.   No orders of the defined types were placed in this encounter.  As needed.  Kaimana Neuzil Flores Candis Kabel, NP

## 2024-02-11 ENCOUNTER — Encounter: Payer: Self-pay | Admitting: Nurse Practitioner

## 2024-02-11 ENCOUNTER — Ambulatory Visit: Payer: Self-pay | Admitting: Nurse Practitioner

## 2024-02-11 VITALS — BP 150/86 | HR 98 | Temp 97.1°F

## 2024-02-11 DIAGNOSIS — Z23 Encounter for immunization: Secondary | ICD-10-CM

## 2024-02-11 DIAGNOSIS — Z789 Other specified health status: Secondary | ICD-10-CM

## 2024-02-11 DIAGNOSIS — R030 Elevated blood-pressure reading, without diagnosis of hypertension: Secondary | ICD-10-CM

## 2024-02-11 NOTE — Progress Notes (Signed)
 Occupational Health- [facility]  Subjective:  Patient ID: Laurie Monroe, female    DOB: 08-19-1971  Age: 53 y.o. MRN: [id number]  CC: wellness exam   HPI Laurie Monroe presents for wellness exam visit for insurance benefit.  Patient has a PCP: Dr. Crecencio Dodge PMH significant for: seasonal allergies, bronchitis.  Last labs per PCP were completed: Dec 2024  Health Maintenance:  Colonoscopy: August 2021 Mammogram: July 2024 Pap: July 2024    Smoker: never  Immunizations:  Shingrix-  never done, interested in this Flu- did not get it this last season but does get it yearly Tdap: due 2026   Lifestyle: Diet- no particular diet  Exercise- walks a lot while working    Endorses taking prednisone  currently for ankle pain. Has seen ortho for this issue. Is not taking meloxicam  but uses ibuprofen  as needed.   Past Medical History:  Diagnosis Date   Allergy    Anemia    Chicken pox    Epilepsy (HCC)    Hx of Epilepsy. 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   Tricuspid regurgitation     Past Surgical History:  Procedure Laterality Date   ABDOMINAL HYSTERECTOMY N/A 03/28/2016   Procedure: HYSTERECTOMY ABDOMINAL;  Surgeon: Cyd Dowse, MD;  Location: [facility];  Service: Gynecology;  Laterality: N/A;   BILATERAL SALPINGECTOMY Bilateral 03/28/2016   Procedure: BILATERAL SALPINGECTOMY;  Surgeon: Cyd Dowse, MD;  Location: [facility];  Service: Gynecology;  Laterality: Bilateral;   DILATION AND CURETTAGE OF UTERUS     Heavy Periods   VARICOSE VEIN SURGERY      Outpatient Medications Prior to Visit  Medication Sig Dispense Refill   albuterol  (VENTOLIN  HFA) 108 (90 Base) MCG/ACT inhaler INHALE 2 PUFFS EVERY 6 HOURS AS NEEDED FOR WHEEZING OR SHORTNESS OF BREATH 8.5 g 1   cetirizine  (ZYRTEC ) 10 MG tablet Take 1 tablet (10 mg total) by mouth  daily. 90 tablet 1   furosemide  (LASIX ) 40 MG tablet TAKE 1 TABLET(40 MG) BY MOUTH DAILY 90 tablet 1   meloxicam  (MOBIC ) 15 MG tablet 1/2 -1 po every day (Patient not taking: Reported on 02/11/2024) 30 tablet 2   mometasone  (NASONEX ) 50 MCG/ACT nasal spray Place 2 sprays into the nose at bedtime. As needed. 17 g 3   montelukast  (SINGULAIR ) 10 MG tablet TAKE 1 TABLET(10 MG) BY MOUTH AT BEDTIME 90 tablet 1   MULTIPLE VITAMIN PO Take 1 tablet by mouth daily.      Vitamin D , Ergocalciferol , (DRISDOL ) 1.25 MG (50000 UNIT) CAPS capsule Take 1 capsule (50,000 Units total) by mouth every 7 (seven) days. 12 capsule 1   methocarbamol  (ROBAXIN ) 500 MG tablet Take 1 tablet (500 mg total) by mouth 4 (four) times daily. (Patient not taking: Reported on 02/11/2024) 45 tablet 1   tirzepatide  (ZEPBOUND ) 2.5 MG/0.5ML Pen Inject 2.5 mg into the skin once a week. (Patient not taking: Reported on 01/23/2024) 0.5 mL 0   tirzepatide  (ZEPBOUND ) 2.5 MG/0.5ML Pen Inject 2.5 mg into the skin once a week. (Patient not taking: Reported on 02/11/2024) 0.5 mL 0   No facility-administered medications prior to visit.    ROS Review of Systems  HENT:  Negative for hearing loss.   Eyes:  Negative for visual disturbance.  Respiratory:  Negative for cough and shortness of breath.   Cardiovascular:  Negative for chest pain.  Gastrointestinal:  Negative for constipation, diarrhea, nausea and vomiting.  Neurological:  Negative for dizziness and headaches.    Objective:  BP (!) 150/86 (BP Location: Left Arm, Patient Position: Sitting, Cuff Size: Normal)   Pulse 98   Temp (!) 97.1 F (36.2 C)   LMP 03/28/2016 (Exact Date)   SpO2 97%   Physical Exam Constitutional:      General: She is not in acute distress. HENT:     Head: Normocephalic.     Right Ear: Tympanic membrane, ear canal and external ear normal.     Left Ear: Tympanic membrane, ear canal and external ear normal.     Mouth/Throat:     Mouth: Mucous membranes are  moist.     Pharynx: No oropharyngeal exudate or posterior oropharyngeal erythema.  Eyes:     Pupils: Pupils are equal, round, and reactive to light.  Cardiovascular:     Rate and Rhythm: Normal rate and regular rhythm.     Heart sounds: Normal heart sounds.  Pulmonary:     Effort: Pulmonary effort is normal. No respiratory distress.     Breath sounds: Normal breath sounds. No wheezing.  Musculoskeletal:        General: Normal range of motion.     Cervical back: Normal range of motion.     Comments: 1+ swelling of left ankle noted  Skin:    General: Skin is warm.  Neurological:     General: No focal deficit present.     Mental Status: She is alert and oriented to person, place, and time.  Psychiatric:        Mood and Affect: Mood normal.        Behavior: Behavior normal.      Assessment & Plan:    Breunna was seen today for wellness exam.  Diagnoses and all orders for this visit:  Participant in health and wellness plan Adult wellness physical was conducted today. Importance of diet and exercise were discussed in detail.  We reviewed immunizations and gave recommendations regarding current immunization needed for age.  Preventative health exams needed: mammogram due in July.   Patient was advised yearly wellness exam and follow-up with PCP as scheduled.   Immunization due Scheduled for shingles vaccine next week.   Elevated blood pressure reading BP is elevated today. Likely secondary to prednisone  or NSAIDs. Instructed patient continue to monitor BP at home over the next 1-2 weeks and follow-up if BP remains elevated for further management.    No orders of the defined types were placed in this encounter.   No orders of the defined types were placed in this encounter.   Follow-up: in 1 week for Shingrix.

## 2024-02-18 ENCOUNTER — Ambulatory Visit: Payer: Self-pay | Admitting: Nurse Practitioner

## 2024-02-18 VITALS — BP 150/100

## 2024-02-18 NOTE — Progress Notes (Signed)
 1st Dose of Shingrix given today. CDC VIS given prior to injection.  Tolerated well. No immediate adverse reactions noted. Educated and discussed red flag symptoms and to call 911 or go to emergency room with signs of anaphylaxis.  Take OTC tylenol  as needed for expected side effects such as muscle aches or fever.  Patient given the chance to ask all questions and discussed answers.  RTC in 2 months for 2nd dose of shingrix vaccine to complete series, or sooner as needed.   Reports blood pressure is still elevated at home SBP 150s. She has been checking with a wrist cuff. She used a regular cuff one time and states reading was normal. Not limiting sodium in diet. I recommended obtaining an automatic arm BP cuff for accurate readings. BP log x 1 week and follow-up with PCP for further management. If unable to get into PCP office she can return to clinic in 2 weeks.

## 2024-04-27 ENCOUNTER — Other Ambulatory Visit: Payer: Self-pay | Admitting: Family Medicine

## 2024-04-27 DIAGNOSIS — J302 Other seasonal allergic rhinitis: Secondary | ICD-10-CM

## 2024-05-01 ENCOUNTER — Other Ambulatory Visit: Payer: Self-pay | Admitting: Family Medicine

## 2024-05-01 DIAGNOSIS — J302 Other seasonal allergic rhinitis: Secondary | ICD-10-CM

## 2024-05-01 NOTE — Telephone Encounter (Signed)
 Copied from CRM [id number]. Topic: Clinical - Medication Refill >> May 01, 2024  4:24 PM Burnard DEL wrote: Medication: montelukast  (SINGULAIR ) 10 MG tablet  Has the patient contacted their pharmacy? Yes (Agent: If no, request that the patient contact the pharmacy for the refill. If patient does not wish to contact the pharmacy document the reason why and proceed with request.) (Agent: If yes, when and what did the pharmacy advise?)  This is the patient's preferred pharmacy:  [facility] STORE #[id number], [place] - [address] AT [facility] [address] [place] [address] Phone: [phone] Fax: [phone]  Is this the correct pharmacy for this prescription? Yes If no, delete pharmacy and type the correct one.   Has the prescription been filled recently? No  Is the patient out of the medication? Yes  Has the patient been seen for an appointment in the last year OR does the patient have an upcoming appointment? Yes  Can we respond through MyChart? Yes  Agent: Please be advised that Rx refills may take up to 3 business days. We ask that you follow-up with your pharmacy.

## 2024-05-05 ENCOUNTER — Encounter: Payer: Self-pay | Admitting: Family Medicine

## 2024-05-05 MED ORDER — MONTELUKAST SODIUM 10 MG PO TABS: 10.0000 mg | ORAL_TABLET | Freq: Every day | ORAL | 1 refills | Status: AC

## 2024-05-11 ENCOUNTER — Encounter: Payer: Self-pay | Admitting: Family

## 2024-05-15 ENCOUNTER — Ambulatory Visit (INDEPENDENT_AMBULATORY_CARE_PROVIDER_SITE_OTHER): Payer: PRIVATE HEALTH INSURANCE | Admitting: Family Medicine

## 2024-05-15 ENCOUNTER — Encounter: Payer: Self-pay | Admitting: Family

## 2024-05-15 ENCOUNTER — Encounter: Payer: Self-pay | Admitting: Family Medicine

## 2024-05-15 VITALS — BP 124/84 | HR 95 | Temp 98.9°F | Resp 18 | Ht 66.0 in | Wt 270.8 lb

## 2024-05-15 DIAGNOSIS — R202 Paresthesia of skin: Secondary | ICD-10-CM

## 2024-05-15 DIAGNOSIS — G8929 Other chronic pain: Secondary | ICD-10-CM

## 2024-05-15 DIAGNOSIS — M25561 Pain in right knee: Secondary | ICD-10-CM

## 2024-05-15 DIAGNOSIS — E559 Vitamin D deficiency, unspecified: Secondary | ICD-10-CM

## 2024-05-15 DIAGNOSIS — R200 Anesthesia of skin: Secondary | ICD-10-CM

## 2024-05-15 DIAGNOSIS — M25572 Pain in left ankle and joints of left foot: Secondary | ICD-10-CM | POA: Diagnosis not present

## 2024-05-15 DIAGNOSIS — D509 Iron deficiency anemia, unspecified: Secondary | ICD-10-CM | POA: Diagnosis not present

## 2024-05-15 DIAGNOSIS — Z6841 Body Mass Index (BMI) 40.0 and over, adult: Secondary | ICD-10-CM | POA: Diagnosis not present

## 2024-05-15 DIAGNOSIS — J4521 Mild intermittent asthma with (acute) exacerbation: Secondary | ICD-10-CM

## 2024-05-15 DIAGNOSIS — R600 Localized edema: Secondary | ICD-10-CM

## 2024-05-15 DIAGNOSIS — Z0000 Encounter for general adult medical examination without abnormal findings: Secondary | ICD-10-CM | POA: Diagnosis not present

## 2024-05-15 DIAGNOSIS — K580 Irritable bowel syndrome with diarrhea: Secondary | ICD-10-CM

## 2024-05-15 DIAGNOSIS — I10 Essential (primary) hypertension: Secondary | ICD-10-CM

## 2024-05-15 MED ORDER — DICYCLOMINE HCL 10 MG PO CAPS: 10.0000 mg | ORAL_CAPSULE | Freq: Three times a day (TID) | ORAL | 1 refills | Status: AC

## 2024-05-15 MED ORDER — PREDNISONE 10 MG PO TABS: ORAL_TABLET | ORAL | 0 refills | Status: AC

## 2024-05-15 NOTE — Assessment & Plan Note (Signed)
 Well controlled, no changes to meds. Encouraged heart healthy diet such as the DASH diet and exercise as tolerated.

## 2024-05-15 NOTE — Progress Notes (Signed)
 Subjective:    Patient ID: Laurie Monroe, female    DOB: 1970/12/01, 53 y.o.   MRN: [id number]  Chief Complaint  Patient presents with   Annual Exam    Pt states not fasting     HPI Patient is in today for cpe and has multiple complaints.  Discussed the use of AI scribe software for clinical note transcription with the patient, who gave verbal consent to proceed.  History of Present Illness Laurie Monroe is a 53 year old female who presents with joint pain.  She experiences persistent pain in her right knee and left ankle, ongoing for months. She recalls being told that previous x-rays showed arthritis. She has been using [employer] and inserts, but the inserts cause discomfort. Ibuprofen  has been used for months to manage the pain, but it is not effective. Meloxicam  provided minimal relief. Pain increases after sitting for long periods, necessitating holding onto objects to move, especially when getting up.  She recalls being told she had a tendon tear in her left ankle based on an MRI, and that it was expected to heal on its own, but the pain persists. She recalls being informed about a bone spur in the past.  Intermittent numbness in her left hand occurs, which she alleviates by shaking. This symptom is sporadic and has not been evaluated by a specialist.  She has a history of elevated blood pressure, which she associates with prednisone  use. She had two prescriptions for prednisone , one of which was used to manage ankle pain, resulting in a temporary increase in blood pressure. After discontinuing prednisone , her blood pressure decreased but remains slightly elevated. Recent readings were 147/94 and 142/85.  Gastrointestinal symptoms include urgency to use the bathroom after consuming certain foods, particularly fried and dairy products. She has a history of C. difficile infection. She has had a colonoscopy in the past.  She takes Tums alongside ibuprofen  to protect her stomach,  although she has not experienced stomach problems.  No changes in family history, recent surgeries, or new medical conditions. She is not currently sexually active and lives with her daughter. She works at a facility called [employer].  No recent illness, breathing and asthma are stable, and no medication refills are needed. She has received her flu and shingles vaccines at work.    Past Medical History:  Diagnosis Date   Allergy    Anemia    Chicken pox    Epilepsy (HCC)    Hx of Epilepsy. 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   Tricuspid regurgitation     Past Surgical History:  Procedure Laterality Date   ABDOMINAL HYSTERECTOMY N/A 03/28/2016   Procedure: HYSTERECTOMY ABDOMINAL;  Surgeon: Krystal Deaner, MD;  Location: [facility];  Service: Gynecology;  Laterality: N/A;   BILATERAL SALPINGECTOMY Bilateral 03/28/2016   Procedure: BILATERAL SALPINGECTOMY;  Surgeon: Krystal Deaner, MD;  Location: [facility];  Service: Gynecology;  Laterality: Bilateral;   DILATION AND CURETTAGE OF UTERUS     Heavy Periods   VARICOSE VEIN SURGERY      Family History  Problem Relation Age of Onset   Hypertension Mother    Brain cancer Mother    Prostate cancer Father    Hypertension Father    Liver cancer Father    Stomach cancer Father  Heart disease Father    Hypertension Brother        Twin Brother   Heart disease Brother 7       coronary stent    Social History   Socioeconomic History   Marital status: Single    Spouse name: Not on file   Number of children: Not on file   Years of education: Not on file   Highest education level: Not on file  Occupational History   Occupation: CNA    Employer: [employer]  Tobacco Use   Smoking status: Never   Smokeless tobacco: Never  Substance and Sexual Activity   Alcohol use: No     Alcohol/week: 0.0 standard drinks of alcohol   Drug use: No   Sexual activity: Not Currently    Partners: Male    Birth control/protection: None  Other Topics Concern   Not on file  Social History Narrative   Exercise ---- no except walking at work    Lives with her daughter    Social Drivers of Corporate investment banker Strain: Not on file  Food Insecurity: Not on file  Transportation Needs: Not on file  Physical Activity: Not on file  Stress: Not on file  Social Connections: Not on file  Intimate Partner Violence: Not on file    Outpatient Medications Prior to Visit  Medication Sig Dispense Refill   albuterol  (VENTOLIN  HFA) 108 (90 Base) MCG/ACT inhaler INHALE 2 PUFFS EVERY 6 HOURS AS NEEDED FOR WHEEZING OR SHORTNESS OF BREATH 8.5 g 1   cetirizine  (ZYRTEC ) 10 MG tablet TAKE 1 TABLET(10 MG) BY MOUTH DAILY 90 tablet 1   furosemide  (LASIX ) 40 MG tablet TAKE 1 TABLET(40 MG) BY MOUTH DAILY 90 tablet 1   mometasone  (NASONEX ) 50 MCG/ACT nasal spray Place 2 sprays into the nose at bedtime. As needed. 17 g 3   montelukast  (SINGULAIR ) 10 MG tablet Take 1 tablet (10 mg total) by mouth at bedtime. 90 tablet 1   MULTIPLE VITAMIN PO Take 1 tablet by mouth daily.      Vitamin D , Ergocalciferol , (DRISDOL ) 1.25 MG (50000 UNIT) CAPS capsule Take 1 capsule (50,000 Units total) by mouth every 7 (seven) days. 12 capsule 1   meloxicam  (MOBIC ) 15 MG tablet 1/2 -1 po every day (Patient not taking: Reported on 05/15/2024) 30 tablet 2   No facility-administered medications prior to visit.    No Known Allergies  Review of Systems  Constitutional:  Negative for fever and malaise/fatigue.  HENT:  Negative for congestion.   Eyes:  Negative for blurred vision.  Respiratory:  Negative for cough and shortness of breath.   Cardiovascular:  Negative for chest pain, palpitations and leg swelling.  Gastrointestinal:  Positive for abdominal pain and diarrhea. Negative for blood in stool, constipation,  melena and vomiting.  Musculoskeletal:  Positive for joint pain and myalgias. Negative for back pain.  Skin:  Negative for rash.  Neurological:  Negative for loss of consciousness and headaches.       Objective:    Physical Exam Vitals and nursing note reviewed.  Constitutional:      General: She is not in acute distress.    Appearance: Normal appearance. She is well-developed.  HENT:     Head: Normocephalic and atraumatic.     Right Ear: Tympanic membrane, ear canal and external ear normal. There is no impacted cerumen.     Left Ear: Tympanic membrane, ear canal and external ear normal. There is no impacted cerumen.  Nose: Nose normal.     Mouth/Throat:     Mouth: Mucous membranes are moist.     Pharynx: Oropharynx is clear. No oropharyngeal exudate or posterior oropharyngeal erythema.  Eyes:     General: No scleral icterus.       Right eye: No discharge.        Left eye: No discharge.     Conjunctiva/sclera: Conjunctivae normal.     Pupils: Pupils are equal, round, and reactive to light.  Neck:     Thyroid: No thyromegaly or thyroid tenderness.     Vascular: No JVD.  Cardiovascular:     Rate and Rhythm: Normal rate and regular rhythm.     Heart sounds: Normal heart sounds. No murmur heard. Pulmonary:     Effort: Pulmonary effort is normal. No respiratory distress.     Breath sounds: Normal breath sounds.  Abdominal:     General: Bowel sounds are normal. There is no distension.     Palpations: Abdomen is soft. There is no mass.     Tenderness: There is no abdominal tenderness. There is no guarding or rebound.  Genitourinary:    Vagina: Normal.  Musculoskeletal:        General: Tenderness present.     Right hand: Normal.     Left hand: Decreased sensation.     Cervical back: Normal range of motion and neck supple.     Right knee: Decreased range of motion. Tenderness present.     Right lower leg: No edema.     Left lower leg: No edema.     Right ankle: Normal.      Left ankle: Swelling present. Tenderness present. Decreased range of motion.  Lymphadenopathy:     Cervical: No cervical adenopathy.  Skin:    General: Skin is warm and dry.     Findings: No erythema or rash.  Neurological:     Mental Status: She is alert and oriented to person, place, and time.     Cranial Nerves: No cranial nerve deficit.     Deep Tendon Reflexes: Reflexes are normal and symmetric.  Psychiatric:        Mood and Affect: Mood normal.        Behavior: Behavior normal.        Thought Content: Thought content normal.        Judgment: Judgment normal.     BP 124/84 (BP Location: Left Arm, Patient Position: Sitting, Cuff Size: Large)   Pulse 95   Temp 98.9 F (37.2 C) (Oral)   Resp 18   Ht 5' 6 (1.676 m)   Wt 270 lb 12.8 oz (122.8 kg)   LMP 03/28/2016 (Exact Date)   SpO2 96%   BMI 43.71 kg/m  Wt Readings from Last 3 Encounters:  05/15/24 270 lb 12.8 oz (122.8 kg)  10/18/23 269 lb 3.2 oz (122.1 kg)  09/10/23 257 lb (116.6 kg)    Diabetic Foot Exam - Simple   No data filed    Lab Results  Component Value Date   WBC 10.5 05/14/2023   HGB 12.0 05/14/2023   HCT 38.8 05/14/2023   PLT 325.0 05/14/2023   GLUCOSE 68 (L) 05/14/2023   CHOL 141 05/14/2023   TRIG 60.0 05/14/2023   HDL 57.60 05/14/2023   LDLCALC 71 05/14/2023   ALT 15 05/14/2023   AST 17 05/14/2023   NA 141 05/14/2023   K 4.0 05/14/2023   CL 104 05/14/2023   CREATININE 0.78 05/14/2023  BUN 12 05/14/2023   CO2 28 05/14/2023   TSH 1.08 05/14/2023   HGBA1C 5.9 05/14/2023    Lab Results  Component Value Date   TSH 1.08 05/14/2023   Lab Results  Component Value Date   WBC 10.5 05/14/2023   HGB 12.0 05/14/2023   HCT 38.8 05/14/2023   MCV 66.4 Repeated and verified X2. (L) 05/14/2023   PLT 325.0 05/14/2023   Lab Results  Component Value Date   NA 141 05/14/2023   K 4.0 05/14/2023   CO2 28 05/14/2023   GLUCOSE 68 (L) 05/14/2023   BUN 12 05/14/2023   CREATININE 0.78 05/14/2023    BILITOT 0.6 05/14/2023   ALKPHOS 76 05/14/2023   AST 17 05/14/2023   ALT 15 05/14/2023   PROT 6.8 05/14/2023   ALBUMIN 4.0 05/14/2023   CALCIUM 9.4 05/14/2023   ANIONGAP 7 03/16/2016   GFR 88.72 05/14/2023   Lab Results  Component Value Date   CHOL 141 05/14/2023   Lab Results  Component Value Date   HDL 57.60 05/14/2023   Lab Results  Component Value Date   LDLCALC 71 05/14/2023   Lab Results  Component Value Date   TRIG 60.0 05/14/2023   Lab Results  Component Value Date   CHOLHDL 2 05/14/2023   Lab Results  Component Value Date   HGBA1C 5.9 05/14/2023       Assessment & Plan:  Preventative health care Assessment & Plan: Ghm utd Check labs  See AVS Health Maintenance  Topic Date Due   HIV Screening  Never done   Pneumococcal Vaccine: 50+ Years (1 of 2 - PCV) Never done   Hepatitis B Vaccines 19-59 Average Risk (1 of 3 - 19+ 3-dose series) Never done   Influenza Vaccine  04/03/2024   Zoster Vaccines- Shingrix (2 of 2) 04/14/2024   COVID-19 Vaccine (4 - 2025-26 season) 05/04/2024   Mammogram  03/17/2025   DTaP/Tdap/Td (2 - Td or Tdap) 07/02/2025   Colonoscopy  04/14/2030   Hepatitis C Screening  Completed   HPV VACCINES  Aged Out   Meningococcal B Vaccine  Aged Out     Orders: -     CBC with Differential/Platelet -     Comprehensive metabolic panel with GFR -     Lipid panel -     TSH  Chronic pain of right knee Assessment & Plan: Hx OA Refer to sport med  Orders: -     predniSONE ; TAKE 3 TABLETS PO QD FOR 3 DAYS THEN TAKE 2 TABLETS PO QD FOR 3 DAYS THEN TAKE 1 TABLET PO QD FOR 3 DAYS THEN TAKE 1/2 TAB PO QD FOR 3 DAYS  Dispense: 20 tablet; Refill: 0  Chronic pain of left ankle Assessment & Plan: Hx OA Refer to sport med  Orders: -     Ambulatory referral to Sports Medicine -     predniSONE ; TAKE 3 TABLETS PO QD FOR 3 DAYS THEN TAKE 2 TABLETS PO QD FOR 3 DAYS THEN TAKE 1 TABLET PO QD FOR 3 DAYS THEN TAKE 1/2 TAB PO QD FOR 3 DAYS   Dispense: 20 tablet; Refill: 0  Numbness and tingling in left hand Assessment & Plan: Prob CTS Refer sport med  Orders: -     Ambulatory referral to Sports Medicine  Primary hypertension Assessment & Plan: Well controlled, no changes to meds. Encouraged heart healthy diet such as the DASH diet and exercise as tolerated.    Orders: -  CBC with Differential/Platelet -     Comprehensive metabolic panel with GFR -     Lipid panel  Morbid obesity (HCC) Assessment & Plan: Con't walking--- as joint pains allows  Work on die   Mild intermittent asthma with acute exacerbation  Vitamin D  deficiency Assessment & Plan: Check labs  Orders: -     VITAMIN D  25 Hydroxy (Vit-D Deficiency, Fractures)  Bilateral edema of lower extremity  Iron deficiency anemia, unspecified iron deficiency anemia type Assessment & Plan: Check labs   Orders: -     CBC with Differential/Platelet  Irritable bowel syndrome with diarrhea -     Ambulatory referral to Gastroenterology -     Dicyclomine  HCl; Take 1 capsule (10 mg total) by mouth 4 (four) times daily -  before meals and at bedtime.  Dispense: 45 capsule; Refill: 1   Assessment and Plan Assessment & Plan Right knee pain and left ankle pain, likely osteoarthritis and tendon injury   Chronic pain in the right knee and left ankle persists despite ibuprofen  and meloxicam , with previous imaging showing knee arthritis and an ankle tendon tear. Mobility is difficult, especially after sitting for long periods. Prednisone  was effective but caused hypertension. Steroid injections were discussed as a treatment option. She will be referred to sports medicine for further evaluation of the knee and ankle. A short burst of prednisone  may be considered for pain management, and the potential for steroid injections will be discussed.  Suspected left carpal tunnel syndrome   Intermittent numbness in the left hand suggests carpal tunnel syndrome. Symptoms are  bothersome but stable. The potential for steroid injections was discussed. A hand evaluation will be included in the sports medicine referral, and the possibility of steroid injections for carpal tunnel syndrome will be discussed.  Essential hypertension   Blood pressure readings are slightly elevated, possibly due to previous prednisone  use. The risks of long-term prednisone  use, including hypertension and risks to bones and kidneys, were discussed. Blood pressure will be monitored regularly, and the need for antihypertensive medication will be reassessed if blood pressure remains elevated.  Irritable bowel syndrome with diarrhea   Diarrhea occurs after consuming certain foods, particularly fried and dairy products. A previous C. diff infection was ruled out, and irritable bowel syndrome is suspected. Dietary modifications to avoid trigger foods were discussed. She will be referred to a gastroenterologist for further evaluation, prescribed medication for cramping, and advised on dietary changes.  Adult Wellness Visit   During the routine adult wellness visit, she is up to date with her Pap smear. General health maintenance, including vaccinations and screenings, was discussed. Vaccinations will be updated, including the flu shot and shingles vaccine, and eligibility for the pneumonia vaccine will be discussed.   Daren Doswell R Lowne Chase, DO

## 2024-05-15 NOTE — Assessment & Plan Note (Signed)
 Prob CTS Refer sport med

## 2024-05-15 NOTE — Assessment & Plan Note (Signed)
 Hx OA Refer to sport med

## 2024-05-15 NOTE — Assessment & Plan Note (Signed)
 Check labs

## 2024-05-15 NOTE — Assessment & Plan Note (Signed)
 Ghm utd Check labs  See AVS Health Maintenance  Topic Date Due   HIV Screening  Never done   Pneumococcal Vaccine: 50+ Years (1 of 2 - PCV) Never done   Hepatitis B Vaccines 19-59 Average Risk (1 of 3 - 19+ 3-dose series) Never done   Influenza Vaccine  04/03/2024   Zoster Vaccines- Shingrix (2 of 2) 04/14/2024   COVID-19 Vaccine (4 - 2025-26 season) 05/04/2024   Mammogram  03/17/2025   DTaP/Tdap/Td (2 - Td or Tdap) 07/02/2025   Colonoscopy  04/14/2030   Hepatitis C Screening  Completed   HPV VACCINES  Aged Out   Meningococcal B Vaccine  Aged Out

## 2024-05-15 NOTE — Assessment & Plan Note (Signed)
 Con't walking--- as joint pains allows  Work on die

## 2024-05-16 LAB — COMPREHENSIVE METABOLIC PANEL WITH GFR
AG Ratio: 1.6 (calc) (ref 1.0–2.5)
ALT: 16 U/L (ref 6–29)
AST: 15 U/L (ref 10–35)
Albumin: 4.1 g/dL (ref 3.6–5.1)
Alkaline phosphatase (APISO): 71 U/L (ref 37–153)
BUN: 15 mg/dL (ref 7–25)
CO2: 26 mmol/L (ref 20–32)
Calcium: 9.1 mg/dL (ref 8.6–10.4)
Chloride: 104 mmol/L (ref 98–110)
Creat: 0.76 mg/dL (ref 0.50–1.03)
Globulin: 2.5 g/dL (ref 1.9–3.7)
Glucose, Bld: 87 mg/dL (ref 65–99)
Potassium: 4 mmol/L (ref 3.5–5.3)
Sodium: 140 mmol/L (ref 135–146)
Total Bilirubin: 0.3 mg/dL (ref 0.2–1.2)
Total Protein: 6.6 g/dL (ref 6.1–8.1)
eGFR: 95 mL/min/1.73m2 (ref >=60)

## 2024-05-16 LAB — CBC WITH DIFFERENTIAL/PLATELET
Absolute Lymphocytes: 2846 {cells}/uL (ref 850–3900)
Absolute Monocytes: 835 {cells}/uL (ref 200–950)
Basophils Absolute: 64 {cells}/uL (ref 0–200)
Basophils Relative: 0.6 %
Eosinophils Absolute: 161 {cells}/uL (ref 15–500)
Eosinophils Relative: 1.5 %
HCT: 40.4 % (ref 35.0–45.0)
Hemoglobin: 12.1 g/dL (ref 11.7–15.5)
MCH: 20.9 pg — ABNORMAL LOW (ref 27.0–33.0)
MCHC: 30 g/dL — ABNORMAL LOW (ref 32.0–36.0)
MCV: 69.9 fL — ABNORMAL LOW (ref 80.0–100.0)
MPV: 11.3 fL (ref 7.5–12.5)
Monocytes Relative: 7.8 %
Neutro Abs: 6795 {cells}/uL (ref 1500–7800)
Neutrophils Relative %: 63.5 %
Platelets: 352 Thousand/uL (ref 140–400)
RBC: 5.78 Million/uL — ABNORMAL HIGH (ref 3.80–5.10)
RDW: 14.8 % (ref 11.0–15.0)
Total Lymphocyte: 26.6 %
WBC: 10.7 Thousand/uL (ref 3.8–10.8)

## 2024-05-16 LAB — LIPID PANEL
Cholesterol: 133 mg/dL (ref <200)
HDL: 52 mg/dL (ref >=50)
LDL Cholesterol (Calc): 66 mg/dL
Non-HDL Cholesterol (Calc): 81 mg/dL (ref <130)
Total CHOL/HDL Ratio: 2.6 (calc) (ref <5.0)
Triglycerides: 69 mg/dL (ref <150)

## 2024-05-16 LAB — VITAMIN D 25 HYDROXY (VIT D DEFICIENCY, FRACTURES): Vit D, 25-Hydroxy: 32 ng/mL (ref 30–100)

## 2024-05-16 LAB — TSH: TSH: 1.46 m[IU]/L

## 2024-05-19 ENCOUNTER — Encounter: Payer: Self-pay | Admitting: Nurse Practitioner

## 2024-05-19 ENCOUNTER — Ambulatory Visit: Payer: Self-pay | Admitting: Nurse Practitioner

## 2024-05-19 DIAGNOSIS — Z23 Encounter for immunization: Secondary | ICD-10-CM

## 2024-05-19 NOTE — Addendum Note (Signed)
 Addended by: Nakota Ackert F on: 05/19/2024 03:24 PM   Modules accepted: Orders, Level of Service

## 2024-05-19 NOTE — Progress Notes (Signed)
2nd Dose of Shingrix given today. CDC VIS given prior to injection.  Tolerated well. No immediate adverse reactions noted. Educated and discussed red flag symptoms and to call 911 or go to emergency room with signs of anaphylaxis.  Take OTC tylenol as needed for expected side effects such as muscle aches or fever.  Patient given the chance to ask all questions and discussed answers.  RTC as needed.

## 2024-05-20 ENCOUNTER — Ambulatory Visit: Payer: Self-pay

## 2024-05-20 NOTE — Telephone Encounter (Signed)
 FYI Only or Action Required?: FYI only for provider.  Patient was last seen in primary care on 05/15/2024 by [name], [name], DO.  Called Nurse Triage reporting Sore Throat.  Symptoms began today.  Interventions attempted: Nothing.  Symptoms are: unchanged.  Triage Disposition: Home Care  Patient/caregiver understands and will follow disposition?: Yes  Copied from CRM [id number]. Topic: Clinical - Red Word Triage >> May 20, 2024  3:40 PM Laurie Monroe wrote: Red Word that prompted transfer to Nurse Triage: Patient is calling in stating that she got her second dose of the shingles shot yesterday, and now it is hard to swallow and her throat is feeling scratchy. Reason for Disposition  [1] Sore throat is the only symptom AND [2] sore throat present < 48 hours  Answer Assessment - Initial Assessment Questions Patient received second dose of shingles vaccine one day ago  1. ONSET: When did the throat start hurting? (Hours or days ago)      today 2. SEVERITY: How bad is the sore throat? (Scale 1-10; mild, moderate or severe)    Bearable 3. STREP EXPOSURE: Has there been any exposure to strep within the past week? If Yes, ask: What type of contact occurred?      Denies, not that patient is aware 4.  VIRAL SYMPTOMS: Are there any symptoms of a cold, such as a runny nose, cough, hoarse voice or red eyes?      denies 5. FEVER: Do you have a fever? If Yes, ask: What is your temperature, how was it measured, and when did it start?     denies 6. PUS ON THE TONSILS: Is there pus on the tonsils in the back of your throat?     Denies 7. OTHER SYMPTOMS: Do you have any other symptoms? (e.g., difficulty breathing, headache, rash)     denies 8. PREGNANCY: Is there any chance you are pregnant? When was your last menstrual period?     N/a  Protocols used: Sore Throat-A-AH

## 2024-05-22 ENCOUNTER — Ambulatory Visit: Payer: Self-pay | Admitting: Family Medicine

## 2024-05-25 ENCOUNTER — Ambulatory Visit: Payer: Self-pay

## 2024-05-25 NOTE — Telephone Encounter (Signed)
 FYI Only or Action Required?: FYI only for provider.  Patient was last seen in primary care on 05/15/2024 by Laurie Monroe, Laurie SAUNDERS, DO.  Called Nurse Triage reporting Sore Throat.  Symptoms began a week ago.  Interventions attempted: OTC medications: tylenol  and Rest, hydration, or home remedies.  Symptoms are: unchanged.  Triage Disposition: See PCP When Office is Open (Within 3 Days)  Patient/caregiver understands and will follow disposition?: Yes  Copied from CRM #[id number]. Topic: Clinical - Medical Advice >> May 25, 2024  4:17 PM Laurie Monroe wrote: Reason for CRM: Patient is still having pain in throat after shingles shot.Laurie Monroe number [phone] Reason for Disposition  [1] Sore throat is the only symptom AND [2] present > 48 hours  Answer Assessment - Initial Assessment Questions Patient taking cough drops, dayquil, and tylenol , but pain comes back when medications wear off  1. ONSET: When did the throat start hurting? (Hours or days ago)      Sore throat after shingles shot last week 2. SEVERITY: How bad is the sore throat? (Scale 1-10; mild, moderate or severe)     Bearable, just annoying 5/10 Hurts to swallow 3. STREP EXPOSURE: Has there been any exposure to strep within the past week? If Yes, ask: What type of contact occurred?      denies 4.  VIRAL SYMPTOMS: Are there any symptoms of a cold, such as a runny nose, cough, hoarse voice or red eyes?      denies 5. FEVER: Do you have a fever? If Yes, ask: What is your temperature, how was it measured, and when did it start?     denies 6. PUS ON THE TONSILS: Is there pus on the tonsils in the back of your throat?     N/a 7. OTHER SYMPTOMS: Do you have any other symptoms? (e.Monroe., difficulty breathing, headache, rash)     denies 8. PREGNANCY: Is there any chance you are pregnant? When was your last menstrual period?     N/a  Protocols used: Sore Throat-A-AH

## 2024-05-26 ENCOUNTER — Ambulatory Visit (INDEPENDENT_AMBULATORY_CARE_PROVIDER_SITE_OTHER): Payer: PRIVATE HEALTH INSURANCE | Admitting: Family Medicine

## 2024-05-26 ENCOUNTER — Encounter: Payer: Self-pay | Admitting: Family Medicine

## 2024-05-26 VITALS — BP 128/86 | HR 95 | Temp 99.0°F | Resp 12 | Ht 66.0 in | Wt 270.6 lb

## 2024-05-26 DIAGNOSIS — J029 Acute pharyngitis, unspecified: Secondary | ICD-10-CM

## 2024-05-26 LAB — POCT RAPID STREP A (OFFICE): Rapid Strep A Screen: NEGATIVE

## 2024-05-26 LAB — POCT INFLUENZA A/B
Influenza A, POC: NEGATIVE
Influenza B, POC: NEGATIVE

## 2024-05-26 LAB — POC COVID19 BINAXNOW: SARS Coronavirus 2 Ag: NEGATIVE

## 2024-05-26 MED ORDER — AMOXICILLIN 875 MG PO TABS: 875.0000 mg | ORAL_TABLET | Freq: Two times a day (BID) | ORAL | 0 refills | Status: AC

## 2024-05-26 NOTE — Telephone Encounter (Signed)
 Appt scheduled

## 2024-05-26 NOTE — Progress Notes (Unsigned)
 Laurie Monroe D.Laurie Monroe [facility] [address] [place] [address] Phone: [phone]   Assessment and Plan:     1. Chronic pain of left ankle (Primary) -Chronic with exacerbation, initial visit - Patient complaining of ongoing left ankle pain present for 1+ year.  Most consistent with ankle sprain with residual weakness, instability, flares of pain - MRI 08/12/2024 shows impression: 1.  Split tear with moderate tendinosis and tenosynovitis of tibialis posterior. 2.  Mild tenosynovitis of peroneus longus and flexor digitorum longus. 3.  Attenuated ATFL, likely related to remote injury. 4.  Findings suggesting mild chronic plantar fasciitis.   - Start HEP and physical therapy for ankle - Start Celebrex  200 mg twice daily x2 weeks.  If still having pain after 2 weeks, complete 3rd-week of NSAID. May use remaining NSAID as needed once daily for pain control.  Do not to use additional over-the-counter NSAIDs (ibuprofen , naproxen, Advil , Aleve, etc.) while taking prescription NSAIDs.  May use Tylenol  [phone] mg 2 to 3 times a day for breakthrough pain.  Patient has used meloxicam  in the past with moderate relief initially but eventually became ineffective  2. Left carpal tunnel syndrome 3. Left hand pain -Chronic with exacerbation, initial visit - Intermittent numbness and tingling in the left hand most consistent with intermittent carpal tunnel syndrome - Start HEP for carpal tunnel - Start Celebrex  200 mg twice daily x2 weeks.  If still having pain after 2 weeks, complete 3rd-week of NSAID. May use remaining NSAID as needed once daily for pain control.  Do not to use additional over-the-counter NSAIDs (ibuprofen , naproxen, Advil , Aleve, etc.) while taking prescription NSAIDs.  May use Tylenol  [phone] mg 2 to 3 times a day for breakthrough pain.  Patient has used meloxicam  in the past with moderate relief initially but eventually became ineffective  4.  Chronic pain of right knee 5. Primary osteoarthritis of right knee -Chronic with exacerbation, initial visit - Most consistent with osteoarthritis of right knee - Start HEP and physical therapy - Start Celebrex  200 mg twice daily x2 weeks.  If still having pain after 2 weeks, complete 3rd-week of NSAID. May use remaining NSAID as needed once daily for pain control.  Do not to use additional over-the-counter NSAIDs (ibuprofen , naproxen, Advil , Aleve, etc.) while taking prescription NSAIDs.  May use Tylenol  [phone] mg 2 to 3 times a day for breakthrough pain.  Patient has used meloxicam  in the past with moderate relief initially but eventually became ineffective  15 additional minutes spent for educating Therapeutic Home Exercise Program.  This included exercises focusing on stretching, strengthening, with focus on eccentric aspects.   Long term goals include an improvement in range of motion, strength, endurance as well as avoiding reinjury. Patient's frequency would include in 1-2 times a day, 3-5 times a week for a duration of 6-12 weeks. Proper technique shown and discussed handout in great detail with ATC.  All questions were discussed and answered.     Pertinent previous records reviewed include right knee x-ray 11/01/2023   Follow Up: 4 weeks for reevaluation.  Could consider knee CSI versus carpal tunnel CSI.  Would review ankle MRI report   Subjective:   I, Laurie Monroe, am serving as a Neurosurgeon for Laurie Monroe  Chief Complaint: left ankle and left hand pain   HPI:   05/27/2024 Patient is a 53 year old female with left ankle and hand pain. Patient states left ankle pain started last year. Hx of  torn tendon in left ankle. No surgery recommended. She still has pain. Tylenol  for the pain and that helps but not enough. Total ankle pain. Antalgic gait. No radiating pain.   Left hand pain intermittent numbness and tingling total hand. This sensation only last a couple of seconds.  Grip strength intact. ROM WNL    Right knee pain for a while. Feels like its going to give out. Dx of arthritis when she saw PCP. No radiating pain. Pain located to patellar tendon  Relevant Historical Information: Pulmonary hypertension, GERD, elevated BMI  Additional pertinent review of systems negative.   Current Outpatient Medications:    celecoxib  (CELEBREX ) 200 MG capsule, Take 1 capsule (200 mg total) by mouth 2 (two) times daily., Disp: 60 capsule, Rfl: 0   albuterol  (VENTOLIN  HFA) 108 (90 Base) MCG/ACT inhaler, INHALE 2 PUFFS EVERY 6 HOURS AS NEEDED FOR WHEEZING OR SHORTNESS OF BREATH, Disp: 8.5 g, Rfl: 1   amoxicillin  (AMOXIL ) 875 MG tablet, Take 1 tablet (875 mg total) by mouth 2 (two) times daily for 10 days., Disp: 20 tablet, Rfl: 0   cetirizine  (ZYRTEC ) 10 MG tablet, TAKE 1 TABLET(10 MG) BY MOUTH DAILY, Disp: 90 tablet, Rfl: 1   dicyclomine  (BENTYL ) 10 MG capsule, Take 1 capsule (10 mg total) by mouth 4 (four) times daily -  before meals and at bedtime., Disp: 45 capsule, Rfl: 1   furosemide  (LASIX ) 40 MG tablet, TAKE 1 TABLET(40 MG) BY MOUTH DAILY, Disp: 90 tablet, Rfl: 1   mometasone  (NASONEX ) 50 MCG/ACT nasal spray, Place 2 sprays into the nose at bedtime. As needed., Disp: 17 g, Rfl: 3   montelukast  (SINGULAIR ) 10 MG tablet, Take 1 tablet (10 mg total) by mouth at bedtime., Disp: 90 tablet, Rfl: 1   MULTIPLE VITAMIN PO, Take 1 tablet by mouth daily. , Disp: , Rfl:    predniSONE  (DELTASONE ) 10 MG tablet, TAKE 3 TABLETS PO QD FOR 3 DAYS THEN TAKE 2 TABLETS PO QD FOR 3 DAYS THEN TAKE 1 TABLET PO QD FOR 3 DAYS THEN TAKE 1/2 TAB PO QD FOR 3 DAYS, Disp: 20 tablet, Rfl: 0   Vitamin D , Ergocalciferol , (DRISDOL ) 1.25 MG (50000 UNIT) CAPS capsule, Take 1 capsule (50,000 Units total) by mouth every 7 (seven) days., Disp: 12 capsule, Rfl: 1   Objective:     Vitals:   05/27/24 1532  BP: 138/70  Pulse: 96  SpO2: 98%  Weight: 269 lb (122 kg)  Height: 5' 6 (1.676 m)      Body  mass index is 43.42 kg/m.    Physical Exam:    General: Well-appearing, cooperative, sitting comfortably in no acute distress.  HEENT: Normocephalic, atraumatic.   Neck: No gross abnormality.  Cardiovascular: No pallor or cyanosis. Resp: Comfortable WOB.   Abdomen: Non distended.   Skin: Warm and dry; no focal rashes identified on limited exam. Extremities: No cyanosis or edema.  TTP left deltoid along medial ankle, left medial malleolus, right medial knee joint line Neuro: Gross motor and sensory intact. Gait normal. Psychiatric: Mood and affect are appropriate.    Electronically signed by:  Odis Monroe D.Laurie Monroe [facility] 4:19 PM 05/27/24

## 2024-05-26 NOTE — Progress Notes (Signed)
 Subjective:    Patient ID: Laurie Monroe, female    DOB: October 08, 1970, 53 y.o.   MRN: [id number]  Chief Complaint  Patient presents with   Sore Throat    HPI Patient is in today for sore throat.   Discussed the use of AI scribe software for clinical note transcription with the patient, who gave verbal consent to proceed.  History of Present Illness Laurie Monroe is a 53 year old female who presents with a sore throat following a shingles vaccination.  She developed a sore throat with odynophagia the day after receiving a shingles vaccination on Tuesday. The pain persisted over the weekend, prompting her to schedule an appointment. Today, she notes some improvement, but swallowing has been difficult in the previous days.  She denies fever or additional symptoms aside from a chronic cough, which her sister has commented on. She does not believe she coughs excessively. No new cough or illness in her daughter at home.  She has been using Tylenol  for pain relief, which provides temporary relief, and has been gargling with salt water. She is not taking any other medications except for Xiidra.  She recalls a previous experience with Clostridioides difficile infection after being on multiple antibiotics, including Augmentin  and doxycycline , which she associates with the onset of the infection. She is concerned about taking Augmentin  again due to this past experience.    Past Medical History:  Diagnosis Date   Allergy    Anemia    Chicken pox    Epilepsy (HCC)    Hx of Epilepsy. 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   Tricuspid regurgitation     Past Surgical History:  Procedure Laterality Date   ABDOMINAL HYSTERECTOMY N/A 03/28/2016   Procedure: HYSTERECTOMY ABDOMINAL;  Surgeon: Krystal Deaner, MD;  Location: [facility];  Service: Gynecology;   Laterality: N/A;   BILATERAL SALPINGECTOMY Bilateral 03/28/2016   Procedure: BILATERAL SALPINGECTOMY;  Surgeon: Krystal Deaner, MD;  Location: [facility];  Service: Gynecology;  Laterality: Bilateral;   DILATION AND CURETTAGE OF UTERUS     Heavy Periods   VARICOSE VEIN SURGERY      Family History  Problem Relation Age of Onset   Hypertension Mother    Brain cancer Mother    Prostate cancer Father    Hypertension Father    Liver cancer Father    Stomach cancer Father    Heart disease Father    Hypertension Brother        Twin Brother   Heart disease Brother 70       coronary stent    Social History   Socioeconomic History   Marital status: Single    Spouse name: Not on file   Number of children: Not on file   Years of education: Not on file   Highest education level: Not on file  Occupational History   Occupation: CNA    Employer: [employer]  Tobacco Use   Smoking status: Never   Smokeless tobacco: Never  Substance and Sexual Activity   Alcohol use: No    Alcohol/week: 0.0 standard drinks of alcohol   Drug use: No   Sexual activity: Not Currently    Partners: Male    Birth control/protection: None  Other Topics Concern   Not on file  Social History Narrative   Exercise ----  no except walking at work    Lives with her daughter    Social Drivers of Corporate investment banker Strain: Not on file  Food Insecurity: Not on file  Transportation Needs: Not on file  Physical Activity: Not on file  Stress: Not on file  Social Connections: Not on file  Intimate Partner Violence: Not on file    Outpatient Medications Prior to Visit  Medication Sig Dispense Refill   albuterol  (VENTOLIN  HFA) 108 (90 Base) MCG/ACT inhaler INHALE 2 PUFFS EVERY 6 HOURS AS NEEDED FOR WHEEZING OR SHORTNESS OF BREATH 8.5 g 1   cetirizine  (ZYRTEC ) 10 MG tablet TAKE 1 TABLET(10 MG) BY MOUTH DAILY 90 tablet 1   dicyclomine  (BENTYL ) 10 MG capsule Take 1 capsule (10 mg total) by mouth 4 (four)  times daily -  before meals and at bedtime. 45 capsule 1   furosemide  (LASIX ) 40 MG tablet TAKE 1 TABLET(40 MG) BY MOUTH DAILY 90 tablet 1   mometasone  (NASONEX ) 50 MCG/ACT nasal spray Place 2 sprays into the nose at bedtime. As needed. 17 g 3   montelukast  (SINGULAIR ) 10 MG tablet Take 1 tablet (10 mg total) by mouth at bedtime. 90 tablet 1   MULTIPLE VITAMIN PO Take 1 tablet by mouth daily.      predniSONE  (DELTASONE ) 10 MG tablet TAKE 3 TABLETS PO QD FOR 3 DAYS THEN TAKE 2 TABLETS PO QD FOR 3 DAYS THEN TAKE 1 TABLET PO QD FOR 3 DAYS THEN TAKE 1/2 TAB PO QD FOR 3 DAYS 20 tablet 0   Vitamin D , Ergocalciferol , (DRISDOL ) 1.25 MG (50000 UNIT) CAPS capsule Take 1 capsule (50,000 Units total) by mouth every 7 (seven) days. 12 capsule 1   No facility-administered medications prior to visit.    No Known Allergies  Review of Systems  Constitutional:  Negative for fever and malaise/fatigue.  HENT:  Positive for sore throat. Negative for congestion and sinus pain.   Eyes:  Negative for blurred vision.  Respiratory:  Negative for cough and shortness of breath.   Cardiovascular:  Negative for chest pain, palpitations and leg swelling.  Gastrointestinal:  Negative for vomiting.  Musculoskeletal:  Negative for back pain.  Skin:  Negative for rash.  Neurological:  Negative for loss of consciousness and headaches.       Objective:    Physical Exam Vitals and nursing note reviewed.  Constitutional:      General: She is not in acute distress.    Appearance: Normal appearance. She is well-developed.  HENT:     Head: Normocephalic and atraumatic.     Mouth/Throat:     Pharynx: Pharyngeal swelling, oropharyngeal exudate and posterior oropharyngeal erythema present.  Eyes:     General: No scleral icterus.       Right eye: No discharge.        Left eye: No discharge.  Cardiovascular:     Rate and Rhythm: Normal rate and regular rhythm.     Heart sounds: No murmur heard. Pulmonary:     Effort:  Pulmonary effort is normal. No respiratory distress.     Breath sounds: Normal breath sounds.  Musculoskeletal:        General: Normal range of motion.     Cervical back: Normal range of motion and neck supple.     Right lower leg: No edema.     Left lower leg: No edema.  Skin:    General: Skin is warm and dry.  Neurological:  Mental Status: She is alert and oriented to person, place, and time.  Psychiatric:        Mood and Affect: Mood normal.        Behavior: Behavior normal.        Thought Content: Thought content normal.        Judgment: Judgment normal.     BP 128/86 (BP Location: Left Arm, Patient Position: Sitting, Cuff Size: Large)   Pulse 95   Temp 99 F (37.2 C) (Oral)   Resp 12   Ht 5' 6 (1.676 m)   Wt 270 lb 9.6 oz (122.7 kg)   LMP 03/28/2016 (Exact Date)   SpO2 97%   BMI 43.68 kg/m  Wt Readings from Last 3 Encounters:  05/26/24 270 lb 9.6 oz (122.7 kg)  05/15/24 270 lb 12.8 oz (122.8 kg)  10/18/23 269 lb 3.2 oz (122.1 kg)    Diabetic Foot Exam - Simple   No data filed    Lab Results  Component Value Date   WBC 10.7 05/15/2024   HGB 12.1 05/15/2024   HCT 40.4 05/15/2024   PLT 352 05/15/2024   GLUCOSE 87 05/15/2024   CHOL 133 05/15/2024   TRIG 69 05/15/2024   HDL 52 05/15/2024   LDLCALC 66 05/15/2024   ALT 16 05/15/2024   AST 15 05/15/2024   NA 140 05/15/2024   K 4.0 05/15/2024   CL 104 05/15/2024   CREATININE 0.76 05/15/2024   BUN 15 05/15/2024   CO2 26 05/15/2024   TSH 1.46 05/15/2024   HGBA1C 5.9 05/14/2023    Lab Results  Component Value Date   TSH 1.46 05/15/2024   Lab Results  Component Value Date   WBC 10.7 05/15/2024   HGB 12.1 05/15/2024   HCT 40.4 05/15/2024   MCV 69.9 (L) 05/15/2024   PLT 352 05/15/2024   Lab Results  Component Value Date   NA 140 05/15/2024   K 4.0 05/15/2024   CO2 26 05/15/2024   GLUCOSE 87 05/15/2024   BUN 15 05/15/2024   CREATININE 0.76 05/15/2024   BILITOT 0.3 05/15/2024   ALKPHOS 76  05/14/2023   AST 15 05/15/2024   ALT 16 05/15/2024   PROT 6.6 05/15/2024   ALBUMIN 4.0 05/14/2023   CALCIUM 9.1 05/15/2024   ANIONGAP 7 03/16/2016   EGFR 95 05/15/2024   GFR 88.72 05/14/2023   Lab Results  Component Value Date   CHOL 133 05/15/2024   Lab Results  Component Value Date   HDL 52 05/15/2024   Lab Results  Component Value Date   LDLCALC 66 05/15/2024   Lab Results  Component Value Date   TRIG 69 05/15/2024   Lab Results  Component Value Date   CHOLHDL 2.6 05/15/2024   Lab Results  Component Value Date   HGBA1C 5.9 05/14/2023       Assessment & Plan:  Pharyngitis, unspecified etiology -     Amoxicillin ; Take 1 tablet (875 mg total) by mouth 2 (two) times daily for 10 days.  Dispense: 20 tablet; Refill: 0 -     POCT Influenza A/B -     POCT rapid strep A -     POC COVID-19 BinaxNow  Assessment and Plan Assessment & Plan Acute pharyngitis   She presents with acute pharyngitis, experiencing a sore throat and difficulty swallowing since Wednesday after a shingles vaccination. There is no fever or other systemic symptoms. Throat examination shows erythema and white exudates, indicating a possible streptococcal infection. Despite a  negative rapid test, clinical suspicion for streptococcal pharyngitis remains high. She has a history of C. difficile infection after using antibiotics, particularly Augmentin . She is concerned about antibiotic choice due to this history. Amoxicillin  is considered as it typically does not cause C. difficile infection. Prescribe antibiotics for suspected streptococcal pharyngitis, avoiding Augmentin . Advise rest and symptomatic treatment with Tylenol  for pain relief. Instruct her to gargle with salt water for throat discomfort. Provide a work note for one full day off if antibiotics are started.  Flu, strep and covid --- neg Clinically looks like strep---  start amoxil   Return to office as needed   Aquita Simmering R Lowne Chase, DO

## 2024-05-27 ENCOUNTER — Ambulatory Visit (INDEPENDENT_AMBULATORY_CARE_PROVIDER_SITE_OTHER): Payer: PRIVATE HEALTH INSURANCE | Admitting: Sports Medicine

## 2024-05-27 ENCOUNTER — Encounter: Payer: Self-pay | Admitting: Family

## 2024-05-27 VITALS — BP 138/70 | HR 96 | Ht 66.0 in | Wt 269.0 lb

## 2024-05-27 DIAGNOSIS — M25561 Pain in right knee: Secondary | ICD-10-CM

## 2024-05-27 DIAGNOSIS — G5602 Carpal tunnel syndrome, left upper limb: Secondary | ICD-10-CM | POA: Diagnosis not present

## 2024-05-27 DIAGNOSIS — G8929 Other chronic pain: Secondary | ICD-10-CM

## 2024-05-27 DIAGNOSIS — M79642 Pain in left hand: Secondary | ICD-10-CM

## 2024-05-27 DIAGNOSIS — M25572 Pain in left ankle and joints of left foot: Secondary | ICD-10-CM

## 2024-05-27 DIAGNOSIS — M1711 Unilateral primary osteoarthritis, right knee: Secondary | ICD-10-CM

## 2024-05-27 MED ORDER — CELECOXIB 200 MG PO CAPS: 200.0000 mg | ORAL_CAPSULE | Freq: Two times a day (BID) | ORAL | 0 refills | Status: DC

## 2024-05-27 NOTE — Patient Instructions (Signed)
 Celebrex  200 mg 2x daily for 3 weeks   PT referral   HEP   Sign a records release form   4 week follow up

## 2024-06-15 ENCOUNTER — Encounter: Payer: Self-pay | Admitting: Family

## 2024-06-22 ENCOUNTER — Encounter: Payer: Self-pay | Admitting: Physical Therapy

## 2024-06-22 ENCOUNTER — Ambulatory Visit (INDEPENDENT_AMBULATORY_CARE_PROVIDER_SITE_OTHER): Payer: PRIVATE HEALTH INSURANCE | Admitting: Physical Therapy

## 2024-06-22 ENCOUNTER — Other Ambulatory Visit: Payer: Self-pay

## 2024-06-22 DIAGNOSIS — M25561 Pain in right knee: Secondary | ICD-10-CM

## 2024-06-22 DIAGNOSIS — M6281 Muscle weakness (generalized): Secondary | ICD-10-CM | POA: Diagnosis not present

## 2024-06-22 DIAGNOSIS — M79642 Pain in left hand: Secondary | ICD-10-CM | POA: Diagnosis not present

## 2024-06-22 DIAGNOSIS — M25572 Pain in left ankle and joints of left foot: Secondary | ICD-10-CM | POA: Diagnosis not present

## 2024-06-22 DIAGNOSIS — G8929 Other chronic pain: Secondary | ICD-10-CM

## 2024-06-22 NOTE — Patient Instructions (Signed)
 Access Code: [email or web address] URL: [email or web address] Date: 06/22/2024 Prepared by: Elaine Daring  Exercises - Long Sitting Ankle Plantar Flexion with Resistance  - 1 x daily - 2 sets - 15 reps - Long Sitting Ankle Eversion with Resistance  - 1 x daily - 2 sets - 15 reps - Seated Ankle Inversion with Resistance and Legs Crossed  - 1 x daily - 2 sets - 15 reps - Seated Ankle Dorsiflexion with Resistance  - 1 x daily - 2 sets - 15 reps - Standing Gastroc Stretch at Counter  - 1 x daily - 3 reps - 20 seconds hold - Sit to Stand Without Arm Support  - 1 x daily - 3 sets - 5 reps - Seated Long Arc Quad  - 20 reps - Seated Heel Toe Raises  - 20 reps

## 2024-06-22 NOTE — Therapy (Signed)
 OUTPATIENT PHYSICAL THERAPY EVALUATION   Patient Name: Laurie Monroe MRN: [id number] DOB:1971/06/02, 53 y.o., female Today's Date: 06/23/2024   END OF SESSION:  PT End of Session - 06/22/24 1614     Visit Number 1    Number of Visits 9    Date for Recertification  08/17/24    Authorization Type [employer]    PT Start Time 1602    PT Stop Time 1645    PT Time Calculation (min) 43 min    Activity Tolerance Patient tolerated treatment well    Behavior During Therapy WFL for tasks assessed/performed          Past Medical History:  Diagnosis Date   Allergy    Anemia    Chicken pox    Epilepsy (HCC)    Hx of Epilepsy. 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   Tricuspid regurgitation    Past Surgical History:  Procedure Laterality Date   ABDOMINAL HYSTERECTOMY N/A 03/28/2016   Procedure: HYSTERECTOMY ABDOMINAL;  Surgeon: Krystal Deaner, MD;  Location: [facility];  Service: Gynecology;  Laterality: N/A;   BILATERAL SALPINGECTOMY Bilateral 03/28/2016   Procedure: BILATERAL SALPINGECTOMY;  Surgeon: Krystal Deaner, MD;  Location: [facility];  Service: Gynecology;  Laterality: Bilateral;   DILATION AND CURETTAGE OF UTERUS     Heavy Periods   VARICOSE VEIN SURGERY     Patient Active Problem List   Diagnosis Date Noted   Chronic pain of right knee 05/15/2024   Chronic pain of left ankle 05/15/2024   Numbness and tingling in left hand 05/15/2024   Irritable bowel syndrome with diarrhea 05/15/2024   C. 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   S/P abdominal hysterectomy 03/28/2016   Leukocytosis 12/28/2015   Thalassemia trait, alpha 12/28/2015   HTN (hypertension) 11/07/2015   Elevated BP 10/10/2015   GERD (gastroesophageal reflux disease) 10/10/2015   Chest pain 10/10/2015   Edema 10/10/2015   Lower extremity edema 10/10/2015   Chronic venous insufficiency 04/18/2015   Onychomycosis 09/01/2013   Pain in toe 09/01/2013   Seizures (HCC) 07/08/2012    PCP: Antonio Cyndee Jamee JONELLE, DO  REFERRING PROVIDER: Leonce Katz, DO  REFERRING DIAG: Chronic pain of left ankle; Left carpal tunnel syndrome; Left hand pain; Chronic pain of right knee; Primary osteoarthritis of right knee  THERAPY DIAG:  Pain in left ankle and joints of left foot  Chronic pain of right knee  Pain in left hand  Muscle weakness (generalized)  Rationale for Evaluation and Treatment: Rehabilitation  ONSET DATE: Chronic   SUBJECTIVE:  SUBJECTIVE STATEMENT: Patient reports left ankle pain that she feels like started after wearing the wrong shoes to a football game. She states no difference since taking medication. She reports the left ankle gives her the most trouble, it  takes her a while to warm up the ankle to be able to walk in the morning or after sitting extended periods of time. She is on her feet most of the day at work. She had imaging that that showed she tore a tendon in the left ankle. She also reports right knee pain that she was told was arthritis. Sitting for long periods of time and then getting up will aggravate the right knee. She also reports left hand numbness that will last until she shakes the hand. The left hand hasn't been bothering as long as the  knee or ankle. The numbness will come and go. Patient is right handed.   PERTINENT HISTORY: See PMH above  PAIN:  Are you having pain? Yes:  NPRS scale: 7/10 with walking Pain location: Left ankle Pain description: asleep Aggravating factors: Walking Relieving factors: Medication (prednisone )  NPRS scale: 4/10 with walking Pain location: Right knee Pain description: Stiff, achy  Aggravating factors: Getting up after sitting for a while Relieving factors: Medication (tylenol )   NPRS scale: 0/10 currently, 10/10 when pain occurs Pain location: Left hand Pain description: Throbbing, numbness Aggravating factors: Nothing specific, pain comes and goes Relieving factors: Shaking the hand  PRECAUTIONS: None  RED FLAGS: None   WEIGHT BEARING RESTRICTIONS: No  FALLS:  Has patient fallen in last 6 months? No  OCCUPATION: Nursing assistant  PLOF: Independent  PATIENT GOALS: Pain relief and improve walking   OBJECTIVE:  Note: Objective measures were completed at Evaluation unless otherwise noted. PATIENT SURVEYS:  PSFS: 2.3 Crossing my right leg to put on ted hose: 5 Running if there is an emergency: 2 Getting on my knees to pray and getting up without holding on: 0  COGNITION: Overall cognitive status: Within functional limits for tasks assessed     SENSATION: WFL  EDEMA:  Not formally measured but she does exhibit bilateral ankle edema that seems worse on the left  MUSCLE LENGTH: Left calf flexibility deficit  POSTURE:   Right knee varus alignment  PALPATION: Tenderness noted generally about the left ankle, peripatellar region and medial joint line  LOWER EXTREMITY ROM:  Active ROM Right eval Left eval  Hip flexion    Hip extension    Hip abduction    Hip adduction    Hip internal rotation    Hip external rotation    Knee flexion 104 95  Knee extension    Ankle dorsiflexion 10 0  Ankle plantarflexion 32 34  Ankle inversion 38 24  Ankle  eversion 18 7   (Blank rows = not tested)  LOWER EXTREMITY MMT:  MMT Right eval Left eval  Hip flexion    Hip extension    Hip abduction    Hip adduction    Hip internal rotation    Hip external rotation    Knee flexion 4+ 5  Knee extension 4 5  Ankle dorsiflexion 5 4+  Ankle plantarflexion 5 3  Ankle inversion 5 3+  Ankle eversion 5 4-   (Blank rows = not tested)  FUNCTIONAL TESTS:  5 times sit to stand: 13 seconds  GAIT: Assistive device utilized: None Level of assistance: Complete Independence Comments: Antalgia and trendelenburg bilaterally but primarily on right  TREATMENT [facility] Adult PT Treatment:                                                DATE: 06/22/2024 Longsitting ankle PF and eversion with red x 15 each Seated ankle inversion and DF with red x 15 Calf stretch at counter 2 x 30 sec Sit to stand 2 x 5  Discussed compression and other methods to address left ankle swelling, using movements such as seated heel-toe raises and LAQ to keep ankle and knee joints moving while seated or when about to get up after sitting to reduce stiffness   PATIENT EDUCATION:  Education details: Exam findings, POC, HEP Person educated: Patient Education method: Explanation, Demonstration, Tactile cues, Verbal cues, and Handouts Education comprehension: verbalized understanding, returned demonstration, verbal cues required, tactile cues required, and needs further education  HOME EXERCISE PROGRAM: Access Code: [phone]    ASSESSMENT: CLINICAL IMPRESSION: Patient is a 53 y.o. female who was seen today for physical therapy evaluation and treatment for chronic left ankle pain, right knee pain, and left hand pain. Evaluation focused primarily on left ankle and right knee due to time restraint. She demonstrates limitations in both her left ankle and knee  mobility, as well as strength deficits of the left ankle and right knee. She does also exhibit gait deviations and mobility deficit with her sit to stands. Will further assess her left wrist/hand as indicated in future session.   OBJECTIVE IMPAIRMENTS: Abnormal gait, decreased activity tolerance, decreased ROM, decreased strength, impaired flexibility, and pain.   ACTIVITY LIMITATIONS: standing, squatting, stairs, transfers, and locomotion level  PARTICIPATION LIMITATIONS: cleaning, shopping, community activity, and yard work  PERSONAL FACTORS: Fitness, Past/current experiences, and Time since onset of injury/illness/exacerbation are also affecting patient's functional outcome.   REHAB POTENTIAL: Good  CLINICAL DECISION MAKING: Stable/uncomplicated  EVALUATION COMPLEXITY: Low   GOALS: Goals reviewed with patient? Yes  SHORT TERM GOALS: Target date: 07/20/2024  Patient will be I with initial HEP in order to progress with therapy. Baseline: HEP provided at eval Goal status: INITIAL  2.  Patient will report left ankle pain </= 4/10 with walking in order to reduce functional limitations Baseline: 7/10 Goal status: INITIAL  3.  Patient will demonstrate left ankle DF >/= 8 deg to improve gait and reduce ankle pain Baseline: 0 deg Goal status: INITIAL  LONG TERM GOALS: Target date: 08/17/2024  Patient will be I with final HEP to maintain progress from PT. Baseline: HEP provided at eval Goal status: INITIAL  2.  Patient will report PSFS >/= 6 in order to indicate improvement in their functional ability. Baseline: 2.3 Goal status: INITIAL  3.  Patient will demonstrate left ankle strength >/= 4+/5 MMT in order to improve her walking and activity tolerance Baseline: see limitations above Goal status: INITIAL  4.  Patient will perform 5xSTS </= 9 sec in order to indicate improvement in her LE strength and functional mobility Baseline: 13 seconds Goal status:  INITIAL    PLAN: PT FREQUENCY: 1x/week  PT DURATION: 8 weeks  PLANNED INTERVENTIONS: [id number]- PT Re-evaluation, 97750- Physical Performance Testing, 97110-Therapeutic exercises, 97530- Therapeutic activity, [id number]- Neuromuscular re-education, 97535- Self Care, [id number]- Manual therapy, [phone]- Gait training, Patient/Family education, Balance training, Stair training, Taping, Joint mobilization, Joint manipulation, Cryotherapy, and Moist heat  PLAN FOR NEXT SESSION: Review HEP and progress PRN, manual for  left ankle and right knee, progress left ankle strength and general LE and hip strengthening, gait and balance training, further assess left wrist/hand as needed   Elaine Daring, PT, DPT, LAT, ATC 06/23/24  8:32 AM Phone: [phone] Fax: [phone]

## 2024-06-23 NOTE — Progress Notes (Unsigned)
 [name] [address] [place] [address] Phone: [phone]   Assessment and Plan:     1. Chronic pain of left ankle (Primary) -Chronic with exacerbation, subsequent visit - Continued ankle pain consistent with multiple findings seen on left ankle MRI - Recommend continuing HEP and physical therapy - No significant improvement with Celebrex , so discontinue Celebrex  - May use prednisone  Dosepak if pain becomes flared.  Prescription provided. - Could consider CSI at follow-up visit if no improvement - MRI 08/13/2023 shows impression: 1.  Split tear with moderate tendinosis and tenosynovitis of tibialis posterior. 2.  Mild tenosynovitis of peroneus longus and flexor digitorum longus. 3.  Attenuated ATFL, likely related to remote injury. 4.  Findings suggesting mild chronic plantar fasciitis.   2. Left carpal tunnel syndrome 3. Left hand pain -Chronic with exacerbation, subsequent visit - Overall improvement in intermittent numbness and tingling left hand consistent with intermittent carpal tunnel syndrome - Continue carpal tunnel HEP - No need for injection at this time  4. Chronic pain of right knee 5. Primary osteoarthritis of right knee -Chronic with exacerbation, subsequent visit - Most consistent with flare of osteoarthritis of right knee.  No significant improvement with conservative therapy - Continue HEP and physical therapy - No significant improvement with Celebrex .  Discontinue Celebrex  - May use prednisone  pack as needed for pain flares.  Prescription provided - Could consider CSI at follow-up visit if no significant improvement    Pertinent previous records reviewed include none   Follow Up: 6 to 8 weeks for reevaluation.  If no significant improvement or worsening of symptoms, could consider knee CSI versus carpal tunnel CSI versus ankle CSI   Subjective:   I, Laurie Monroe, am serving as a Neurosurgeon  for Doctor Morene Mace   Chief Complaint: left ankle and left hand pain    HPI:    05/27/2024 Patient is a 53 year old female with left ankle and hand pain. Patient states left ankle pain started last year. Hx of torn tendon in left ankle. No surgery recommended. She still has pain. Tylenol  for the pain and that helps but not enough. Total ankle pain. Antalgic gait. No radiating pain.    Left hand pain intermittent numbness and tingling total hand. This sensation only last a couple of seconds. Grip strength intact. ROM WNL    Right knee pain for a while. Feels like its going to give out. Dx of arthritis when she saw PCP. No radiating pain. Pain located to patellar tendon  06/24/2024 Patient states she is okay. Just started PT Monday   Relevant Historical Information: Pulmonary hypertension, GERD, elevated BMI  Additional pertinent review of systems negative.   Current Outpatient Medications:    methylPREDNISolone  (MEDROL  DOSEPAK) 4 MG TBPK tablet, Take 6 tablets on day 1.  Take 5 tablets on day 2.  Take 4 tablets on day 3.  Take 3 tablets on day 4.  Take 2 tablets on day 5.  Take 1 tablet on day 6., Disp: 21 tablet, Rfl: 0   albuterol  (VENTOLIN  HFA) 108 (90 Base) MCG/ACT inhaler, INHALE 2 PUFFS EVERY 6 HOURS AS NEEDED FOR WHEEZING OR SHORTNESS OF BREATH, Disp: 8.5 g, Rfl: 1   celecoxib  (CELEBREX ) 200 MG capsule, Take 1 capsule (200 mg total) by mouth 2 (two) times daily., Disp: 60 capsule, Rfl: 0   cetirizine  (ZYRTEC ) 10 MG tablet, TAKE 1 TABLET(10 MG) BY MOUTH DAILY, Disp: 90 tablet, Rfl: 1  dicyclomine  (BENTYL ) 10 MG capsule, Take 1 capsule (10 mg total) by mouth 4 (four) times daily -  before meals and at bedtime., Disp: 45 capsule, Rfl: 1   furosemide  (LASIX ) 40 MG tablet, TAKE 1 TABLET(40 MG) BY MOUTH DAILY, Disp: 90 tablet, Rfl: 1   mometasone  (NASONEX ) 50 MCG/ACT nasal spray, Place 2 sprays into the nose at bedtime. As needed., Disp: 17 g, Rfl: 3   montelukast  (SINGULAIR ) 10  MG tablet, Take 1 tablet (10 mg total) by mouth at bedtime., Disp: 90 tablet, Rfl: 1   MULTIPLE VITAMIN PO, Take 1 tablet by mouth daily. , Disp: , Rfl:    predniSONE  (DELTASONE ) 10 MG tablet, TAKE 3 TABLETS PO QD FOR 3 DAYS THEN TAKE 2 TABLETS PO QD FOR 3 DAYS THEN TAKE 1 TABLET PO QD FOR 3 DAYS THEN TAKE 1/2 TAB PO QD FOR 3 DAYS, Disp: 20 tablet, Rfl: 0   Vitamin D , Ergocalciferol , (DRISDOL ) 1.25 MG (50000 UNIT) CAPS capsule, Take 1 capsule (50,000 Units total) by mouth every 7 (seven) days., Disp: 12 capsule, Rfl: 1   Objective:     Vitals:   06/24/24 1545  BP: 132/84  Weight: 268 lb (121.6 kg)  Height: 5' 6 (1.676 m)      Body mass index is 43.26 kg/m.    Physical Exam:    General: Well-appearing, cooperative, sitting comfortably in no acute distress.  HEENT: Normocephalic, atraumatic.   Neck: No gross abnormality.  Cardiovascular: No pallor or cyanosis. Resp: Comfortable WOB.   Abdomen: Non distended.   Skin: Warm and dry; no focal rashes identified on limited exam. Extremities: No cyanosis or edema.  TTP left deltoid along medial ankle, left medial malleolus, right medial knee joint line Neuro: Gross motor and sensory intact. Gait normal. Psychiatric: Mood and affect are appropriate.       Electronically signed by:  Odis Mace D.[name] 4:17 PM 06/24/24

## 2024-06-24 ENCOUNTER — Ambulatory Visit: Payer: PRIVATE HEALTH INSURANCE | Admitting: Sports Medicine

## 2024-06-24 VITALS — BP 132/84 | Ht 66.0 in | Wt 268.0 lb

## 2024-06-24 DIAGNOSIS — G8929 Other chronic pain: Secondary | ICD-10-CM

## 2024-06-24 DIAGNOSIS — M1711 Unilateral primary osteoarthritis, right knee: Secondary | ICD-10-CM

## 2024-06-24 DIAGNOSIS — M25572 Pain in left ankle and joints of left foot: Secondary | ICD-10-CM | POA: Diagnosis not present

## 2024-06-24 DIAGNOSIS — G5602 Carpal tunnel syndrome, left upper limb: Secondary | ICD-10-CM

## 2024-06-24 DIAGNOSIS — M79642 Pain in left hand: Secondary | ICD-10-CM | POA: Diagnosis not present

## 2024-06-24 DIAGNOSIS — M25561 Pain in right knee: Secondary | ICD-10-CM

## 2024-06-24 MED ORDER — METHYLPREDNISOLONE 4 MG PO TBPK: ORAL_TABLET | ORAL | 0 refills | Status: AC

## 2024-06-24 NOTE — Patient Instructions (Signed)
 Prednisone  dos pak   Discontinue Celebrex    Continue PT   6-8 week follow up

## 2024-06-30 ENCOUNTER — Encounter: Payer: Self-pay | Admitting: Physical Therapy

## 2024-07-02 ENCOUNTER — Encounter: Payer: Self-pay | Admitting: Physical Therapy

## 2024-07-16 ENCOUNTER — Encounter: Payer: Self-pay | Admitting: Physical Therapy

## 2024-07-16 ENCOUNTER — Encounter: Payer: Self-pay | Admitting: Family

## 2024-07-16 ENCOUNTER — Other Ambulatory Visit: Payer: Self-pay

## 2024-07-16 ENCOUNTER — Ambulatory Visit: Payer: PRIVATE HEALTH INSURANCE | Admitting: Physical Therapy

## 2024-07-16 DIAGNOSIS — M6281 Muscle weakness (generalized): Secondary | ICD-10-CM | POA: Diagnosis not present

## 2024-07-16 DIAGNOSIS — G8929 Other chronic pain: Secondary | ICD-10-CM

## 2024-07-16 DIAGNOSIS — M25561 Pain in right knee: Secondary | ICD-10-CM

## 2024-07-16 DIAGNOSIS — M25572 Pain in left ankle and joints of left foot: Secondary | ICD-10-CM

## 2024-07-16 DIAGNOSIS — M79642 Pain in left hand: Secondary | ICD-10-CM

## 2024-07-16 NOTE — Therapy (Signed)
 OUTPATIENT PHYSICAL THERAPY TREATMENT   Patient Name: Laurie Monroe MRN: [id number] DOB:Oct 13, 1970, 53 y.o., female Today's Date: 07/16/2024   END OF SESSION:  PT End of Session - 07/16/24 1613     Visit Number 2    Number of Visits 9    Date for Recertification  08/17/24    Authorization Type [employer]    PT Start Time 1606    PT Stop Time 1645    PT Time Calculation (min) 39 min    Activity Tolerance Patient tolerated treatment well    Behavior During Therapy WFL for tasks assessed/performed           Past Medical History:  Diagnosis Date   Allergy    Anemia    Chicken pox    Epilepsy (HCC)    Hx of Epilepsy. 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   Tricuspid regurgitation    Past Surgical History:  Procedure Laterality Date   ABDOMINAL HYSTERECTOMY N/A 03/28/2016   Procedure: HYSTERECTOMY ABDOMINAL;  Surgeon: Krystal Deaner, MD;  Location: [facility];  Service: Gynecology;  Laterality: N/A;   BILATERAL SALPINGECTOMY Bilateral 03/28/2016   Procedure: BILATERAL SALPINGECTOMY;  Surgeon: Krystal Deaner, MD;  Location: [facility];  Service: Gynecology;  Laterality: Bilateral;   DILATION AND CURETTAGE OF UTERUS     Heavy Periods   VARICOSE VEIN SURGERY     Patient Active Problem List   Diagnosis Date Noted   Chronic pain of right knee 05/15/2024   Chronic pain of left ankle 05/15/2024   Numbness and tingling in left hand 05/15/2024   Irritable bowel syndrome with diarrhea 05/15/2024   C. 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   S/P abdominal hysterectomy 03/28/2016   Leukocytosis 12/28/2015   Thalassemia trait, alpha 12/28/2015   HTN (hypertension) 11/07/2015   Elevated BP 10/10/2015   GERD (gastroesophageal reflux disease) 10/10/2015   Chest pain 10/10/2015   Edema 10/10/2015   Lower extremity edema 10/10/2015   Chronic venous insufficiency 04/18/2015   Onychomycosis 09/01/2013   Pain in toe 09/01/2013   Seizures (HCC) 07/08/2012    PCP: Antonio Cyndee Jamee JONELLE, DO  REFERRING PROVIDER: Leonce Katz, DO  REFERRING DIAG: Chronic pain of left ankle; Left carpal tunnel syndrome; Left hand pain; Chronic pain of right knee; Primary osteoarthritis of right knee  THERAPY DIAG:  Pain in left ankle and joints of left foot  Chronic pain of right knee  Pain in left hand  Muscle weakness (generalized)  Rationale for Evaluation and Treatment: Rehabilitation  ONSET DATE: Chronic   SUBJECTIVE:  SUBJECTIVE STATEMENT: Patient reports she has been hurting a lot. She did the exercises a few times but she is hurting bad after work so isn't able to do the exercises. States that her ankle is giving her  the most trouble.   Eval: Patient reports left ankle pain that she feels like started after wearing the wrong shoes to a football game. She states no difference since taking medication. She reports the left ankle gives her the most trouble, it takes her a while to warm up the ankle to be able to walk in the morning or after sitting extended periods of time. She is on her feet most of the day at work. She had imaging that that showed she tore a tendon in the left ankle. She also reports right knee pain that she was told was arthritis.  Sitting for long periods of time and then getting up will aggravate the right knee. She also reports left hand numbness that will last until she shakes the hand. The left hand hasn't been bothering as long as the knee or ankle. The numbness will come and go. Patient is right handed.   PERTINENT HISTORY: See PMH above  PAIN:  Are you having pain? Yes:  NPRS scale: 8/10 with walking Pain location: Left ankle Pain description: asleep Aggravating factors: Walking Relieving factors: Medication (prednisone )  NPRS scale: 4/10 with walking Pain location: Right knee Pain description: Stiff, achy  Aggravating factors: Getting up after sitting for a while Relieving factors: Medication (tylenol )   NPRS scale: 0/10 currently, 10/10 when pain occurs Pain location: Left hand Pain description: Throbbing, numbness Aggravating factors: Nothing specific, pain comes and goes Relieving factors: Shaking the hand  PRECAUTIONS: None  PATIENT GOALS: Pain relief and improve walking   OBJECTIVE:  Note: Objective measures were completed at Evaluation unless otherwise noted. PATIENT SURVEYS:  PSFS: 2.3 Crossing my right leg to put on ted hose: 5 Running if there is an emergency: 2 Getting on my knees to pray and getting up without holding on: 0  EDEMA:  Not formally measured but she does exhibit bilateral ankle edema that seems worse on the left  MUSCLE LENGTH: Left calf flexibility deficit  POSTURE:   Right knee varus alignment  PALPATION: Tenderness noted generally about the left ankle, peripatellar region and medial joint line  LOWER EXTREMITY ROM:  Active ROM Right eval Left eval Left 07/16/2024  Hip flexion     Hip extension     Hip abduction     Hip adduction     Hip internal rotation     Hip external rotation     Knee flexion 104 95   Knee extension     Ankle dorsiflexion 10 0 0  Ankle plantarflexion 32 34   Ankle inversion 38 24   Ankle eversion 18 7    (Blank rows =  not tested)  LOWER EXTREMITY MMT:  MMT Right eval Left eval  Hip flexion    Hip extension    Hip abduction    Hip adduction    Hip internal rotation    Hip external rotation    Knee flexion 4+ 5  Knee extension 4 5  Ankle dorsiflexion 5 4+  Ankle plantarflexion 5 3  Ankle inversion 5 3+  Ankle eversion 5 4-   (Blank rows = not tested)  FUNCTIONAL TESTS:  5 times sit to stand: 13 seconds  GAIT: Assistive device utilized: None Level of assistance: Complete Independence Comments: Antalgia and trendelenburg bilaterally but primarily on right  TREATMENT [facility] Adult PT Treatment:                                                DATE: 07/16/2024 STM / edema massage for left foot and lower leg Left talocrural and subtalar joint mobs all directions Left ankle PROM all directions Longsitting ankle inversion and eversion isometrics 10 x 10 sec each Longsitting ankle PF and DF with yellow 3 x 10 each Seated heel toe raises 3 x 10 Seated ankle inversion and eversion with towel 3 x 10  Discussed HEP and ensuring she is doing exercises or keeping the ankle movement for edema management and mobility after work  PATIENT EDUCATION:  Education details: HEP update Person educated: Patient Education method: Programmer, Multimedia, Facilities Manager, Actor cues, Verbal cues, and Handouts Education comprehension: verbalized understanding, returned demonstration, verbal cues required, tactile cues required, and needs further education  HOME EXERCISE PROGRAM: Access Code: [id number]    ASSESSMENT: CLINICAL IMPRESSION: Patient tolerated therapy well with no adverse effects. She arrives reporting increased left ankle pain due to being on her feet all day so therapy focused on improving ankle mobility and gentle muscle activation/strengthening with good tolerance. She does exhibit  increased left ankle edema and tenderness generally around the medial and lateral ankle. She did report improvement in symptoms following therapy. Updated HEP to incorporate more active ankle motion exercises. Patient would benefit from continued skilled PT to progress mobility and strength in order to reduce pain and maximize functional ability.   Eval: Patient is a 53 y.o. female who was seen today for physical therapy evaluation and treatment for chronic left ankle pain, right knee pain, and left hand pain. Evaluation focused primarily on left ankle and right knee due to time restraint. She demonstrates limitations in both her left ankle and knee mobility, as well as strength deficits of the left ankle and right knee. She does also exhibit gait deviations and mobility deficit with her sit to stands. Will further assess her left wrist/hand as indicated in future session.   OBJECTIVE IMPAIRMENTS: Abnormal gait, decreased activity tolerance, decreased ROM, decreased strength, impaired flexibility, and pain.   ACTIVITY LIMITATIONS: standing, squatting, stairs, transfers, and locomotion level  PARTICIPATION LIMITATIONS: cleaning, shopping, community activity, and yard work  PERSONAL FACTORS: Fitness, Past/current experiences, and Time since onset of injury/illness/exacerbation are also affecting patient's functional outcome.    GOALS: Goals reviewed with patient? Yes  SHORT TERM GOALS: Target date: 07/20/2024  Patient will be I with initial HEP in order to progress with therapy. Baseline: HEP provided at eval Goal status: INITIAL  2.  Patient will report left ankle pain </= 4/10 with walking in order to reduce functional limitations Baseline: 7/10 Goal status: INITIAL  3.  Patient will demonstrate left ankle DF >/= 8 deg to improve gait and reduce ankle pain Baseline: 0 deg Goal status: INITIAL  LONG TERM GOALS: Target date: 08/17/2024  Patient will be I with final HEP to maintain  progress from PT. Baseline: HEP provided at eval Goal status: INITIAL  2.  Patient will report PSFS >/= 6 in order to indicate improvement in their functional ability. Baseline: 2.3 Goal status: INITIAL  3.  Patient will demonstrate left ankle strength >/= 4+/5 MMT in order to improve her walking and activity tolerance Baseline: see limitations above Goal status: INITIAL  4.  Patient will perform  5xSTS </= 9 sec in order to indicate improvement in her LE strength and functional mobility Baseline: 13 seconds Goal status: INITIAL    PLAN: PT FREQUENCY: 1x/week  PT DURATION: 8 weeks  PLANNED INTERVENTIONS: 97164- PT Re-evaluation, 97750- Physical Performance Testing, 97110-Therapeutic exercises, 97530- Therapeutic activity, 97112- Neuromuscular re-education, 97535- Self Care, [id number]- Manual therapy, [phone]- Gait training, Patient/Family education, Balance training, Stair training, Taping, Joint mobilization, Joint manipulation, Cryotherapy, and Moist heat  PLAN FOR NEXT SESSION: Review HEP and progress PRN, manual for left ankle and right knee, progress left ankle strength and general LE and hip strengthening, gait and balance training, further assess left wrist/hand as needed   Elaine Daring, PT, DPT, LAT, ATC 07/16/24  4:54 PM Phone: [phone] Fax: [phone]

## 2024-07-16 NOTE — Patient Instructions (Signed)
 Access Code: [email or web address] URL: [email or web address] Date: 07/16/2024 Prepared by: Elaine Daring  Exercises - Long Sitting Ankle Plantar Flexion with Resistance  - 1 x daily - 2 sets - 15 reps - Long Sitting Ankle Eversion with Resistance  - 1 x daily - 2 sets - 15 reps - Seated Ankle Inversion with Resistance and Legs Crossed  - 1 x daily - 2 sets - 15 reps - Seated Ankle Dorsiflexion with Resistance  - 1 x daily - 2 sets - 15 reps - Standing Gastroc Stretch at Counter  - 1 x daily - 3 reps - 20 seconds hold - Sit to Stand Without Arm Support  - 1 x daily - 3 sets - 5 reps - Seated Long Arc Quad  - 20 reps - Seated Heel Toe Raises  - 20 reps - Ankle Inversion Eversion Towel Slide  - 20 reps

## 2024-07-23 ENCOUNTER — Ambulatory Visit: Payer: PRIVATE HEALTH INSURANCE | Admitting: Physical Therapy

## 2024-07-23 DIAGNOSIS — M25572 Pain in left ankle and joints of left foot: Secondary | ICD-10-CM

## 2024-07-23 DIAGNOSIS — M6281 Muscle weakness (generalized): Secondary | ICD-10-CM

## 2024-07-23 DIAGNOSIS — M79642 Pain in left hand: Secondary | ICD-10-CM | POA: Diagnosis not present

## 2024-07-23 DIAGNOSIS — M25561 Pain in right knee: Secondary | ICD-10-CM | POA: Diagnosis not present

## 2024-07-23 DIAGNOSIS — G8929 Other chronic pain: Secondary | ICD-10-CM

## 2024-07-23 NOTE — Therapy (Signed)
 OUTPATIENT PHYSICAL THERAPY TREATMENT   Patient Name: Laurie Monroe MRN: [id number] DOB:[birth year], 53 y.o., female Today's Date: 07/24/2024   END OF SESSION:  PT End of Session - 07/24/24 1057     Visit Number 3    Number of Visits 9    Date for Recertification  08/17/24    Authorization Type [employer]    PT Start Time 1610    PT Stop Time 1650    PT Time Calculation (min) 40 min    Activity Tolerance Patient tolerated treatment well    Behavior During Therapy WFL for tasks assessed/performed            Past Medical History:  Diagnosis Date   Allergy    Anemia    Chicken pox    Epilepsy (HCC)    Hx of Epilepsy. 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   Tricuspid regurgitation    Past Surgical History:  Procedure Laterality Date   ABDOMINAL HYSTERECTOMY N/A 03/28/2016   Procedure: HYSTERECTOMY ABDOMINAL;  Surgeon: Krystal Deaner, MD;  Location: [facility];  Service: Gynecology;  Laterality: N/A;   BILATERAL SALPINGECTOMY Bilateral 03/28/2016   Procedure: BILATERAL SALPINGECTOMY;  Surgeon: Krystal Deaner, MD;  Location: [facility];  Service: Gynecology;  Laterality: Bilateral;   DILATION AND CURETTAGE OF UTERUS     Heavy Periods   VARICOSE VEIN SURGERY     Patient Active Problem List   Diagnosis Date Noted   Chronic pain of right knee 05/15/2024   Chronic pain of left ankle 05/15/2024   Numbness and tingling in left hand 05/15/2024   Irritable bowel syndrome with diarrhea 05/15/2024   C. 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   S/P abdominal hysterectomy 03/28/2016   Leukocytosis 12/28/2015   Thalassemia trait, alpha 12/28/2015   HTN (hypertension) 11/07/2015   Elevated BP 10/10/2015   GERD (gastroesophageal reflux disease) 10/10/2015   Chest pain 10/10/2015   Edema 10/10/2015   Lower extremity edema 10/10/2015   Chronic venous insufficiency 04/18/2015   Onychomycosis 09/01/2013   Pain in toe 09/01/2013   Seizures (HCC) 07/08/2012    PCP: Antonio Cyndee Jamee JONELLE, DO  REFERRING PROVIDER: Leonce Katz, DO  REFERRING DIAG: Chronic pain of left ankle; Left carpal tunnel syndrome; Left hand pain; Chronic pain of right knee; Primary osteoarthritis of right knee  THERAPY DIAG:  Pain in left ankle and joints of left foot  Chronic pain of right knee  Pain in left hand  Muscle weakness (generalized)  Rationale for Evaluation and Treatment: Rehabilitation  ONSET DATE: Chronic   SUBJECTIVE:  SUBJECTIVE STATEMENT: Patient reports she is still having the primary left ankle and foot pain. She denies any left hand pain, and every now and them will have the right knee pain but it's not continuous like  the ankle. The left ankle continues to bother her with walking and standing at work.   Eval: Patient reports left ankle pain that she feels like started after wearing the wrong shoes to a football game. She states no difference since taking medication. She reports the left ankle gives her the most trouble, it takes her a while to warm up the ankle to be able to walk in the morning or after sitting extended periods of time. She is on her feet most of the day at work. She had imaging that that showed she tore a tendon in the left  ankle. She also reports right knee pain that she was told was arthritis. Sitting for long periods of time and then getting up will aggravate the right knee. She also reports left hand numbness that will last until she shakes the hand. The left hand hasn't been bothering as long as the knee or ankle. The numbness will come and go. Patient is right handed.   PERTINENT HISTORY: See PMH above  PAIN:  Are you having pain? Yes:  NPRS scale: 7/10 with walking Pain location: Left ankle Pain description: asleep Aggravating factors: Walking Relieving factors: Medication (prednisone )  NPRS scale: 2/10 with walking Pain location: Right knee Pain description: Stiff, achy  Aggravating factors: Getting up after sitting for a while Relieving factors: Medication (tylenol )   NPRS scale: 0/10 currently, 10/10 when pain occurs Pain location: Left hand Pain description: Throbbing, numbness Aggravating factors: Nothing specific, pain comes and goes Relieving factors: Shaking the hand  PRECAUTIONS: None  PATIENT GOALS: Pain relief and improve walking   OBJECTIVE:  Note: Objective measures were completed at Evaluation unless otherwise noted. PATIENT SURVEYS:  PSFS: 2.3 Crossing my right leg to put on ted hose: 5 Running if there is an emergency: 2 Getting on my knees to pray and getting up without holding on: 0  EDEMA:  Not formally measured but she does exhibit bilateral ankle edema that seems worse on the left  MUSCLE LENGTH: Left calf flexibility deficit  POSTURE:   Right knee varus alignment  PALPATION: Tenderness noted generally about the left ankle, peripatellar region and medial joint line  LOWER EXTREMITY ROM:  Active ROM Right eval Left eval Left 07/16/2024  Hip flexion     Hip extension     Hip abduction     Hip adduction     Hip internal rotation     Hip external rotation     Knee flexion 104 95   Knee extension     Ankle dorsiflexion 10 0 0  Ankle plantarflexion  32 34   Ankle inversion 38 24   Ankle eversion 18 7    (Blank rows = not tested)  LOWER EXTREMITY MMT:  MMT Right eval Left eval Left 07/24/2024  Hip flexion     Hip extension     Hip abduction     Hip adduction     Hip internal rotation     Hip external rotation     Knee flexion 4+ 5   Knee extension 4 5   Ankle dorsiflexion 5 4+ 4+  Ankle plantarflexion 5 3   Ankle inversion 5 3+ 4-  Ankle eversion 5 4- 4-   (Blank rows = not tested)  FUNCTIONAL TESTS:  5 times sit to stand: 13 seconds  GAIT: Assistive device utilized: None Level of assistance: Complete Independence Comments: Antalgia and trendelenburg bilaterally but primarily on right  TREATMENT [facility] Adult PT Treatment:                                                DATE: 07/23/2024 STM / edema massage for left foot and lower leg STM left plantar fascia Left talocrural and subtalar joint mobs all directions Left ankle PROM all directions Left passive calf stretch Longsitting 4-way ankle with yellow 2 x 15 each Longsitting and seated ankle eversion with yellow loop around mid foot x 10 each position Seated heel toe raises x 20  PATIENT EDUCATION:  Education details: HEP update Person educated: Patient Education method: Explanation, Demonstration, Tactile cues, Verbal cues, and Handouts Education comprehension: verbalized understanding, returned demonstration, verbal cues required, tactile cues required, and needs further education  HOME EXERCISE PROGRAM: Access Code: [email or web address]    ASSESSMENT: CLINICAL IMPRESSION: Patient tolerated therapy well with no adverse effects. Therapy continues to focus on improving her left ankle mobility and progressing strength. She was able to tolerate more banded ankle strengthening this visit and provided her with a new way to perform ankle eversion  strengthening in seated which she tolerated well. She did exhibit a slight improvement in her ankle strength this visit but remains limited with her ankle mobility and strength that is likely contributing to pain with extended periods of weight bearing. She does report improvement in her symptoms following therapy. Patient would benefit from continued skilled PT to progress mobility and strength in order to reduce pain and maximize functional ability.   Eval: Patient is a 53 y.o. female who was seen today for physical therapy evaluation and treatment for chronic left ankle pain, right knee pain, and left hand pain. Evaluation focused primarily on left ankle and right knee due to time restraint. She demonstrates limitations in both her left ankle and knee mobility, as well as strength deficits of the left ankle and right knee. She does also exhibit gait deviations and mobility deficit with her sit to stands. Will further assess her left wrist/hand as indicated in future session.   OBJECTIVE IMPAIRMENTS: Abnormal gait, decreased activity tolerance, decreased ROM, decreased strength, impaired flexibility, and pain.   ACTIVITY LIMITATIONS: standing, squatting, stairs, transfers, and locomotion level  PARTICIPATION LIMITATIONS: cleaning, shopping, community activity, and yard work  PERSONAL FACTORS: Fitness, Past/current experiences, and Time since onset of injury/illness/exacerbation are also affecting patient's functional outcome.    GOALS: Goals reviewed with patient? Yes  SHORT TERM GOALS: Target date: 07/20/2024  Patient will be I with initial HEP in order to progress with therapy. Baseline: HEP provided at eval 07/24/2024: independent with initial HEP Goal status: MET  2.  Patient will report left ankle pain </= 4/10 with walking in order to reduce functional limitations Baseline: 7/10 07/23/2024: continues to report left ankle pain with walking Goal status: ONGOING  3.  Patient will  demonstrate left ankle DF >/= 8 deg to improve gait and reduce ankle pain Baseline: 0 deg 07/23/2024: continued limitation with ankle mobility Goal status: ONGOING  LONG TERM GOALS: Target date: 08/17/2024  Patient will be I with final HEP to maintain progress from PT. Baseline: HEP provided at eval Goal status: INITIAL  2.  Patient will report PSFS >/= 6 in order to indicate improvement in their functional ability. Baseline: 2.3 Goal status: INITIAL  3.  Patient will demonstrate left ankle strength >/= 4+/5 MMT in order  to improve her walking and activity tolerance Baseline: see limitations above Goal status: INITIAL  4.  Patient will perform 5xSTS </= 9 sec in order to indicate improvement in her LE strength and functional mobility Baseline: 13 seconds Goal status: INITIAL    PLAN: PT FREQUENCY: 1x/week  PT DURATION: 8 weeks  PLANNED INTERVENTIONS: 97164- PT Re-evaluation, 97750- Physical Performance Testing, 97110-Therapeutic exercises, 97530- Therapeutic activity, 97112- Neuromuscular re-education, 97535- Self Care, 02859- Manual therapy, [phone]- Gait training, Patient/Family education, Balance training, Stair training, Taping, Joint mobilization, Joint manipulation, Cryotherapy, and Moist heat  PLAN FOR NEXT SESSION: Review HEP and progress PRN, manual for left ankle and right knee, progress left ankle strength and general LE and hip strengthening, gait and balance training, further assess left wrist/hand as needed   Elaine Daring, PT, DPT, LAT, ATC 07/24/24  11:05 AM Phone: [phone] Fax: [phone]

## 2024-07-23 NOTE — Patient Instructions (Signed)
 Access Code: [email or web address] URL: [email or web address] Date: 07/23/2024 Prepared by: Elaine Daring  Exercises - Long Sitting Ankle Plantar Flexion with Resistance  - 1 x daily - 2 sets - 15 reps - Long Sitting Ankle Eversion with Resistance  - 1 x daily - 2 sets - 15 reps - Seated Ankle Inversion with Resistance and Legs Crossed  - 1 x daily - 2 sets - 15 reps - Seated Ankle Dorsiflexion with Resistance  - 1 x daily - 2 sets - 15 reps - Standing Gastroc Stretch at Counter  - 1 x daily - 3 reps - 20 seconds hold - Sit to Stand Without Arm Support  - 1 x daily - 3 sets - 5 reps - Seated Long Arc Quad  - 20 reps - Seated Heel Toe Raises  - 20 reps - Ankle Inversion Eversion Towel Slide  - 20 reps - Seated Ankle Eversion with Resistance  - 1 x daily - 3 sets - 15 reps

## 2024-07-24 ENCOUNTER — Other Ambulatory Visit: Payer: Self-pay

## 2024-07-24 ENCOUNTER — Encounter: Payer: Self-pay | Admitting: Physical Therapy

## 2024-07-27 ENCOUNTER — Ambulatory Visit: Payer: PRIVATE HEALTH INSURANCE | Admitting: Physical Therapy

## 2024-07-27 ENCOUNTER — Encounter: Payer: Self-pay | Admitting: Physical Therapy

## 2024-07-27 ENCOUNTER — Other Ambulatory Visit: Payer: Self-pay

## 2024-07-27 DIAGNOSIS — M25561 Pain in right knee: Secondary | ICD-10-CM

## 2024-07-27 DIAGNOSIS — M79642 Pain in left hand: Secondary | ICD-10-CM

## 2024-07-27 DIAGNOSIS — M6281 Muscle weakness (generalized): Secondary | ICD-10-CM

## 2024-07-27 DIAGNOSIS — G8929 Other chronic pain: Secondary | ICD-10-CM

## 2024-07-27 DIAGNOSIS — M25572 Pain in left ankle and joints of left foot: Secondary | ICD-10-CM

## 2024-07-27 NOTE — Therapy (Signed)
 OUTPATIENT PHYSICAL THERAPY TREATMENT   Patient Name: Laurie Monroe MRN: [id number] DOB:07-14-1971, 53 y.o., female Today's Date: 07/27/2024   END OF SESSION:  PT End of Session - 07/27/24 1605     Visit Number 4    Number of Visits 9    Date for Recertification  08/17/24    Authorization Type [employer]    PT Start Time 1602    PT Stop Time 1640    PT Time Calculation (min) 38 min    Activity Tolerance Patient tolerated treatment well    Behavior During Therapy WFL for tasks assessed/performed             Past Medical History:  Diagnosis Date   Allergy    Anemia    Chicken pox    Epilepsy (HCC)    Hx of Epilepsy. 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   Tricuspid regurgitation    Past Surgical History:  Procedure Laterality Date   ABDOMINAL HYSTERECTOMY N/A 03/28/2016   Procedure: HYSTERECTOMY ABDOMINAL;  Surgeon: Krystal Deaner, MD;  Location: [facility];  Service: Gynecology;  Laterality: N/A;   BILATERAL SALPINGECTOMY Bilateral 03/28/2016   Procedure: BILATERAL SALPINGECTOMY;  Surgeon: Krystal Deaner, MD;  Location: [facility];  Service: Gynecology;  Laterality: Bilateral;   DILATION AND CURETTAGE OF UTERUS     Heavy Periods   VARICOSE VEIN SURGERY     Patient Active Problem List   Diagnosis Date Noted   Chronic pain of right knee 05/15/2024   Chronic pain of left ankle 05/15/2024   Numbness and tingling in left hand 05/15/2024   Irritable bowel syndrome with diarrhea 05/15/2024   C. 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   S/P abdominal hysterectomy 03/28/2016   Leukocytosis 12/28/2015   Thalassemia trait, alpha 12/28/2015   HTN (hypertension) 11/07/2015   Elevated BP 10/10/2015   GERD (gastroesophageal reflux disease) 10/10/2015   Chest pain 10/10/2015   Edema 10/10/2015   Lower extremity edema 10/10/2015   Chronic venous insufficiency 04/18/2015   Onychomycosis 09/01/2013   Pain in toe 09/01/2013   Seizures (HCC) 07/08/2012    PCP: Antonio Cyndee Jamee JONELLE, DO  REFERRING PROVIDER: Leonce Katz, DO  REFERRING DIAG: Chronic pain of left ankle; Left carpal tunnel syndrome; Left hand pain; Chronic pain of right knee; Primary osteoarthritis of right knee  THERAPY DIAG:  Pain in left ankle and joints of left foot  Chronic pain of right knee  Pain in left hand  Muscle weakness (generalized)  Rationale for Evaluation and Treatment: Rehabilitation  ONSET DATE: Chronic   SUBJECTIVE:  SUBJECTIVE STATEMENT: Patient reports her left foot and ankle are feeling better because she started the prednisone  yesterday. He right knee hasn't been bothering her and she has not had any pain or tingling in the  left hand in a long   Eval: Patient reports left ankle pain that she feels like started after wearing the wrong shoes to a football game. She states no difference since taking medication. She reports the left ankle gives her the most trouble, it takes her a while to warm up the ankle to be able to walk in the morning or after sitting extended periods of time. She is on her feet most of the day at work. She had imaging that that showed she tore a tendon in the left ankle. She also reports right knee pain that she was told was  arthritis. Sitting for long periods of time and then getting up will aggravate the right knee. She also reports left hand numbness that will last until she shakes the hand. The left hand hasn't been bothering as long as the knee or ankle. The numbness will come and go. Patient is right handed.   PERTINENT HISTORY: See PMH above  PAIN:  Are you having pain? Yes:  NPRS scale: 3/10 with walking Pain location: Left ankle Pain description: asleep Aggravating factors: Walking Relieving factors: Medication (prednisone )  NPRS scale: 1/10 with walking Pain location: Right knee Pain description: Stiff, achy  Aggravating factors: Getting up after sitting for a while Relieving factors: Medication (tylenol )   NPRS scale: 0/10 currently Pain location: Left hand Pain description: Throbbing, numbness Aggravating factors: Nothing specific, pain comes and goes Relieving factors: Shaking the hand  PRECAUTIONS: None  PATIENT GOALS: Pain relief and improve walking   OBJECTIVE:  Note: Objective measures were completed at Evaluation unless otherwise noted. PATIENT SURVEYS:  PSFS: 2.3 Crossing my right leg to put on ted hose: 5 Running if there is an emergency: 2 Getting on my knees to pray and getting up without holding on: 0  EDEMA:  Not formally measured but she does exhibit bilateral ankle edema that seems worse on the left  MUSCLE LENGTH: Left calf flexibility deficit  POSTURE:   Right knee varus alignment  PALPATION: Tenderness noted generally about the left ankle, peripatellar region and medial joint line  LOWER EXTREMITY ROM:  Active ROM Right eval Left eval Left 07/16/2024  Hip flexion     Hip extension     Hip abduction     Hip adduction     Hip internal rotation     Hip external rotation     Knee flexion 104 95   Knee extension     Ankle dorsiflexion 10 0 0  Ankle plantarflexion 32 34   Ankle inversion 38 24   Ankle eversion 18 7    (Blank rows = not  tested)  LOWER EXTREMITY MMT:  MMT Right eval Left eval Left 07/24/2024  Hip flexion     Hip extension     Hip abduction     Hip adduction     Hip internal rotation     Hip external rotation     Knee flexion 4+ 5   Knee extension 4 5   Ankle dorsiflexion 5 4+ 4+  Ankle plantarflexion 5 3   Ankle inversion 5 3+ 4-  Ankle eversion 5 4- 4-   (Blank rows = not tested)  FUNCTIONAL TESTS:  5 times sit to stand: 13 seconds  GAIT: Assistive device utilized: None Level of assistance: Complete Independence Comments: Antalgia and trendelenburg bilaterally but primarily on right  TREATMENT [facility] Adult PT Treatment:                                                DATE: 07/27/2024 Slant board calf stretch 3 x 20 sec Longsitting 4-way ankle with red 2 x 15 each Seated ankle eversion with red loop around mid foot x 10 each position Standing heel toe raises 3 x 10 Tandem stance 3 x 20 sec each LAQ with 4# 2 x 10 each Standing hip abduction with 4 3 x 10 each Sit to stand 3 x 10  PATIENT EDUCATION:  Education details: HEP Person educated: Patient Education method: Programmer, Multimedia, Demonstration, Tactile cues, Verbal cues Education comprehension: verbalized understanding, returned demonstration, verbal cues required, tactile cues required, and needs further education  HOME EXERCISE PROGRAM: Access Code: [email or web address]    ASSESSMENT: CLINICAL IMPRESSION: Patient tolerated therapy well with no adverse effects. She arrives reporting improvement in pain from beginning her prednisone  medication. Therapy focused on progressing her ankle mobility, strength, and control, as well as incorporating more knee and hip strengthening with good tolerance. She was able to tolerate much more weight bearing and strengthening exercises this visit and did not report any increase in her left  ankle pain. No changes made to her HEP since she will be going out of town the next week. Patient would benefit from continued skilled PT to progress mobility and strength in order to reduce pain and maximize functional ability.   Eval: Patient is a 53 y.o. female who was seen today for physical therapy evaluation and treatment for chronic left ankle pain, right knee pain, and left hand pain. Evaluation focused primarily on left ankle and right knee due to time restraint. She demonstrates limitations in both her left ankle and knee mobility, as well as strength deficits of the left ankle and right knee. She does also exhibit gait deviations and mobility deficit with her sit to stands. Will further assess her left wrist/hand as indicated in future session.   OBJECTIVE IMPAIRMENTS: Abnormal gait, decreased activity tolerance, decreased ROM, decreased strength, impaired flexibility, and pain.   ACTIVITY LIMITATIONS: standing, squatting, stairs, transfers, and locomotion level  PARTICIPATION LIMITATIONS: cleaning, shopping, community activity, and yard work  PERSONAL FACTORS: Fitness, Past/current experiences, and Time since onset of injury/illness/exacerbation are also affecting patient's functional outcome.    GOALS: Goals reviewed with patient? Yes  SHORT TERM GOALS: Target date: 07/20/2024  Patient will be I with initial HEP in order to progress with therapy. Baseline: HEP provided at eval 07/24/2024: independent with initial HEP Goal status: MET  2.  Patient will report left ankle pain </= 4/10 with walking in order to reduce functional limitations Baseline: 7/10 07/23/2024: continues to report left ankle pain with walking Goal status: ONGOING  3.  Patient will demonstrate left ankle DF >/= 8 deg to improve gait and reduce ankle pain Baseline: 0 deg 07/23/2024: continued limitation with ankle mobility Goal status: ONGOING  LONG TERM GOALS: Target date: 08/17/2024  Patient will be  I with final HEP to maintain progress from PT. Baseline: HEP provided at eval Goal status: INITIAL  2.  Patient will report PSFS >/= 6 in order to indicate improvement in their functional ability. Baseline: 2.3 Goal status: INITIAL  3.  Patient will demonstrate left ankle strength >/= 4+/5 MMT in order to improve her walking and activity tolerance  Baseline: see limitations above Goal status: INITIAL  4.  Patient will perform 5xSTS </= 9 sec in order to indicate improvement in her LE strength and functional mobility Baseline: 13 seconds Goal status: INITIAL    PLAN: PT FREQUENCY: 1x/week  PT DURATION: 8 weeks  PLANNED INTERVENTIONS: 97164- PT Re-evaluation, 97750- Physical Performance Testing, 97110-Therapeutic exercises, 97530- Therapeutic activity, 97112- Neuromuscular re-education, 97535- Self Care, 02859- Manual therapy, [phone]- Gait training, Patient/Family education, Balance training, Stair training, Taping, Joint mobilization, Joint manipulation, Cryotherapy, and Moist heat  PLAN FOR NEXT SESSION: Review HEP and progress PRN, manual for left ankle and right knee, progress left ankle strength and general LE and hip strengthening, gait and balance training, further assess left wrist/hand as needed   Elaine Daring, PT, DPT, LAT, ATC 07/27/24  4:40 PM Phone: [phone] Fax: [phone]

## 2024-07-27 NOTE — Progress Notes (Deleted)
    [facility] [address] [place] [address] Phone: [phone]   Assessment and Plan:     ***    Pertinent previous records reviewed include ***   Follow Up: ***     Subjective:   I, Mads Borgmeyer, am serving as a neurosurgeon for Doctor Morene Mace   Chief Complaint: left ankle and left hand pain    HPI:    05/27/2024 Patient is a 53 year old female with left ankle and hand pain. Patient states left ankle pain started last year. Hx of torn tendon in left ankle. No surgery recommended. She still has pain. Tylenol  for the pain and that helps but not enough. Total ankle pain. Antalgic gait. No radiating pain.    Left hand pain intermittent numbness and tingling total hand. This sensation only last a couple of seconds. Grip strength intact. ROM WNL    Right knee pain for a while. Feels like its going to give out. Dx of arthritis when she saw PCP. No radiating pain. Pain located to patellar tendon   06/24/2024 Patient states she is okay. Just started PT Monday   08/05/2024 Patient states   Relevant Historical Information: Pulmonary hypertension, GERD, elevated BMI    Additional pertinent review of systems negative.   Current Outpatient Medications:    albuterol  (VENTOLIN  HFA) 108 (90 Base) MCG/ACT inhaler, INHALE 2 PUFFS EVERY 6 HOURS AS NEEDED FOR WHEEZING OR SHORTNESS OF BREATH, Disp: 8.5 g, Rfl: 1   celecoxib  (CELEBREX ) 200 MG capsule, Take 1 capsule (200 mg total) by mouth 2 (two) times daily., Disp: 60 capsule, Rfl: 0   cetirizine  (ZYRTEC ) 10 MG tablet, TAKE 1 TABLET(10 MG) BY MOUTH DAILY, Disp: 90 tablet, Rfl: 1   dicyclomine  (BENTYL ) 10 MG capsule, Take 1 capsule (10 mg total) by mouth 4 (four) times daily -  before meals and at bedtime., Disp: 45 capsule, Rfl: 1   furosemide  (LASIX ) 40 MG tablet, TAKE 1 TABLET(40 MG) BY MOUTH DAILY, Disp: 90 tablet, Rfl: 1   methylPREDNISolone  (MEDROL  DOSEPAK) 4 MG TBPK  tablet, Take 6 tablets on day 1.  Take 5 tablets on day 2.  Take 4 tablets on day 3.  Take 3 tablets on day 4.  Take 2 tablets on day 5.  Take 1 tablet on day 6., Disp: 21 tablet, Rfl: 0   mometasone  (NASONEX ) 50 MCG/ACT nasal spray, Place 2 sprays into the nose at bedtime. As needed., Disp: 17 g, Rfl: 3   montelukast  (SINGULAIR ) 10 MG tablet, Take 1 tablet (10 mg total) by mouth at bedtime., Disp: 90 tablet, Rfl: 1   MULTIPLE VITAMIN PO, Take 1 tablet by mouth daily. , Disp: , Rfl:    predniSONE  (DELTASONE ) 10 MG tablet, TAKE 3 TABLETS PO QD FOR 3 DAYS THEN TAKE 2 TABLETS PO QD FOR 3 DAYS THEN TAKE 1 TABLET PO QD FOR 3 DAYS THEN TAKE 1/2 TAB PO QD FOR 3 DAYS, Disp: 20 tablet, Rfl: 0   Vitamin D , Ergocalciferol , (DRISDOL ) 1.25 MG (50000 UNIT) CAPS capsule, Take 1 capsule (50,000 Units total) by mouth every 7 (seven) days., Disp: 12 capsule, Rfl: 1   Objective:     There were no vitals filed for this visit.    There is no height or weight on file to calculate BMI.    Physical Exam:    ***   Electronically signed by:  Odis Mace D.CLEMENTEEN [facility] 7:35 AM 07/27/24

## 2024-07-28 ENCOUNTER — Ambulatory Visit (INDEPENDENT_AMBULATORY_CARE_PROVIDER_SITE_OTHER): Payer: PRIVATE HEALTH INSURANCE | Admitting: Sports Medicine

## 2024-07-28 ENCOUNTER — Encounter: Payer: Self-pay | Admitting: Family

## 2024-07-28 VITALS — HR 76 | Ht 66.0 in | Wt 286.0 lb

## 2024-07-28 DIAGNOSIS — M25572 Pain in left ankle and joints of left foot: Secondary | ICD-10-CM | POA: Diagnosis not present

## 2024-07-28 DIAGNOSIS — M76822 Posterior tibial tendinitis, left leg: Secondary | ICD-10-CM

## 2024-07-28 DIAGNOSIS — M7672 Peroneal tendinitis, left leg: Secondary | ICD-10-CM

## 2024-07-28 DIAGNOSIS — G8929 Other chronic pain: Secondary | ICD-10-CM

## 2024-07-28 NOTE — Patient Instructions (Signed)
 Let us  know how you would like to proceed  -Finish prednisone  course  -Start PT   -Continue HEP   -After finishing prednisone   - Celebrex  200 mg 2x daily x2 week.  If still having pain after 2 weeks, complete 3rd-week of NSAID. May use remaining NSAID as needed once daily for pain control.  Do not to use additional over-the-counter NSAIDs (ibuprofen , naproxen, Advil , Aleve, etc.) while taking prescription NSAIDs.  May use Tylenol  [phone] mg 2 to 3 times a day for breakthrough pain.  Call for refill if needed   Recommend using a boot continuously up to 6 weeks   Can drop of paperwork for work accomodation  if you decide to use boot  As needed follow up if you want steroid injections

## 2024-07-28 NOTE — Progress Notes (Signed)
 Ben Chou Busler D.CLEMENTEEN [name] [address] [place] [address] Phone: [phone]   Assessment and Plan:     1. Chronic pain of left ankle (Primary) 2. Tibialis posterior tendinitis, left 3. Peroneal tendinitis, left -Chronic with exacerbation, subsequent visit - Continued ankle pain consistent with multiple findings seen on left ankle MRI 08/13/2023 including peroneal tendinitis, posterior tibialis tendinitis - Patient interested in repeat MRI.  We discussed that without new trauma or new symptoms, there would be no need to repeat MRI at this time.  - Discussed multiple options for conservative treatment including the following:  - Continue HEP - Start physical therapy - Complete prednisone  course -After completing prednisone  course, start Celebrex  200 mg twice daily x2 weeks.  If still having pain after 2 weeks, complete 3rd-week of NSAID. May use remaining NSAID as needed once daily for pain control.  Do not to use additional over-the-counter NSAIDs (ibuprofen , naproxen, Advil , Aleve, etc.) while taking prescription NSAIDs.  May use Tylenol  [phone] mg 2 to 3 times a day for breakthrough pain.  Patient can call for refill if needed -Recommend using boot continuously for 6 weeks.  Would provide work note if patient needs that patient should wear boot continuously at work - Could follow-up in clinic for peroneal tendon +/- posterior tibialis tendon CSI    Pertinent previous records reviewed include   - MRI 08/13/2023 shows impression: 1.  Split tear with moderate tendinosis and tenosynovitis of tibialis posterior. 2.  Mild tenosynovitis of peroneus longus and flexor digitorum longus. 3.  Attenuated ATFL, likely related to remote injury. 4.  Findings suggesting mild chronic plantar fasciitis.   Follow Up: Recommend patient reach back out to our clinic once she determines which treatment option she would like to pursue.   Subjective:   I,  Moenique Maybell, am serving as a neurosurgeon for Doctor Morene Mace   Chief Complaint: left ankle and left hand pain    HPI:    05/27/2024 Patient is a 53 year old female with left ankle and hand pain. Patient states left ankle pain started last year. Hx of torn tendon in left ankle. No surgery recommended. 53 still has pain. Tylenol  for the pain and that helps but not enough. Total ankle pain. Antalgic gait. No radiating pain.    Left hand pain intermittent numbness and tingling total hand. This sensation only last a couple of seconds. Grip strength intact. ROM WNL    Right knee pain for a while. Feels like its going to give out. Dx of arthritis when she saw PCP. No radiating pain. Pain located to patellar tendon   06/24/2024 Patient states she is okay. Just started PT Monday    07/28/2024 Patient states prednisone  has been helping. Wants to get an opinion on MRI   Relevant Historical Information: Pulmonary hypertension, GERD, elevated BMI  Additional pertinent review of systems negative.   Current Outpatient Medications:    albuterol  (VENTOLIN  HFA) 108 (90 Base) MCG/ACT inhaler, INHALE 2 PUFFS EVERY 6 HOURS AS NEEDED FOR WHEEZING OR SHORTNESS OF BREATH, Disp: 8.5 g, Rfl: 1   celecoxib  (CELEBREX ) 200 MG capsule, Take 1 capsule (200 mg total) by mouth 2 (two) times daily., Disp: 60 capsule, Rfl: 0   cetirizine  (ZYRTEC ) 10 MG tablet, TAKE 1 TABLET(10 MG) BY MOUTH DAILY, Disp: 90 tablet, Rfl: 1   dicyclomine  (BENTYL ) 10 MG capsule, Take 1 capsule (10 mg total) by mouth 4 (four) times daily -  before meals and  at bedtime., Disp: 45 capsule, Rfl: 1   furosemide  (LASIX ) 40 MG tablet, TAKE 1 TABLET(40 MG) BY MOUTH DAILY, Disp: 90 tablet, Rfl: 1   methylPREDNISolone  (MEDROL  DOSEPAK) 4 MG TBPK tablet, Take 6 tablets on day 1.  Take 5 tablets on day 2.  Take 4 tablets on day 3.  Take 3 tablets on day 4.  Take 2 tablets on day 5.  Take 1 tablet on day 6., Disp: 21 tablet, Rfl: 0   mometasone   (NASONEX ) 50 MCG/ACT nasal spray, Place 2 sprays into the nose at bedtime. As needed., Disp: 17 g, Rfl: 3   montelukast  (SINGULAIR ) 10 MG tablet, Take 1 tablet (10 mg total) by mouth at bedtime., Disp: 90 tablet, Rfl: 1   MULTIPLE VITAMIN PO, Take 1 tablet by mouth daily. , Disp: , Rfl:    predniSONE  (DELTASONE ) 10 MG tablet, TAKE 3 TABLETS PO QD FOR 3 DAYS THEN TAKE 2 TABLETS PO QD FOR 3 DAYS THEN TAKE 1 TABLET PO QD FOR 3 DAYS THEN TAKE 1/2 TAB PO QD FOR 3 DAYS, Disp: 20 tablet, Rfl: 0   Vitamin D , Ergocalciferol , (DRISDOL ) 1.25 MG (50000 UNIT) CAPS capsule, Take 1 capsule (50,000 Units total) by mouth every 7 (seven) days., Disp: 12 capsule, Rfl: 1   Objective:     Vitals:   07/28/24 1352  Pulse: 76  SpO2: 96%  Weight: 286 lb (129.7 kg)  Height: 5' 6 (1.676 m)      Body mass index is 46.16 kg/m.    Physical Exam:    General: Well-appearing, cooperative, sitting comfortably in no acute distress.  HEENT: Normocephalic, atraumatic.   Neck: No gross abnormality.  Cardiovascular: No pallor or cyanosis. Resp: Comfortable WOB.   Abdomen: Non distended.   Skin: Warm and dry; no focal rashes identified on limited exam. Extremities: No cyanosis or edema.  TTP left deltoid along medial ankle, left medial malleolus, right medial knee joint line Neuro: Gross motor and sensory intact. Gait normal. Psychiatric: Mood and affect are appropriate.      Electronically signed by:  Odis Mace D.[name] 2:56 PM 07/28/24

## 2024-08-03 ENCOUNTER — Encounter: Payer: Self-pay | Admitting: Physical Therapy

## 2024-08-03 ENCOUNTER — Ambulatory Visit (INDEPENDENT_AMBULATORY_CARE_PROVIDER_SITE_OTHER): Payer: PRIVATE HEALTH INSURANCE | Admitting: Physical Therapy

## 2024-08-03 ENCOUNTER — Encounter: Payer: Self-pay | Admitting: Family

## 2024-08-03 ENCOUNTER — Other Ambulatory Visit: Payer: Self-pay

## 2024-08-03 ENCOUNTER — Other Ambulatory Visit: Payer: Self-pay | Admitting: Sports Medicine

## 2024-08-03 DIAGNOSIS — M6281 Muscle weakness (generalized): Secondary | ICD-10-CM

## 2024-08-03 DIAGNOSIS — M79642 Pain in left hand: Secondary | ICD-10-CM

## 2024-08-03 DIAGNOSIS — G8929 Other chronic pain: Secondary | ICD-10-CM

## 2024-08-03 DIAGNOSIS — M25572 Pain in left ankle and joints of left foot: Secondary | ICD-10-CM | POA: Diagnosis not present

## 2024-08-03 DIAGNOSIS — M25561 Pain in right knee: Secondary | ICD-10-CM

## 2024-08-03 NOTE — Patient Instructions (Signed)
 Access Code: [email or web address] URL: [email or web address] Date: 08/03/2024 Prepared by: Elaine Daring  Exercises - Long Sitting Ankle Plantar Flexion with Resistance  - 1 x daily - 2 sets - 15 reps - Long Sitting Ankle Eversion with Resistance  - 1 x daily - 2 sets - 15 reps - Seated Ankle Inversion with Resistance and Legs Crossed  - 1 x daily - 2 sets - 15 reps - Seated Ankle Dorsiflexion with Resistance  - 1 x daily - 2 sets - 15 reps - Standing Gastroc Stretch at Counter  - 1 x daily - 3 reps - 20 seconds hold - Sit to Stand Without Arm Support  - 1 x daily - 3 sets - 5 reps - Seated Long Arc Quad  - 20 reps - Seated Heel Toe Raises  - 20 reps - Ankle Inversion Eversion Towel Slide  - 20 reps - Seated Ankle Eversion with Resistance  - 1 x daily - 3 sets - 15 reps - Heel Toe Raises with Counter Support  - 1 x daily - 3 sets - 10 reps

## 2024-08-03 NOTE — Therapy (Addendum)
 " OUTPATIENT PHYSICAL THERAPY TREATMENT  DISCHARGE   Patient Name: Laurie Monroe MRN: [id number] DOB:[birth year], 53 y.o., female Today's Date: 08/03/2024   END OF SESSION:  PT End of Session - 08/03/24 1611     Visit Number 5    Number of Visits 9    Date for Recertification  08/17/24    Authorization Type [employer]    PT Start Time 1607    PT Stop Time 1645    PT Time Calculation (min) 38 min    Activity Tolerance Patient tolerated treatment well    Behavior During Therapy WFL for tasks assessed/performed              Past Medical History:  Diagnosis Date   Allergy    Anemia    Chicken pox    Epilepsy (HCC)    Hx of Epilepsy. 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   Tricuspid regurgitation    Past Surgical History:  Procedure Laterality Date   ABDOMINAL HYSTERECTOMY N/A 03/28/2016   Procedure: HYSTERECTOMY ABDOMINAL;  Surgeon: Krystal Deaner, MD;  Location: [facility];  Service: Gynecology;  Laterality: N/A;   BILATERAL SALPINGECTOMY Bilateral 03/28/2016   Procedure: BILATERAL SALPINGECTOMY;  Surgeon: Krystal Deaner, MD;  Location: [facility];  Service: Gynecology;  Laterality: Bilateral;   DILATION AND CURETTAGE OF UTERUS     Heavy Periods   VARICOSE VEIN SURGERY     Patient Active Problem List   Diagnosis Date Noted   Chronic pain of right knee 05/15/2024   Chronic pain of left ankle 05/15/2024   Numbness and tingling in left hand 05/15/2024   Irritable bowel syndrome with diarrhea 05/15/2024   C. difficile diarrhea 09/10/2023   Vitamin D  deficiency 05/15/2023   Pain in both lower extremities 05/15/2023   Mild intermittent asthma with acute exacerbation 05/15/2023   Preventative health care 01/09/2022   Myalgia 12/21/2021   Post-viral cough syndrome 04/25/2021   Cough 10/30/2020   Bilateral edema of lower extremity  10/30/2020   Morbid obesity (HCC) 10/30/2020   Reactive airway disease 08/28/2018   Viral upper respiratory tract infection 06/26/2017   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   Pulmonary hypertension (HCC) 03/24/2017   Iron deficiency anemia 07/13/2016   Hemoglobin C trait 07/05/2016   S/P abdominal hysterectomy 03/28/2016   Leukocytosis 12/28/2015   Thalassemia trait, alpha 12/28/2015   HTN (hypertension) 11/07/2015   Elevated BP 10/10/2015   GERD (gastroesophageal reflux disease) 10/10/2015   Chest pain 10/10/2015   Edema 10/10/2015   Lower extremity edema 10/10/2015   Chronic venous insufficiency 04/18/2015   Onychomycosis 09/01/2013   Pain in toe 09/01/2013   Seizures (HCC) 07/08/2012    PCP: Antonio Cyndee Jamee JONELLE, DO  REFERRING PROVIDER: Leonce Katz, DO  REFERRING DIAG: Chronic pain of left ankle; Left carpal tunnel syndrome; Left hand pain; Chronic pain of right knee; Primary osteoarthritis of right knee  THERAPY DIAG:  Pain in left ankle and joints of left foot  Chronic pain of right knee  Pain in left hand  Muscle weakness (generalized)  Rationale for Evaluation and Treatment: Rehabilitation  ONSET DATE: Chronic   SUBJECTIVE:  SUBJECTIVE STATEMENT: Patient reports she finished up the prednisone  on Friday and her left ankle is so-so.    Eval: Patient reports left ankle pain that she feels like started after  wearing the wrong shoes to a football game. She states no difference since taking medication. She reports the left ankle gives her the most trouble, it takes her a while to warm up the ankle to be able to walk in the morning or after sitting extended periods of time. She is on her feet most of the day at work. She had imaging that that showed she tore a tendon in the left ankle. She also reports right knee pain that she was told was arthritis. Sitting for long periods of time and then getting up will aggravate the right knee. She also  reports left hand numbness that will last until she shakes the hand. The left hand hasn't been bothering as long as the knee or ankle. The numbness will come and go. Patient is right handed.   PERTINENT HISTORY: See PMH above  PAIN:  Are you having pain? Yes:  NPRS scale: 6/10 with walking Pain location: Left ankle Pain description: asleep Aggravating factors: Walking Relieving factors: Medication (prednisone )  NPRS scale: 1/10 with walking Pain location: Right knee Pain description: Stiff, achy  Aggravating factors: Getting up after sitting for a while Relieving factors: Medication (tylenol )   NPRS scale: 0/10 currently Pain location: Left hand Pain description: Throbbing, numbness Aggravating factors: Nothing specific, pain comes and goes Relieving factors: Shaking the hand  PRECAUTIONS: None  PATIENT GOALS: Pain relief and improve walking   OBJECTIVE:  Note: Objective measures were completed at Evaluation unless otherwise noted. PATIENT SURVEYS:  PSFS: 2.3 Crossing my right leg to put on ted hose: 5 Running if there is an emergency: 2 Getting on my knees to pray and getting up without holding on: 0  EDEMA:  Not formally measured but she does exhibit bilateral ankle edema that seems worse on the left  MUSCLE LENGTH: Left calf flexibility deficit  POSTURE:   Right knee varus alignment  PALPATION: Tenderness noted generally about the left ankle, peripatellar region and medial joint line  LOWER EXTREMITY ROM:  Active ROM Right eval Left eval Left 07/16/2024  Hip flexion     Hip extension     Hip abduction     Hip adduction     Hip internal rotation     Hip external rotation     Knee flexion 104 95   Knee extension     Ankle dorsiflexion 10 0 0  Ankle plantarflexion 32 34   Ankle inversion 38 24   Ankle eversion 18 7    (Blank rows = not tested)  LOWER EXTREMITY MMT:  MMT Right eval Left eval Left 07/24/2024  Hip flexion     Hip extension      Hip abduction     Hip adduction     Hip internal rotation     Hip external rotation     Knee flexion 4+ 5   Knee extension 4 5   Ankle dorsiflexion 5 4+ 4+  Ankle plantarflexion 5 3   Ankle inversion 5 3+ 4-  Ankle eversion 5 4- 4-   (Blank rows = not tested)  FUNCTIONAL TESTS:  5 times sit to stand: 13 seconds  GAIT: Assistive device utilized: None Level of assistance: Complete Independence Comments: Antalgia and trendelenburg bilaterally but primarily on right  TREATMENT [facility] Adult PT Treatment:                                                DATE: 08/03/2024 Slant board calf stretch 3 x 30 sec Standing heel toe raises 3 x 10 Seated heel raise with 15# 3 x 10 Longsitting ankle inversion and eversion with red 3 x 10 each Tandem stance 3 x 30 sec each LAQ with 5# 3 x 10 each Seated rockerboard fwd/bwd and lateral taps 2 x 20 each  PATIENT EDUCATION:  Education details: HEP update Person educated: Patient Education method: Explanation, Demonstration, Tactile cues, Verbal cues, Handout Education comprehension: verbalized understanding, returned demonstration, verbal cues required, tactile cues required, and needs further education  HOME EXERCISE PROGRAM: Access Code: [email or web address]    ASSESSMENT: CLINICAL IMPRESSION: Patient tolerated therapy well with no adverse effects. Therapy continues to focus primarily on improve left ankle mobility, strength, and stability with good tolerance. She did report increase in pain since last visit as she has finished her medications. She was able to tolerate continued strengthening and balance exercises in weight bearing position. Incorporated more ankle control exercises and also continued with knee strengthening. Updated her HEP to progress weight bearing ankle strengthening. Patient would benefit from continued skilled  PT to progress mobility and strength in order to reduce pain and maximize functional ability.   Eval: Patient is a 53 y.o. female who was seen today for physical therapy evaluation and treatment for chronic left ankle pain, right knee pain, and left hand pain. Evaluation focused primarily on left ankle and right knee due to time restraint. She demonstrates limitations in both her left ankle and knee mobility, as well as strength deficits of the left ankle and right knee. She does also exhibit gait deviations and mobility deficit with her sit to stands. Will further assess her left wrist/hand as indicated in future session.   OBJECTIVE IMPAIRMENTS: Abnormal gait, decreased activity tolerance, decreased ROM, decreased strength, impaired flexibility, and pain.   ACTIVITY LIMITATIONS: standing, squatting, stairs, transfers, and locomotion level  PARTICIPATION LIMITATIONS: cleaning, shopping, community activity, and yard work  PERSONAL FACTORS: Fitness, Past/current experiences, and Time since onset of injury/illness/exacerbation are also affecting patient's functional outcome.    GOALS: Goals reviewed with patient? Yes  SHORT TERM GOALS: Target date: 07/20/2024  Patient will be I with initial HEP in order to progress with therapy. Baseline: HEP provided at eval 07/24/2024: independent with initial HEP Goal status: MET  2.  Patient will report left ankle pain </= 4/10 with walking in order to reduce functional limitations Baseline: 7/10 07/23/2024: continues to report left ankle pain with walking Goal status: ONGOING  3.  Patient will demonstrate left ankle DF >/= 8 deg to improve gait and reduce ankle pain Baseline: 0 deg 07/23/2024: continued limitation with ankle mobility Goal status: ONGOING  LONG TERM GOALS: Target date: 08/17/2024  Patient will be I with final HEP to maintain progress from PT. Baseline: HEP provided at eval Goal status: INITIAL  2.  Patient will report PSFS  >/= 6 in order to indicate improvement in their functional ability. Baseline: 2.3 Goal status: INITIAL  3.  Patient will demonstrate left ankle strength >/= 4+/5 MMT in order to improve her walking and activity tolerance Baseline: see limitations above Goal status: INITIAL  4.  Patient will perform 5xSTS </= 9  sec in order to indicate improvement in her LE strength and functional mobility Baseline: 13 seconds Goal status: INITIAL    PLAN: PT FREQUENCY: 1x/week  PT DURATION: 8 weeks  PLANNED INTERVENTIONS: 97164- PT Re-evaluation, 97750- Physical Performance Testing, 97110-Therapeutic exercises, 97530- Therapeutic activity, 97112- Neuromuscular re-education, 97535- Self Care, [id number]- Manual therapy, [phone]- Gait training, Patient/Family education, Balance training, Stair training, Taping, Joint mobilization, Joint manipulation, Cryotherapy, and Moist heat  PLAN FOR NEXT SESSION: Review HEP and progress PRN, manual for left ankle and right knee, progress left ankle strength and general LE and hip strengthening, gait and balance training, further assess left wrist/hand as needed   Elaine Daring, PT, DPT, LAT, ATC 08/03/24  4:51 PM Phone: [phone] Fax: [phone]   PHYSICAL THERAPY DISCHARGE SUMMARY  Visits from Start of Care: 5  Current functional level related to goals / functional outcomes: See above   Remaining deficits: See above   Education / Equipment: HEP   Patient agrees to discharge. Patient goals were not met. Patient is being discharged due to not returning since the last visit.  Elaine Daring, PT, DPT, LAT, ATC 09/16/2024  2:45 PM Phone: [phone] Fax: [phone]   "

## 2024-08-05 ENCOUNTER — Ambulatory Visit: Payer: Self-pay | Admitting: Sports Medicine

## 2024-08-17 NOTE — Progress Notes (Unsigned)
 Laurie Monroe D.CLEMENTEEN [facility] [address] [place] [address] Phone: [phone]   Assessment and Plan:     1. Chronic pain of left ankle (Primary) 2. Tibialis posterior tendinitis, left 3. Peroneal tendinitis, left 4.  Plantar fasciitis, left -Chronic with exacerbation, subsequent visit - Medial and lateral ankle pain is currently fairly well-controlled with conservative therapy, HEP.  However, patient experiencing foot pain consistent with plantar fasciitis at today's visit.  Likely flared by physical activity, pes planus - Continue HEP and physical therapy - Patient elected for plantar fasciitis CSI.  Tolerated well per note below - Use Celebrex  200 mg daily as needed for breakthrough pain.  Recommend limiting chronic NSAIDs to 1-2 doses per week to prevent long-term side effects. Use Tylenol  500 to 1000 mg tablets 2-3 times a day as needed for day-to-day pain relief.    -Recommend [employer] or other similar store to discuss plantar fasciitis inserts - Patient may be out of work tomorrow, returning to work on Thursday, 08/20/2024  Procedure: Plantar Fascia Origin Injection  Side: Left Diagnosis: Plantar fasciitis   After explaining the procedure, viable alternatives, risks, and answering any questions, consent was given verbally.  We specifically discussed the risk of fat pad atrophy.  The site was cleaned with alcohol prep.  A steroid injection was performed using 1 ml of 1% lidocaine  without epinephrine and 40 mg of Kenalog 40 mg/ml deep to the fascia as to avoid the fat pad. This was well tolerated and resulted in relief.  Needle was removed and dressing placed and post injection instructions were given including a discussion of likely return of pain today after the anesthetic wears off (with the possibility of worsened pain) until the steroid starts to work in 1-3 days.   Pt was advised to call or return to clinic if these symptoms worsen or  fail to improve as anticipated. Images permanently stored.  15 additional minutes spent for educating Therapeutic Home Exercise Program.  This included exercises focusing on stretching, strengthening, with focus on eccentric aspects.   Long term goals include an improvement in range of motion, strength, endurance as well as avoiding reinjury. Patient's frequency would include in 1-2 times a day, 3-5 times a week for a duration of 6-12 weeks. Proper technique shown and discussed handout in great detail with ATC.  All questions were discussed and answered.    Pertinent previous records reviewed include none   Follow Up: As needed for reevaluation.  Could consider ECSWT versus PRP injection versus physical therapy   for plantar fasciitis.  Could consider CSI for posterior tibialis versus peroneal tendinitis if ankle pain returns.  Could consider boot use for 6 weeks to decrease strain which has been recommended in the past, however patient was not able to follow recommendation due to shoe requirements at work   Subjective:   I, Laurie Monroe, am serving as a neurosurgeon for Doctor Morene Mace   Chief Complaint: left ankle and left hand pain    HPI:    05/27/2024 Patient is a 53 year old female with left ankle and hand pain. Patient states left ankle pain started last year. Hx of torn tendon in left ankle. No surgery recommended. She still has pain. Tylenol  for the pain and that helps but not enough. Total ankle pain. Antalgic gait. No radiating pain.    Left hand pain intermittent numbness and tingling total hand. This sensation only last a couple of seconds. Grip strength  intact. ROM WNL    Right knee pain for a while. Feels like its going to give out. Dx of arthritis when she saw PCP. No radiating pain. Pain located to patellar tendon   06/24/2024 Patient states she is okay. Just started PT Monday    07/28/2024 Patient states prednisone  has been helping. Wants to get an opinion on MRI    08/18/2024 Patient states ready for injection     Relevant Historical Information: Pulmonary hypertension, GERD, elevated BMI  Additional pertinent review of systems negative.  Current Medications[1]   Objective:     Vitals:   08/18/24 1527  Pulse: (!) 103  SpO2: 97%  Weight: 286 lb (129.7 kg)  Height: 5' 6 (1.676 m)      Body mass index is 46.16 kg/m.    Physical Exam:    Gen: Appears well, nad, nontoxic and pleasant Psych: Alert and oriented, appropriate mood and affect Neuro: sensation intact, strength is 5/5 with df/pf/inv/ev, muscle tone wnl Skin: no susupicious lesions or rashes  Left foot/ankle:  Pes planus deformity, no swelling or effusion TTP anterior medial calcaneus, plantar fascia insertion, plantar fascia medial band NTTP over fibular head, lat mal, medial mal, achilles, navicular, base of 5th, ATFL, CFL, deltoid, posterior calcaneous or midfoot ROM DF 30, PF 45, inv/ev intact Negative ant drawer, talar tilt, rotation test, squeeze test. Neg thompson No pain with resisted inversion or eversion    Electronically signed by:  Odis Mace D.CLEMENTEEN [facility] 3:45 PM 08/18/2024     [1]  Current Outpatient Medications:    albuterol  (VENTOLIN  HFA) 108 (90 Base) MCG/ACT inhaler, INHALE 2 PUFFS EVERY 6 HOURS AS NEEDED FOR WHEEZING OR SHORTNESS OF BREATH, Disp: 8.5 g, Rfl: 1   celecoxib  (CELEBREX ) 200 MG capsule, TAKE 1 CAPSULE(200 MG) BY MOUTH TWICE DAILY, Disp: 60 capsule, Rfl: 1   cetirizine  (ZYRTEC ) 10 MG tablet, TAKE 1 TABLET(10 MG) BY MOUTH DAILY, Disp: 90 tablet, Rfl: 1   dicyclomine  (BENTYL ) 10 MG capsule, Take 1 capsule (10 mg total) by mouth 4 (four) times daily -  before meals and at bedtime., Disp: 45 capsule, Rfl: 1   furosemide  (LASIX ) 40 MG tablet, TAKE 1 TABLET(40 MG) BY MOUTH DAILY, Disp: 90 tablet, Rfl: 1   methylPREDNISolone  (MEDROL  DOSEPAK) 4 MG TBPK tablet, Take 6 tablets on day 1.  Take 5 tablets on day 2.  Take 4  tablets on day 3.  Take 3 tablets on day 4.  Take 2 tablets on day 5.  Take 1 tablet on day 6., Disp: 21 tablet, Rfl: 0   mometasone  (NASONEX ) 50 MCG/ACT nasal spray, Place 2 sprays into the nose at bedtime. As needed., Disp: 17 g, Rfl: 3   montelukast  (SINGULAIR ) 10 MG tablet, Take 1 tablet (10 mg total) by mouth at bedtime., Disp: 90 tablet, Rfl: 1   MULTIPLE VITAMIN PO, Take 1 tablet by mouth daily. , Disp: , Rfl:    predniSONE  (DELTASONE ) 10 MG tablet, TAKE 3 TABLETS PO QD FOR 3 DAYS THEN TAKE 2 TABLETS PO QD FOR 3 DAYS THEN TAKE 1 TABLET PO QD FOR 3 DAYS THEN TAKE 1/2 TAB PO QD FOR 3 DAYS, Disp: 20 tablet, Rfl: 0   Vitamin D , Ergocalciferol , (DRISDOL ) 1.25 MG (50000 UNIT) CAPS capsule, Take 1 capsule (50,000 Units total) by mouth every 7 (seven) days., Disp: 12 capsule, Rfl: 1

## 2024-08-18 ENCOUNTER — Ambulatory Visit: Payer: PRIVATE HEALTH INSURANCE | Admitting: Sports Medicine

## 2024-08-18 ENCOUNTER — Encounter: Payer: Self-pay | Admitting: Family

## 2024-08-18 ENCOUNTER — Ambulatory Visit: Payer: Self-pay

## 2024-08-18 VITALS — HR 103 | Ht 66.0 in | Wt 286.0 lb

## 2024-08-18 DIAGNOSIS — G8929 Other chronic pain: Secondary | ICD-10-CM

## 2024-08-18 DIAGNOSIS — M722 Plantar fascial fibromatosis: Secondary | ICD-10-CM | POA: Diagnosis not present

## 2024-08-18 DIAGNOSIS — M76822 Posterior tibial tendinitis, left leg: Secondary | ICD-10-CM

## 2024-08-18 DIAGNOSIS — M7672 Peroneal tendinitis, left leg: Secondary | ICD-10-CM

## 2024-08-18 DIAGNOSIS — M25572 Pain in left ankle and joints of left foot: Secondary | ICD-10-CM

## 2024-08-18 NOTE — Patient Instructions (Signed)
 Foot HEP   Recommend [employer] or similar store to look for plantar fascitis inserts   As needed follow up

## 2024-09-02 ENCOUNTER — Encounter: Payer: Self-pay | Admitting: Family
